# Patient Record
Sex: Female | Born: 1955 | Race: White | Hispanic: No | Marital: Single | Smoking: Former smoker
Health system: Southern US, Community
[De-identification: ages and names within clinical notes are randomized; demographics above are authoritative.]

## PROBLEM LIST (undated history)

## (undated) DIAGNOSIS — G4730 Sleep apnea, unspecified: Secondary | ICD-10-CM

## (undated) DIAGNOSIS — J449 Chronic obstructive pulmonary disease, unspecified: Secondary | ICD-10-CM

## (undated) DIAGNOSIS — J45909 Unspecified asthma, uncomplicated: Secondary | ICD-10-CM

## (undated) DIAGNOSIS — K863 Pseudocyst of pancreas: Secondary | ICD-10-CM

## (undated) DIAGNOSIS — F319 Bipolar disorder, unspecified: Secondary | ICD-10-CM

## (undated) DIAGNOSIS — K219 Gastro-esophageal reflux disease without esophagitis: Secondary | ICD-10-CM

## (undated) DIAGNOSIS — J869 Pyothorax without fistula: Secondary | ICD-10-CM

## (undated) DIAGNOSIS — F329 Major depressive disorder, single episode, unspecified: Secondary | ICD-10-CM

## (undated) DIAGNOSIS — T7840XA Allergy, unspecified, initial encounter: Secondary | ICD-10-CM

## (undated) DIAGNOSIS — N3010 Interstitial cystitis (chronic) without hematuria: Secondary | ICD-10-CM

## (undated) DIAGNOSIS — F32A Depression, unspecified: Secondary | ICD-10-CM

## (undated) DIAGNOSIS — I10 Essential (primary) hypertension: Secondary | ICD-10-CM

## (undated) DIAGNOSIS — J189 Pneumonia, unspecified organism: Secondary | ICD-10-CM

## (undated) DIAGNOSIS — F4310 Post-traumatic stress disorder, unspecified: Secondary | ICD-10-CM

## (undated) DIAGNOSIS — E119 Type 2 diabetes mellitus without complications: Secondary | ICD-10-CM

## (undated) HISTORY — DX: Pseudocyst of pancreas: K86.3

## (undated) HISTORY — DX: Interstitial cystitis (chronic) without hematuria: N30.10

## (undated) HISTORY — DX: Sleep apnea, unspecified: G47.30

## (undated) HISTORY — DX: Pneumonia, unspecified organism: J18.9

## (undated) HISTORY — PX: LEG SURGERY: SHX1003

## (undated) HISTORY — PX: CHEST SURGERY: SHX595

## (undated) HISTORY — DX: Depression, unspecified: F32.A

## (undated) HISTORY — DX: Type 2 diabetes mellitus without complications: E11.9

## (undated) HISTORY — DX: Gastro-esophageal reflux disease without esophagitis: K21.9

## (undated) HISTORY — PX: CHOLECYSTECTOMY: SHX55

## (undated) HISTORY — DX: Essential (primary) hypertension: I10

## (undated) HISTORY — DX: Allergy, unspecified, initial encounter: T78.40XA

## (undated) HISTORY — DX: Major depressive disorder, single episode, unspecified: F32.9

## (undated) HISTORY — DX: Unspecified asthma, uncomplicated: J45.909

---

## 1998-08-09 ENCOUNTER — Emergency Department (HOSPITAL_COMMUNITY): Admission: EM | Admit: 1998-08-09 | Discharge: 1998-08-09 | Payer: Self-pay

## 1998-09-29 ENCOUNTER — Emergency Department (HOSPITAL_COMMUNITY): Admission: EM | Admit: 1998-09-29 | Discharge: 1998-09-29 | Payer: Self-pay

## 1999-06-10 ENCOUNTER — Other Ambulatory Visit: Admission: RE | Admit: 1999-06-10 | Discharge: 1999-06-10 | Payer: Self-pay | Admitting: Family Medicine

## 2000-06-29 ENCOUNTER — Encounter: Payer: Self-pay | Admitting: Urology

## 2000-07-06 ENCOUNTER — Ambulatory Visit (HOSPITAL_COMMUNITY): Admission: RE | Admit: 2000-07-06 | Discharge: 2000-07-06 | Payer: Self-pay | Admitting: Urology

## 2000-09-12 ENCOUNTER — Other Ambulatory Visit: Admission: RE | Admit: 2000-09-12 | Discharge: 2000-09-12 | Payer: Self-pay | Admitting: Obstetrics and Gynecology

## 2000-10-22 ENCOUNTER — Emergency Department (HOSPITAL_COMMUNITY): Admission: EM | Admit: 2000-10-22 | Discharge: 2000-10-22 | Payer: Self-pay | Admitting: Emergency Medicine

## 2004-02-19 ENCOUNTER — Ambulatory Visit (HOSPITAL_BASED_OUTPATIENT_CLINIC_OR_DEPARTMENT_OTHER): Admission: RE | Admit: 2004-02-19 | Discharge: 2004-02-19 | Payer: Self-pay | Admitting: Urology

## 2004-02-19 ENCOUNTER — Ambulatory Visit (HOSPITAL_COMMUNITY): Admission: RE | Admit: 2004-02-19 | Discharge: 2004-02-19 | Payer: Self-pay | Admitting: Urology

## 2004-12-29 ENCOUNTER — Ambulatory Visit: Payer: Self-pay | Admitting: Family Medicine

## 2005-09-09 ENCOUNTER — Ambulatory Visit: Payer: Self-pay | Admitting: Family Medicine

## 2005-10-27 ENCOUNTER — Ambulatory Visit: Payer: Self-pay | Admitting: Family Medicine

## 2005-10-29 ENCOUNTER — Ambulatory Visit: Payer: Self-pay | Admitting: Family Medicine

## 2005-11-10 ENCOUNTER — Ambulatory Visit: Payer: Self-pay | Admitting: Family Medicine

## 2005-11-17 ENCOUNTER — Ambulatory Visit: Payer: Self-pay | Admitting: Family Medicine

## 2005-12-30 ENCOUNTER — Ambulatory Visit: Payer: Self-pay | Admitting: Family Medicine

## 2006-08-11 ENCOUNTER — Ambulatory Visit: Payer: Self-pay | Admitting: Family Medicine

## 2007-03-07 ENCOUNTER — Ambulatory Visit: Payer: Self-pay | Admitting: Family Medicine

## 2007-05-04 ENCOUNTER — Ambulatory Visit: Payer: Self-pay | Admitting: Family Medicine

## 2007-06-27 ENCOUNTER — Telehealth: Payer: Self-pay | Admitting: *Deleted

## 2007-07-20 ENCOUNTER — Telehealth: Payer: Self-pay | Admitting: Family Medicine

## 2007-09-02 DIAGNOSIS — J309 Allergic rhinitis, unspecified: Secondary | ICD-10-CM | POA: Insufficient documentation

## 2007-09-02 DIAGNOSIS — F329 Major depressive disorder, single episode, unspecified: Secondary | ICD-10-CM

## 2007-09-02 DIAGNOSIS — N3010 Interstitial cystitis (chronic) without hematuria: Secondary | ICD-10-CM | POA: Insufficient documentation

## 2007-09-02 DIAGNOSIS — K219 Gastro-esophageal reflux disease without esophagitis: Secondary | ICD-10-CM

## 2007-09-02 DIAGNOSIS — J45909 Unspecified asthma, uncomplicated: Secondary | ICD-10-CM

## 2007-09-02 DIAGNOSIS — I10 Essential (primary) hypertension: Secondary | ICD-10-CM

## 2007-12-13 ENCOUNTER — Telehealth: Payer: Self-pay | Admitting: Family Medicine

## 2008-02-13 ENCOUNTER — Telehealth: Payer: Self-pay | Admitting: Family Medicine

## 2008-03-21 ENCOUNTER — Ambulatory Visit: Payer: Self-pay | Admitting: Family Medicine

## 2008-03-21 DIAGNOSIS — G4700 Insomnia, unspecified: Secondary | ICD-10-CM

## 2008-03-25 LAB — CONVERTED CEMR LAB
ALT: 16 units/L (ref 0–35)
AST: 24 units/L (ref 0–37)
Albumin: 3.4 g/dL — ABNORMAL LOW (ref 3.5–5.2)
Alkaline Phosphatase: 78 units/L (ref 39–117)
BUN: 11 mg/dL (ref 6–23)
Basophils Absolute: 0 10*3/uL (ref 0.0–0.1)
Basophils Relative: 0.8 % (ref 0.0–1.0)
Bilirubin, Direct: 0.1 mg/dL (ref 0.0–0.3)
CO2: 31 meq/L (ref 19–32)
Calcium: 10.1 mg/dL (ref 8.4–10.5)
Chloride: 107 meq/L (ref 96–112)
Cholesterol: 201 mg/dL (ref 0–200)
Creatinine, Ser: 0.9 mg/dL (ref 0.4–1.2)
Direct LDL: 151.4 mg/dL
Eosinophils Absolute: 0.1 10*3/uL (ref 0.0–0.7)
Eosinophils Relative: 2.6 % (ref 0.0–5.0)
GFR calc Af Amer: 86 mL/min
GFR calc non Af Amer: 71 mL/min
Glucose, Bld: 87 mg/dL (ref 70–99)
HCT: 39.3 % (ref 36.0–46.0)
HDL: 46.4 mg/dL (ref >39.0)
Hemoglobin: 13.2 g/dL (ref 12.0–15.0)
Lymphocytes Relative: 33.5 % (ref 12.0–46.0)
MCHC: 33.7 g/dL (ref 30.0–36.0)
MCV: 82.2 fL (ref 78.0–100.0)
Monocytes Absolute: 0.5 10*3/uL (ref 0.1–1.0)
Monocytes Relative: 8.8 % (ref 3.0–12.0)
Neutro Abs: 3.2 10*3/uL (ref 1.4–7.7)
Neutrophils Relative %: 54.3 % (ref 43.0–77.0)
Platelets: 349 10*3/uL (ref 150–400)
Potassium: 4.8 meq/L (ref 3.5–5.1)
RBC: 4.78 M/uL (ref 3.87–5.11)
RDW: 13.7 % (ref 11.5–14.6)
Sodium: 146 meq/L — ABNORMAL HIGH (ref 135–145)
TSH: 2.56 microintl units/mL (ref 0.35–5.50)
Total Bilirubin: 0.5 mg/dL (ref 0.3–1.2)
Total CHOL/HDL Ratio: 4.3
Total Protein: 7.8 g/dL (ref 6.0–8.3)
Triglycerides: 65 mg/dL (ref 0–149)
VLDL: 13 mg/dL (ref 0–40)
WBC: 5.7 10*3/uL (ref 4.5–10.5)

## 2008-08-14 ENCOUNTER — Encounter: Payer: Self-pay | Admitting: Family Medicine

## 2008-09-05 ENCOUNTER — Ambulatory Visit: Payer: Self-pay | Admitting: Family Medicine

## 2008-09-05 DIAGNOSIS — R609 Edema, unspecified: Secondary | ICD-10-CM | POA: Insufficient documentation

## 2008-09-06 LAB — CONVERTED CEMR LAB
ALT: 18 units/L (ref 0–35)
AST: 31 units/L (ref 0–37)
Albumin: 3.8 g/dL (ref 3.5–5.2)
Alkaline Phosphatase: 76 units/L (ref 39–117)
BUN: 17 mg/dL (ref 6–23)
Basophils Absolute: 0 10*3/uL (ref 0.0–0.1)
Basophils Relative: 0.7 % (ref 0.0–3.0)
Bilirubin, Direct: 0.1 mg/dL (ref 0.0–0.3)
CO2: 32 meq/L (ref 19–32)
Calcium: 9.9 mg/dL (ref 8.4–10.5)
Chloride: 100 meq/L (ref 96–112)
Creatinine, Ser: 1.1 mg/dL (ref 0.4–1.2)
Eosinophils Absolute: 0.1 10*3/uL (ref 0.0–0.7)
Eosinophils Relative: 1.9 % (ref 0.0–5.0)
GFR calc Af Amer: 68 mL/min
GFR calc non Af Amer: 56 mL/min
Glucose, Bld: 88 mg/dL (ref 70–99)
HCT: 41.1 % (ref 36.0–46.0)
Hemoglobin: 14 g/dL (ref 12.0–15.0)
Lymphocytes Relative: 30.4 % (ref 12.0–46.0)
MCHC: 34.1 g/dL (ref 30.0–36.0)
MCV: 83.6 fL (ref 78.0–100.0)
Monocytes Absolute: 0.7 10*3/uL (ref 0.1–1.0)
Monocytes Relative: 12.4 % — ABNORMAL HIGH (ref 3.0–12.0)
Neutro Abs: 3 10*3/uL (ref 1.4–7.7)
Neutrophils Relative %: 54.6 % (ref 43.0–77.0)
Platelets: 328 10*3/uL (ref 150–400)
Potassium: 3.9 meq/L (ref 3.5–5.1)
RBC: 4.91 M/uL (ref 3.87–5.11)
RDW: 13.2 % (ref 11.5–14.6)
Sodium: 145 meq/L (ref 135–145)
TSH: 2.28 microintl units/mL (ref 0.35–5.50)
Total Bilirubin: 0.5 mg/dL (ref 0.3–1.2)
Total Protein: 8.9 g/dL — ABNORMAL HIGH (ref 6.0–8.3)
WBC: 5.5 10*3/uL (ref 4.5–10.5)

## 2008-11-27 ENCOUNTER — Encounter: Payer: Self-pay | Admitting: Family Medicine

## 2009-01-21 ENCOUNTER — Telehealth: Payer: Self-pay | Admitting: Family Medicine

## 2009-04-14 ENCOUNTER — Telehealth: Payer: Self-pay | Admitting: Family Medicine

## 2009-04-22 ENCOUNTER — Telehealth: Payer: Self-pay | Admitting: Family Medicine

## 2009-04-25 ENCOUNTER — Telehealth: Payer: Self-pay | Admitting: Family Medicine

## 2009-06-02 ENCOUNTER — Ambulatory Visit: Payer: Self-pay | Admitting: Family Medicine

## 2009-06-02 DIAGNOSIS — M722 Plantar fascial fibromatosis: Secondary | ICD-10-CM

## 2010-06-23 ENCOUNTER — Ambulatory Visit: Payer: Self-pay | Admitting: Family Medicine

## 2010-06-24 LAB — CONVERTED CEMR LAB
ALT: 13 units/L (ref 0–35)
AST: 18 units/L (ref 0–37)
Albumin: 3.6 g/dL (ref 3.5–5.2)
Alkaline Phosphatase: 72 units/L (ref 39–117)
BUN: 16 mg/dL (ref 6–23)
Basophils Absolute: 0 10*3/uL (ref 0.0–0.1)
Basophils Relative: 0.5 % (ref 0.0–3.0)
Bilirubin, Direct: 0.1 mg/dL (ref 0.0–0.3)
CO2: 31 meq/L (ref 19–32)
Calcium: 9.7 mg/dL (ref 8.4–10.5)
Chloride: 109 meq/L (ref 96–112)
Cholesterol: 190 mg/dL (ref 0–200)
Creatinine, Ser: 0.7 mg/dL (ref 0.4–1.2)
Eosinophils Absolute: 0.1 10*3/uL (ref 0.0–0.7)
Eosinophils Relative: 1.8 % (ref 0.0–5.0)
GFR calc non Af Amer: 91.82 mL/min (ref >60)
Glucose, Bld: 89 mg/dL (ref 70–99)
HCT: 39 % (ref 36.0–46.0)
HDL: 49.7 mg/dL (ref >39.00)
Hemoglobin: 13 g/dL (ref 12.0–15.0)
LDL Cholesterol: 129 mg/dL — ABNORMAL HIGH (ref 0–99)
Lymphocytes Relative: 33 % (ref 12.0–46.0)
Lymphs Abs: 2.4 10*3/uL (ref 0.7–4.0)
MCHC: 33.4 g/dL (ref 30.0–36.0)
MCV: 83.4 fL (ref 78.0–100.0)
Monocytes Absolute: 0.6 10*3/uL (ref 0.1–1.0)
Monocytes Relative: 8.1 % (ref 3.0–12.0)
Neutro Abs: 4.1 10*3/uL (ref 1.4–7.7)
Neutrophils Relative %: 56.6 % (ref 43.0–77.0)
Platelets: 297 10*3/uL (ref 150.0–400.0)
Potassium: 4.6 meq/L (ref 3.5–5.1)
RBC: 4.67 M/uL (ref 3.87–5.11)
RDW: 14.9 % — ABNORMAL HIGH (ref 11.5–14.6)
Sodium: 143 meq/L (ref 135–145)
TSH: 3.41 microintl units/mL (ref 0.35–5.50)
Total Bilirubin: 0.3 mg/dL (ref 0.3–1.2)
Total CHOL/HDL Ratio: 4
Total Protein: 7.9 g/dL (ref 6.0–8.3)
Triglycerides: 59 mg/dL (ref 0.0–149.0)
VLDL: 11.8 mg/dL (ref 0.0–40.0)
Vitamin B-12: 339 pg/mL (ref 211–911)
WBC: 7.2 10*3/uL (ref 4.5–10.5)

## 2011-01-07 NOTE — Assessment & Plan Note (Signed)
Summary: med check/refill/cjr pt rsc due to transportation/njr   Vital Signs:  Patient profile:   55 year old female Weight:      202 pounds Pulse rate:   56 / minute Pulse rhythm:   regular BP sitting:   130 / 90  (left arm) Cuff size:   regular  Vitals Entered By: Raechel Ache, RN (June 23, 2010 9:48 AM) CC: Out of meds x 2 months. C/o pain in back, legs, feet and can't feel toes.   History of Present Illness: Here to follow up after a little over a year. She is doing well, her asthma is well controlled. She still has some chronic low back pain. She also mentions some numbness in the feet for several months. She is fasting today.   Allergies: 1)  ! Penicillin V Potassium (Penicillin V Potassium) 2)  ! Aspirin 3)  ! Sulfamethoxazole (Sulfamethoxazole) 4)  ! Betadine (Povidone-Iodine) 5)  ! Adhesive Bandages (Adhesive Bandages)  Past History:  Past Medical History: Allergic rhinitis Asthma Depression, sees Dr. Lum Babe (psychiatry) in [facility] GERD Hypertension interstitial cystitis pneumonia  Past Surgical History: Reviewed history from 03/21/2008 and no changes required. Denies surgical history  Review of Systems  The patient denies anorexia, fever, weight loss, weight gain, vision loss, decreased hearing, hoarseness, chest pain, syncope, dyspnea on exertion, peripheral edema, prolonged cough, headaches, hemoptysis, abdominal pain, melena, hematochezia, severe indigestion/heartburn, hematuria, incontinence, genital sores, muscle weakness, suspicious skin lesions, transient blindness, difficulty walking, depression, unusual weight change, abnormal bleeding, enlarged lymph nodes, angioedema, breast masses, and testicular masses.    Physical Exam  General:  overweight-appearing.   Neck:  No deformities, masses, or tenderness noted. Lungs:  Normal respiratory effort, chest expands symmetrically. Lungs are clear to auscultation, no crackles or  wheezes. Heart:  Normal rate and regular rhythm. S1 and S2 normal without gallop, murmur, click, rub or other extra sounds. Extremities:  no edema Neurologic:  alert & oriented X3 and gait normal.     Impression & Recommendations:  Problem # 1:  LEG EDEMA (ICD-782.3)  Her updated medication list for this problem includes:    Diovan Hct 160-25 Mg Tabs (Valsartan-hydrochlorothiazide) .Marland Kitchen... 1 by mouth once daily    Lasix 20 Mg Tabs (Furosemide) ..... Once daily  Problem # 2:  HYPERTENSION (ICD-401.9)  Her updated medication list for this problem includes:    Diovan Hct 160-25 Mg Tabs (Valsartan-hydrochlorothiazide) .Marland Kitchen... 1 by mouth once daily    Amlodipine Besylate 5 Mg Tabs (Amlodipine besylate) .Marland Kitchen... 1 by mouth every day    Lasix 20 Mg Tabs (Furosemide) ..... Once daily  Orders: Prescription Created Electronically [phone]) Venipuncture [id number]) TLB-Lipid Panel (80061-LIPID) TLB-BMP (Basic Metabolic Panel-BMET) (80048-METABOL) TLB-CBC Platelet - w/Differential (85025-CBCD) TLB-Hepatic/Liver Function Pnl (80076-HEPATIC) TLB-TSH (Thyroid Stimulating Hormone) (84443-TSH) TLB-B12, Serum-Total ONLY (09811-B14)  Problem # 3:  GERD (ICD-530.81)  Her updated medication list for this problem includes:    Nexium 40 Mg Cpdr (Esomeprazole magnesium) .Marland Kitchen... 1 by mouth once daily  Problem # 4:  ASTHMA (ICD-493.90)  Her updated medication list for this problem includes:    Singulair 10 Mg Tabs (Montelukast sodium) .Marland Kitchen... Take 1 tablet by mouth once a day    Advair Hfa 115-21 Mcg/act Aero (Fluticasone-salmeterol) .Marland Kitchen..Marland Kitchen Two times a day    Albuterol-ipratropium 2.5-0.5 Mg/76ml Soln (Albuterol-ipratropium) .Marland Kitchen... As needed  nebulizer    Terbutaline Sulfate 1 Mg/ml Soln (Terbutaline sulfate) .Marland Kitchen... As needed for nebulizer  Problem # 5:  ALLERGIC RHINITIS (ICD-477.9)  Her  updated medication list for this problem includes:    Nasonex 50 Mcg/act Susp (Mometasone furoate) .Marland Kitchen... 2 sprays each nostril  once daily  Complete Medication List: 1)  Diovan Hct 160-25 Mg Tabs (Valsartan-hydrochlorothiazide) .Marland Kitchen.. 1 by mouth once daily 2)  Halobetasol Propionate 0.05 % Crea (Halobetasol propionate) .... As needed 3)  Vicodin 5-500 Mg Tabs (Hydrocodone-acetaminophen) .Marland Kitchen.. 1 every 6 hours as needed pain 4)  Zazole 0.4 % Crea (Terconazole) .... As needed 5)  Singulair 10 Mg Tabs (Montelukast sodium) .... Take 1 tablet by mouth once a day 6)  Nasonex 50 Mcg/act Susp (Mometasone furoate) .... 2 sprays each nostril once daily 7)  Advair Hfa 115-21 Mcg/act Aero (Fluticasone-salmeterol) .... Two times a day 8)  Nexium 40 Mg Cpdr (Esomeprazole magnesium) .Marland Kitchen.. 1 by mouth once daily 9)  Amlodipine Besylate 5 Mg Tabs (Amlodipine besylate) .Marland Kitchen.. 1 by mouth every day 10)  Albuterol-ipratropium 2.5-0.5 Mg/46ml Soln (Albuterol-ipratropium) .... As needed  nebulizer 11)  Ambien 10 Mg Tabs (Zolpidem tartrate) .Marland Kitchen.. 1 by mouth at bedtime 12)  Diclofenac Sodium 75 Mg Tbec (Diclofenac sodium) .... Two times a day 13)  Pamelor 50 Mg Caps (Nortriptyline hcl) .... At bedtime 14)  Vesicare 5 Mg Tabs (Solifenacin succinate) .Marland Kitchen.. 1 by mouth daily 15)  Lasix 20 Mg Tabs (Furosemide) .... Once daily 16)  Terbutaline Sulfate 1 Mg/ml Soln (Terbutaline sulfate) .... As needed for nebulizer 17)  Zoloft 100 Mg Tabs (Sertraline hcl) .Marland Kitchen.. 1 once daily  Patient Instructions: 1)  get labs today Prescriptions: TERBUTALINE SULFATE 1 MG/ML SOLN (TERBUTALINE SULFATE) as needed for nebulizer  #30 x 11   Entered and Authorized by:   Nelwyn Salisbury MD   Signed by:   Nelwyn Salisbury MD on 06/23/2010   Method used:   Electronically to        [facility]. [phone]* (retail)       [address]       [place], [place]  [address]       Ph: [phone] or [phone]       Fax: [phone]   RxID:   [id number] LASIX 20 MG TABS (FUROSEMIDE) once daily  #30 x 11   Entered and Authorized by:   Nelwyn Salisbury  MD   Signed by:   Nelwyn Salisbury MD on 06/23/2010   Method used:   Electronically to        [facility]. [phone]* (retail)       [address]       [place], [place]  [address]       Ph: [phone] or [phone]       Fax: [phone]   RxID:   [id number] VESICARE 5 MG TABS (SOLIFENACIN SUCCINATE) 1 by mouth daily  #30 x 11   Entered and Authorized by:   Nelwyn Salisbury MD   Signed by:   Nelwyn Salisbury MD on 06/23/2010   Method used:   Electronically to        [facility]. [phone]* (retail)       [address]       [place], [place]  [address]       Ph: [phone] or [phone]       Fax: [phone]   RxID:   [id number] DICLOFENAC SODIUM 75 MG  TBEC (DICLOFENAC SODIUM) two  times a day  #60 x 11   Entered and Authorized by:   Nelwyn Salisbury MD   Signed by:   Nelwyn Salisbury MD on 06/23/2010   Method used:   Electronically to        [facility]. [phone]* (retail)       [address]       [place], [place]  [address]       Ph: [phone] or [phone]       Fax: [phone]   RxID:   [id number] AMLODIPINE BESYLATE 5 MG  TABS (AMLODIPINE BESYLATE) 1 by mouth every day  #30 x 11   Entered and Authorized by:   Nelwyn Salisbury MD   Signed by:   Nelwyn Salisbury MD on 06/23/2010   Method used:   Electronically to        [facility] [phone]* (retail)       [address]       [place], [place]  [address]       Ph: [phone] or [phone]       Fax: [phone]   RxID:   [id number] NEXIUM 40 MG  CPDR (ESOMEPRAZOLE MAGNESIUM) 1 by mouth once daily  #30 x 11   Entered and Authorized by:   Nelwyn Salisbury MD   Signed by:   Nelwyn Salisbury MD on 06/23/2010   Method used:   Electronically to        [facility]. [phone]* (retail)       [address]       [place], [place]   [address]       Ph: [phone] or [phone]       Fax: [phone]   RxID:   [id number] ADVAIR HFA 115-21 MCG/ACT  AERO (FLUTICASONE-SALMETEROL) two times a day  #30 x 11   Entered and Authorized by:   Nelwyn Salisbury MD   Signed by:   Nelwyn Salisbury MD on 06/23/2010   Method used:   Electronically to        [facility]. [phone]* (retail)       [address]       [place], [place]  [address]       Ph: [phone] or [phone]       Fax: [phone]   RxID:   [id number] NASONEX 50 MCG/ACT  SUSP (MOMETASONE FUROATE) 2 sprays each nostril once daily  #30 x 11   Entered and Authorized by:   Nelwyn Salisbury MD   Signed by:   Nelwyn Salisbury MD on 06/23/2010   Method used:   Electronically to        [facility]. [phone]* (retail)       [address]       [place], [place]  [address]       Ph: [phone] or [phone]       Fax: [phone]   RxID:   [id number] SINGULAIR 10 MG  TABS (MONTELUKAST SODIUM) Take 1 tablet by mouth once a day  #30 x 11   Entered and Authorized by:   Nelwyn Salisbury MD   Signed by:   Nelwyn Salisbury MD on 06/23/2010   Method used:  Electronically to        [facility]. [phone]* (retail)       [address]       [place], [place]  [address]       Ph: [phone] or [phone]       Fax: [phone]   RxID:   [id number] DIOVAN HCT 160-25 MG TABS (VALSARTAN-HYDROCHLOROTHIAZIDE) 1 by mouth once daily  #30 x 11   Entered and Authorized by:   Nelwyn Salisbury MD   Signed by:   Nelwyn Salisbury MD on 06/23/2010   Method used:   Electronically to        [facility] [phone]* (retail)       [address]       [place], [place]  [address]       Ph: [phone] or [phone]       Fax: [phone]   RxID:   [id number]

## 2011-04-23 NOTE — Op Note (Signed)
[name], Isabel [name]                       ACCOUNT NO.:  [id number]   MEDICAL RECORD NO.:  [id number]                   PATIENT TYPE:  AMB   LOCATION:  [facility]                                 FACILITY:  [facility]   PHYSICIAN:  Valetta Fuller, M.D.               DATE OF BIRTH:  02-13-1956   DATE OF PROCEDURE:  02/19/2004  DATE OF DISCHARGE:                                 OPERATIVE REPORT   PREOPERATIVE DIAGNOSIS:  Interstitial cystitis.   POSTOPERATIVE DIAGNOSIS:  Interstitial cystitis.   PROCEDURE PERFORMED:  1. Cystoscopy.  2. Urethral dilation.  3. Hydraulic overdistention of the bladder.  4. Instillation of Clorpactin.   SURGEON:  Valetta Fuller, M.D.   ANESTHESIA:  General.   INDICATIONS:  [name] is a 55 year old female with a longstanding  history of interstitial cystitis.  She has had several hydraulic  overdistentions of the bladder in the past.  She has been noted to have  significant glomerular hemorrhaging on several occasions.  She has responded  well to previous hydraulic overdistentions of her bladder.  She recently  came in with an increased flare-up of her interstitial cystitis and  therefore, requested a repeat hydraulic overdistention.  She is here for  that now.   TECHNIQUE AND FINDINGS:  The patient was brought to the operating room where  she had successful induction of general anesthesia.  She was prepped and  draped in the usual manner.  Urethral dilation was performed to 34 [place].  She then underwent cystoscopy.  This failed to reveal anything significant.  She then underwent hydraulic overdistention of her bladder to approximately  80-100 cm of water pressure.  This was held for 5 minutes.  At the  completion, her capacity was measured at approximately 800 mL.  She did have  diffuse 3+ glomerular hemorrhaging.  At the completion of the hydraulic  overdistention, we instilled in her bladder under a standard concentration  under gravity drainage.   She appeared to tolerate the procedure well.  There  were no obvious complications.                                               Valetta Fuller, M.D.    DSG/MEDQ  D:  02/19/2004  T:  02/20/2004  Job:  [id number]

## 2011-04-23 NOTE — Op Note (Signed)
[facility]  Patient:    Isabel [name] [facility]                     MRN: [id number] Proc. Date: 07/06/00 Adm. Date:  [id number] Attending:  Thermon Leyland                           Operative Report  PREOPERATIVE DIAGNOSIS:  Interstitial cystitis.  POSTOPERATIVE DIAGNOSIS:  Interstitial cystitis.  PROCEDURE PERFORMED:  Urethral dilation, cystoscopy, hydraulic over distention of the bladder, instillation of Clorpactin, Pyridium, and Marcaine.  SURGEON:  Barron Alvine, M.D.  ANESTHESIA:  General.  INDICATIONS:  The patient is a 55 year old female with history of interstitial cystitis.  She has had two cystoscopies with hydrodistentions in the past which have worked very well for her symptoms.  Her last procedure was two years ago.  She has had recurrent symptoms without evidence of infection, and presents now for a repeat procedure.  DESCRIPTION OF PROCEDURE AND FINDINGS:  The patient was brought to the operating room where she had the successful induction of general anesthesia. She was placed in the lithotomy position, prepped and draped in the usual manner.  Urethral dilation was performed ________.  Cystoscopy revealed unremarkable bladder mucosa.  She underwent hydrodistention to 30 cmH2O for five minutes.  Capacity was noted at 700 cc.  There were some glomerulations as there did appear to be a marked ______ in overall bladder appearance compared to previous hydraulic over distentions.  Her bladder was subjected to repeat hydraulic over distention for two minutes.  We then instilled Clorpactin 100 cc at a time via gravity drainage to wash her bladder with. Approximately 500 cc of Clorpactin was utilized in this manner.  We then rinsed her bladder with normal saline.  Pyridium and Marcaine was placed in the bladder to help with postoperative recovery. DD:  07/06/00 TD:  07/07/00 Job: [id number]

## 2011-12-13 ENCOUNTER — Telehealth: Payer: Self-pay | Admitting: *Deleted

## 2011-12-13 NOTE — Telephone Encounter (Signed)
Wrong chart entered

## 2012-01-06 DIAGNOSIS — E119 Type 2 diabetes mellitus without complications: Secondary | ICD-10-CM | POA: Diagnosis not present

## 2012-01-06 DIAGNOSIS — N3946 Mixed incontinence: Secondary | ICD-10-CM | POA: Diagnosis not present

## 2012-01-06 DIAGNOSIS — M546 Pain in thoracic spine: Secondary | ICD-10-CM | POA: Diagnosis not present

## 2012-01-06 DIAGNOSIS — J449 Chronic obstructive pulmonary disease, unspecified: Secondary | ICD-10-CM | POA: Diagnosis not present

## 2012-02-03 DIAGNOSIS — I10 Essential (primary) hypertension: Secondary | ICD-10-CM | POA: Diagnosis not present

## 2012-02-03 DIAGNOSIS — E782 Mixed hyperlipidemia: Secondary | ICD-10-CM | POA: Diagnosis not present

## 2012-02-03 DIAGNOSIS — E559 Vitamin D deficiency, unspecified: Secondary | ICD-10-CM | POA: Diagnosis not present

## 2012-02-03 DIAGNOSIS — R609 Edema, unspecified: Secondary | ICD-10-CM | POA: Diagnosis not present

## 2012-02-03 DIAGNOSIS — M546 Pain in thoracic spine: Secondary | ICD-10-CM | POA: Diagnosis not present

## 2012-02-03 DIAGNOSIS — E119 Type 2 diabetes mellitus without complications: Secondary | ICD-10-CM | POA: Diagnosis not present

## 2012-02-29 DIAGNOSIS — J309 Allergic rhinitis, unspecified: Secondary | ICD-10-CM | POA: Diagnosis not present

## 2012-02-29 DIAGNOSIS — E559 Vitamin D deficiency, unspecified: Secondary | ICD-10-CM | POA: Diagnosis not present

## 2012-02-29 DIAGNOSIS — G4730 Sleep apnea, unspecified: Secondary | ICD-10-CM | POA: Diagnosis not present

## 2012-02-29 DIAGNOSIS — R0989 Other specified symptoms and signs involving the circulatory and respiratory systems: Secondary | ICD-10-CM | POA: Diagnosis not present

## 2012-03-28 DIAGNOSIS — G4733 Obstructive sleep apnea (adult) (pediatric): Secondary | ICD-10-CM | POA: Diagnosis not present

## 2012-05-17 DIAGNOSIS — F332 Major depressive disorder, recurrent severe without psychotic features: Secondary | ICD-10-CM | POA: Diagnosis not present

## 2012-06-13 DIAGNOSIS — E559 Vitamin D deficiency, unspecified: Secondary | ICD-10-CM | POA: Diagnosis not present

## 2012-06-13 DIAGNOSIS — G4710 Hypersomnia, unspecified: Secondary | ICD-10-CM | POA: Diagnosis not present

## 2012-06-13 DIAGNOSIS — R0609 Other forms of dyspnea: Secondary | ICD-10-CM | POA: Diagnosis not present

## 2012-06-13 DIAGNOSIS — G4730 Sleep apnea, unspecified: Secondary | ICD-10-CM | POA: Diagnosis not present

## 2012-06-13 DIAGNOSIS — R0989 Other specified symptoms and signs involving the circulatory and respiratory systems: Secondary | ICD-10-CM | POA: Diagnosis not present

## 2012-06-13 DIAGNOSIS — K219 Gastro-esophageal reflux disease without esophagitis: Secondary | ICD-10-CM | POA: Diagnosis not present

## 2012-06-14 DIAGNOSIS — G4710 Hypersomnia, unspecified: Secondary | ICD-10-CM | POA: Diagnosis not present

## 2012-06-14 DIAGNOSIS — E559 Vitamin D deficiency, unspecified: Secondary | ICD-10-CM | POA: Diagnosis not present

## 2012-06-14 DIAGNOSIS — R0609 Other forms of dyspnea: Secondary | ICD-10-CM | POA: Diagnosis not present

## 2012-06-14 DIAGNOSIS — K219 Gastro-esophageal reflux disease without esophagitis: Secondary | ICD-10-CM | POA: Diagnosis not present

## 2012-06-26 DIAGNOSIS — R209 Unspecified disturbances of skin sensation: Secondary | ICD-10-CM | POA: Diagnosis not present

## 2012-06-26 DIAGNOSIS — E119 Type 2 diabetes mellitus without complications: Secondary | ICD-10-CM | POA: Diagnosis not present

## 2012-06-26 DIAGNOSIS — G609 Hereditary and idiopathic neuropathy, unspecified: Secondary | ICD-10-CM | POA: Diagnosis not present

## 2012-06-26 DIAGNOSIS — M5137 Other intervertebral disc degeneration, lumbosacral region: Secondary | ICD-10-CM | POA: Diagnosis not present

## 2012-06-26 DIAGNOSIS — E782 Mixed hyperlipidemia: Secondary | ICD-10-CM | POA: Diagnosis not present

## 2012-06-26 DIAGNOSIS — I10 Essential (primary) hypertension: Secondary | ICD-10-CM | POA: Diagnosis not present

## 2012-06-26 DIAGNOSIS — E559 Vitamin D deficiency, unspecified: Secondary | ICD-10-CM | POA: Diagnosis not present

## 2012-06-26 DIAGNOSIS — J449 Chronic obstructive pulmonary disease, unspecified: Secondary | ICD-10-CM | POA: Diagnosis not present

## 2012-07-21 DIAGNOSIS — G4710 Hypersomnia, unspecified: Secondary | ICD-10-CM | POA: Diagnosis not present

## 2012-07-21 DIAGNOSIS — G4730 Sleep apnea, unspecified: Secondary | ICD-10-CM | POA: Diagnosis not present

## 2012-07-21 DIAGNOSIS — E559 Vitamin D deficiency, unspecified: Secondary | ICD-10-CM | POA: Diagnosis not present

## 2012-07-21 DIAGNOSIS — R0609 Other forms of dyspnea: Secondary | ICD-10-CM | POA: Diagnosis not present

## 2012-07-21 DIAGNOSIS — R0989 Other specified symptoms and signs involving the circulatory and respiratory systems: Secondary | ICD-10-CM | POA: Diagnosis not present

## 2012-07-21 DIAGNOSIS — K219 Gastro-esophageal reflux disease without esophagitis: Secondary | ICD-10-CM | POA: Diagnosis not present

## 2012-08-24 DIAGNOSIS — R112 Nausea with vomiting, unspecified: Secondary | ICD-10-CM | POA: Diagnosis not present

## 2012-08-24 DIAGNOSIS — Z6829 Body mass index (BMI) 29.0-29.9, adult: Secondary | ICD-10-CM | POA: Diagnosis not present

## 2012-08-24 DIAGNOSIS — E119 Type 2 diabetes mellitus without complications: Secondary | ICD-10-CM | POA: Diagnosis not present

## 2012-08-24 DIAGNOSIS — J45909 Unspecified asthma, uncomplicated: Secondary | ICD-10-CM | POA: Diagnosis present

## 2012-08-24 DIAGNOSIS — F329 Major depressive disorder, single episode, unspecified: Secondary | ICD-10-CM | POA: Diagnosis present

## 2012-08-24 DIAGNOSIS — R9389 Abnormal findings on diagnostic imaging of other specified body structures: Secondary | ICD-10-CM | POA: Diagnosis not present

## 2012-08-24 DIAGNOSIS — R1011 Right upper quadrant pain: Secondary | ICD-10-CM | POA: Diagnosis not present

## 2012-08-24 DIAGNOSIS — Z87891 Personal history of nicotine dependence: Secondary | ICD-10-CM | POA: Diagnosis not present

## 2012-08-24 DIAGNOSIS — R1110 Vomiting, unspecified: Secondary | ICD-10-CM | POA: Diagnosis not present

## 2012-08-24 DIAGNOSIS — F411 Generalized anxiety disorder: Secondary | ICD-10-CM | POA: Diagnosis present

## 2012-08-24 DIAGNOSIS — F0390 Unspecified dementia without behavioral disturbance: Secondary | ICD-10-CM | POA: Diagnosis not present

## 2012-08-24 DIAGNOSIS — K862 Cyst of pancreas: Secondary | ICD-10-CM | POA: Diagnosis not present

## 2012-08-24 DIAGNOSIS — R109 Unspecified abdominal pain: Secondary | ICD-10-CM | POA: Diagnosis not present

## 2012-08-24 DIAGNOSIS — G4730 Sleep apnea, unspecified: Secondary | ICD-10-CM | POA: Diagnosis present

## 2012-08-24 DIAGNOSIS — R634 Abnormal weight loss: Secondary | ICD-10-CM | POA: Diagnosis present

## 2012-08-24 DIAGNOSIS — I10 Essential (primary) hypertension: Secondary | ICD-10-CM | POA: Diagnosis not present

## 2012-08-24 DIAGNOSIS — K859 Acute pancreatitis without necrosis or infection, unspecified: Secondary | ICD-10-CM | POA: Diagnosis not present

## 2012-08-24 DIAGNOSIS — N318 Other neuromuscular dysfunction of bladder: Secondary | ICD-10-CM | POA: Diagnosis not present

## 2012-08-24 DIAGNOSIS — R110 Nausea: Secondary | ICD-10-CM | POA: Diagnosis not present

## 2012-08-24 DIAGNOSIS — K863 Pseudocyst of pancreas: Secondary | ICD-10-CM | POA: Diagnosis present

## 2012-08-28 DIAGNOSIS — K862 Cyst of pancreas: Secondary | ICD-10-CM | POA: Diagnosis not present

## 2012-08-28 DIAGNOSIS — E119 Type 2 diabetes mellitus without complications: Secondary | ICD-10-CM | POA: Diagnosis not present

## 2012-08-28 DIAGNOSIS — K863 Pseudocyst of pancreas: Secondary | ICD-10-CM | POA: Diagnosis not present

## 2012-09-02 HISTORY — PX: OTHER SURGICAL HISTORY: SHX169

## 2012-09-11 DIAGNOSIS — J45909 Unspecified asthma, uncomplicated: Secondary | ICD-10-CM | POA: Diagnosis not present

## 2012-09-11 DIAGNOSIS — I10 Essential (primary) hypertension: Secondary | ICD-10-CM | POA: Diagnosis not present

## 2012-09-11 DIAGNOSIS — R32 Unspecified urinary incontinence: Secondary | ICD-10-CM | POA: Diagnosis not present

## 2012-09-11 DIAGNOSIS — E168 Other specified disorders of pancreatic internal secretion: Secondary | ICD-10-CM | POA: Diagnosis not present

## 2012-09-11 DIAGNOSIS — E119 Type 2 diabetes mellitus without complications: Secondary | ICD-10-CM | POA: Diagnosis not present

## 2012-09-15 DIAGNOSIS — I10 Essential (primary) hypertension: Secondary | ICD-10-CM | POA: Diagnosis not present

## 2012-09-15 DIAGNOSIS — E168 Other specified disorders of pancreatic internal secretion: Secondary | ICD-10-CM | POA: Diagnosis not present

## 2012-09-15 DIAGNOSIS — J45909 Unspecified asthma, uncomplicated: Secondary | ICD-10-CM | POA: Diagnosis not present

## 2012-09-15 DIAGNOSIS — E119 Type 2 diabetes mellitus without complications: Secondary | ICD-10-CM | POA: Diagnosis not present

## 2012-09-15 DIAGNOSIS — R32 Unspecified urinary incontinence: Secondary | ICD-10-CM | POA: Diagnosis not present

## 2012-09-19 DIAGNOSIS — E119 Type 2 diabetes mellitus without complications: Secondary | ICD-10-CM | POA: Diagnosis not present

## 2012-09-22 DIAGNOSIS — J45909 Unspecified asthma, uncomplicated: Secondary | ICD-10-CM | POA: Diagnosis not present

## 2012-09-22 DIAGNOSIS — R32 Unspecified urinary incontinence: Secondary | ICD-10-CM | POA: Diagnosis not present

## 2012-09-22 DIAGNOSIS — I10 Essential (primary) hypertension: Secondary | ICD-10-CM | POA: Diagnosis not present

## 2012-09-22 DIAGNOSIS — E119 Type 2 diabetes mellitus without complications: Secondary | ICD-10-CM | POA: Diagnosis not present

## 2012-09-22 DIAGNOSIS — E168 Other specified disorders of pancreatic internal secretion: Secondary | ICD-10-CM | POA: Diagnosis not present

## 2012-09-25 ENCOUNTER — Telehealth: Payer: Self-pay | Admitting: Family Medicine

## 2012-09-25 NOTE — Telephone Encounter (Signed)
Pt coming in for ov on 10/09/12. Pt has been in [facility]. If Dr [name] needs the records from pts hospital visit, pls call [phone] and ask for [facility].

## 2012-09-26 ENCOUNTER — Ambulatory Visit: Payer: Self-pay | Admitting: Family Medicine

## 2012-09-26 DIAGNOSIS — E168 Other specified disorders of pancreatic internal secretion: Secondary | ICD-10-CM | POA: Diagnosis not present

## 2012-09-26 DIAGNOSIS — I10 Essential (primary) hypertension: Secondary | ICD-10-CM | POA: Diagnosis not present

## 2012-09-26 DIAGNOSIS — R32 Unspecified urinary incontinence: Secondary | ICD-10-CM | POA: Diagnosis not present

## 2012-09-26 DIAGNOSIS — J45909 Unspecified asthma, uncomplicated: Secondary | ICD-10-CM | POA: Diagnosis not present

## 2012-09-26 DIAGNOSIS — E119 Type 2 diabetes mellitus without complications: Secondary | ICD-10-CM | POA: Diagnosis not present

## 2012-09-26 NOTE — Telephone Encounter (Signed)
I called and left voice message, yes we need copy of hospital visit and fax to [place].

## 2012-09-28 DIAGNOSIS — R32 Unspecified urinary incontinence: Secondary | ICD-10-CM | POA: Diagnosis not present

## 2012-09-28 DIAGNOSIS — E119 Type 2 diabetes mellitus without complications: Secondary | ICD-10-CM | POA: Diagnosis not present

## 2012-09-28 DIAGNOSIS — E168 Other specified disorders of pancreatic internal secretion: Secondary | ICD-10-CM | POA: Diagnosis not present

## 2012-09-28 DIAGNOSIS — J45909 Unspecified asthma, uncomplicated: Secondary | ICD-10-CM | POA: Diagnosis not present

## 2012-09-28 DIAGNOSIS — I10 Essential (primary) hypertension: Secondary | ICD-10-CM | POA: Diagnosis not present

## 2012-10-03 DIAGNOSIS — E119 Type 2 diabetes mellitus without complications: Secondary | ICD-10-CM | POA: Diagnosis not present

## 2012-10-03 DIAGNOSIS — I10 Essential (primary) hypertension: Secondary | ICD-10-CM | POA: Diagnosis not present

## 2012-10-03 DIAGNOSIS — R32 Unspecified urinary incontinence: Secondary | ICD-10-CM | POA: Diagnosis not present

## 2012-10-03 DIAGNOSIS — E168 Other specified disorders of pancreatic internal secretion: Secondary | ICD-10-CM | POA: Diagnosis not present

## 2012-10-03 DIAGNOSIS — J45909 Unspecified asthma, uncomplicated: Secondary | ICD-10-CM | POA: Diagnosis not present

## 2012-10-09 ENCOUNTER — Encounter: Payer: Self-pay | Admitting: Family Medicine

## 2012-10-09 ENCOUNTER — Ambulatory Visit (INDEPENDENT_AMBULATORY_CARE_PROVIDER_SITE_OTHER): Payer: Medicare Other | Admitting: Family Medicine

## 2012-10-09 VITALS — BP 112/70 | HR 65 | Temp 98.1°F | Wt 166.0 lb

## 2012-10-09 DIAGNOSIS — E1169 Type 2 diabetes mellitus with other specified complication: Secondary | ICD-10-CM

## 2012-10-09 DIAGNOSIS — E669 Obesity, unspecified: Secondary | ICD-10-CM

## 2012-10-09 DIAGNOSIS — G4730 Sleep apnea, unspecified: Secondary | ICD-10-CM | POA: Diagnosis not present

## 2012-10-09 DIAGNOSIS — I10 Essential (primary) hypertension: Secondary | ICD-10-CM

## 2012-10-09 DIAGNOSIS — E119 Type 2 diabetes mellitus without complications: Secondary | ICD-10-CM

## 2012-10-09 DIAGNOSIS — N318 Other neuromuscular dysfunction of bladder: Secondary | ICD-10-CM

## 2012-10-09 DIAGNOSIS — J45909 Unspecified asthma, uncomplicated: Secondary | ICD-10-CM | POA: Diagnosis not present

## 2012-10-09 DIAGNOSIS — N3281 Overactive bladder: Secondary | ICD-10-CM

## 2012-10-09 LAB — LIPID PANEL
Cholesterol: 110 mg/dL (ref 0–200)
HDL: 42.7 mg/dL (ref >39.00)
LDL Cholesterol: 59 mg/dL (ref 0–99)
Total CHOL/HDL Ratio: 3
Triglycerides: 44 mg/dL (ref 0.0–149.0)
VLDL: 8.8 mg/dL (ref 0.0–40.0)

## 2012-10-09 LAB — CBC WITH DIFFERENTIAL/PLATELET
Basophils Relative: 0.4 % (ref 0.0–3.0)
Eosinophils Relative: 2.4 % (ref 0.0–5.0)
HCT: 34.7 % — ABNORMAL LOW (ref 36.0–46.0)
Lymphs Abs: 1.7 10*3/uL (ref 0.7–4.0)
MCV: 79.2 fl (ref 78.0–100.0)
Monocytes Absolute: 0.5 10*3/uL (ref 0.1–1.0)
Monocytes Relative: 8 % (ref 3.0–12.0)
Neutrophils Relative %: 60.3 % (ref 43.0–77.0)
Platelets: 187 10*3/uL (ref 150.0–400.0)
RBC: 4.39 Mil/uL (ref 3.87–5.11)
WBC: 5.7 10*3/uL (ref 4.5–10.5)

## 2012-10-09 LAB — POCT URINALYSIS DIPSTICK
Bilirubin, UA: NEGATIVE
Blood, UA: NEGATIVE
Glucose, UA: NEGATIVE
Nitrite, UA: NEGATIVE
Protein, UA: NEGATIVE
Spec Grav, UA: >=1.03
Urobilinogen, UA: 0.2
pH, UA: 5

## 2012-10-09 LAB — HEPATIC FUNCTION PANEL
ALT: 10 U/L (ref 0–35)
AST: 16 U/L (ref 0–37)
Albumin: 3.2 g/dL — ABNORMAL LOW (ref 3.5–5.2)
Alkaline Phosphatase: 74 U/L (ref 39–117)
Bilirubin, Direct: 0.1 mg/dL (ref 0.0–0.3)
Total Bilirubin: 0.3 mg/dL (ref 0.3–1.2)
Total Protein: 7.5 g/dL (ref 6.0–8.3)

## 2012-10-09 LAB — BASIC METABOLIC PANEL
BUN: 12 mg/dL (ref 6–23)
CO2: 27 mEq/L (ref 19–32)
Calcium: 9.4 mg/dL (ref 8.4–10.5)
Chloride: 104 mEq/L (ref 96–112)
Creatinine, Ser: 0.6 mg/dL (ref 0.4–1.2)
GFR: 117.27 mL/min (ref >60.00)
Glucose, Bld: 141 mg/dL — ABNORMAL HIGH (ref 70–99)
Potassium: 4.9 mEq/L (ref 3.5–5.1)
Sodium: 139 mEq/L (ref 135–145)

## 2012-10-09 LAB — HEMOGLOBIN A1C: Hgb A1c MFr Bld: 9.2 % — ABNORMAL HIGH (ref 4.6–6.5)

## 2012-10-09 LAB — TSH: TSH: 1.23 u[IU]/mL (ref 0.35–5.50)

## 2012-10-09 LAB — MICROALBUMIN / CREATININE URINE RATIO
Microalb Creat Ratio: 0.9 mg/g (ref 0.0–30.0)
Microalb, Ur: 1.2 mg/dL (ref 0.0–1.9)

## 2012-10-09 MED ORDER — ZOLPIDEM TARTRATE 10 MG PO TABS: 10.0000 mg | ORAL_TABLET | Freq: Every evening | ORAL | Status: DC | PRN

## 2012-10-09 MED ORDER — MOMETASONE FUROATE 50 MCG/ACT NA SUSP: 2.0000 | Freq: Every day | NASAL | Status: DC

## 2012-10-09 MED ORDER — OXYBUTYNIN CHLORIDE ER 10 MG PO TB24: 10.0000 mg | ORAL_TABLET | Freq: Every day | ORAL | Status: DC

## 2012-10-09 MED ORDER — ESOMEPRAZOLE MAGNESIUM 40 MG PO CPDR: 40.0000 mg | DELAYED_RELEASE_CAPSULE | Freq: Every day | ORAL | Status: DC

## 2012-10-09 MED ORDER — OXYCODONE HCL 5 MG PO TABS: 5.0000 mg | ORAL_TABLET | ORAL | Status: DC | PRN

## 2012-10-09 MED ORDER — ROPINIROLE HCL 1 MG PO TABS: 1.0000 mg | ORAL_TABLET | Freq: Every day | ORAL | Status: DC

## 2012-10-09 MED ORDER — CETIRIZINE HCL 10 MG PO TABS: 10.0000 mg | ORAL_TABLET | Freq: Every day | ORAL | Status: DC

## 2012-10-09 MED ORDER — MONTELUKAST SODIUM 10 MG PO TABS: 10.0000 mg | ORAL_TABLET | Freq: Every day | ORAL | Status: DC

## 2012-10-09 MED ORDER — INSULIN ASPART PROT & ASPART (70-30 MIX) 100 UNIT/ML ~~LOC~~ SUSP: SUBCUTANEOUS | Status: DC

## 2012-10-09 MED ORDER — IMIPRAMINE HCL 50 MG PO TABS: 50.0000 mg | ORAL_TABLET | Freq: Every day | ORAL | Status: DC

## 2012-10-09 MED ORDER — DESVENLAFAXINE SUCCINATE ER 50 MG PO TB24: 50.0000 mg | ORAL_TABLET | Freq: Every day | ORAL | Status: DC

## 2012-10-09 MED ORDER — BUDESONIDE-FORMOTEROL FUMARATE 160-4.5 MCG/ACT IN AERO: 2.0000 | INHALATION_SPRAY | Freq: Two times a day (BID) | RESPIRATORY_TRACT | Status: DC

## 2012-10-09 MED ORDER — ALBUTEROL SULFATE HFA 108 (90 BASE) MCG/ACT IN AERS: 2.0000 | INHALATION_SPRAY | RESPIRATORY_TRACT | Status: DC | PRN

## 2012-10-09 NOTE — Progress Notes (Signed)
  Subjective:    Patient ID: Isabel [name], female    DOB: 10/25/1956, 56 y.o.   MRN: [id number]  HPI Here to re-establish after a 2 year absence. She had been seeing a PA by the name of Elizebeth Koller in [place] [phone]) for primary care. 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 She has lost a lot of weight. She is off all BP meds now, and her BP has been well controlled. She has developed diabetes and she is now using insulin. Her am fasting glucoses have been in the range of 90-120. I do not have any recent A1c values. She feels well except for decreased appetite, and she still has some abdominal pains for her surgery. It seems the surgery went well, and her surgeon Dr. Cherly Hensen has released her from his care.    Review of Systems  Constitutional: Positive for fatigue.  Respiratory: Negative.   Cardiovascular: Negative.   Gastrointestinal: Positive for nausea and abdominal pain. Negative for vomiting, diarrhea, constipation, blood in stool, abdominal distention and rectal pain.  Genitourinary: Negative.   Neurological: Negative.        Objective:   Physical Exam  Constitutional: She is oriented to person, place, and time. She appears well-developed and well-nourished. No distress.  Neck: No thyromegaly present.  Cardiovascular: Normal rate, regular rhythm, normal heart sounds and intact distal pulses.   Pulmonary/Chest: Effort normal and breath sounds normal.  Abdominal: Soft. Bowel sounds are normal. She exhibits no distension and no mass. There is no rebound and no guarding.       Mild diffuse tenderness. Her midline surgical site is healing well with only one small site which drains serous fluid off and on   Lymphadenopathy:    She has no cervical adenopathy.  Neurological: She is alert and oriented to person, place, and time.          Assessment & Plan:  She is doing well recovering  from her surgery. We will labs today including an A1c to monitor her diabetes.

## 2012-10-10 ENCOUNTER — Telehealth: Payer: Self-pay | Admitting: Family Medicine

## 2012-10-10 ENCOUNTER — Encounter: Payer: Self-pay | Admitting: Family Medicine

## 2012-10-10 MED ORDER — INSULIN ASPART PROT & ASPART (70-30 MIX) 100 UNIT/ML ~~LOC~~ SUSP: SUBCUTANEOUS | Status: DC

## 2012-10-10 NOTE — Telephone Encounter (Signed)
Pt was seen yesterday had blood work. Pt is out of insulin for tonight. Pt cousin said MD will decide on how much insulin base on results. [facility] Rosalita Levan

## 2012-10-10 NOTE — Progress Notes (Signed)
Quick Note:  I spoke with pt and she cannot take the Metformin. Per Dr. [name], pt can take 40 units of insulin in the morning and 30 units at night. Pt is okay with this. I put a copy of results in mail. ______

## 2012-10-10 NOTE — Telephone Encounter (Signed)
I spoke with pt and gave results. Dr. [name] did increase insulin to 40 units in the AM and 30 units in the PM. I sent new script e-scribe for insulin.

## 2012-10-13 DIAGNOSIS — R32 Unspecified urinary incontinence: Secondary | ICD-10-CM | POA: Diagnosis not present

## 2012-10-13 DIAGNOSIS — E168 Other specified disorders of pancreatic internal secretion: Secondary | ICD-10-CM | POA: Diagnosis not present

## 2012-10-13 DIAGNOSIS — J45909 Unspecified asthma, uncomplicated: Secondary | ICD-10-CM | POA: Diagnosis not present

## 2012-10-13 DIAGNOSIS — I10 Essential (primary) hypertension: Secondary | ICD-10-CM | POA: Diagnosis not present

## 2012-10-13 DIAGNOSIS — E119 Type 2 diabetes mellitus without complications: Secondary | ICD-10-CM | POA: Diagnosis not present

## 2012-10-23 ENCOUNTER — Telehealth: Payer: Self-pay | Admitting: Family Medicine

## 2012-10-23 NOTE — Telephone Encounter (Signed)
Caller: Michelle/; Phone: [phone]; Reason for Call: Imipram hcl 50mg  making pt disoriented and blood sugar dropping.  Please call Home Health nurse as soon as possible to discuss.

## 2012-10-23 NOTE — Telephone Encounter (Signed)
I spoke with pt and she wants to know what to take in place of the Imipramine?

## 2012-10-23 NOTE — Telephone Encounter (Signed)
Stop the Imipramine. Her insulin doses are too high, so decrease it back to what it was when she saw me (30 units in am and 20 units in pm)

## 2012-10-23 NOTE — Telephone Encounter (Signed)
She is very complicated and I think her bladder issues should be addressed by a Urologist. I will refer her to Urology ASAP

## 2012-10-23 NOTE — Addendum Note (Signed)
Addended by: Gershon Crane A on: 10/23/2012 05:18 PM   Modules accepted: Orders

## 2012-10-24 NOTE — Telephone Encounter (Signed)
I spoke with pt  

## 2012-11-10 ENCOUNTER — Telehealth: Payer: Self-pay | Admitting: Family Medicine

## 2012-11-10 NOTE — Telephone Encounter (Signed)
Refill request for Oxybutynin, Ropinirole & Pristiq. ( send to [facility] )

## 2012-11-14 MED ORDER — DESVENLAFAXINE SUCCINATE ER 50 MG PO TB24: 50.0000 mg | ORAL_TABLET | Freq: Every day | ORAL | Status: DC

## 2012-11-14 MED ORDER — ROPINIROLE HCL 1 MG PO TABS: 1.0000 mg | ORAL_TABLET | Freq: Every day | ORAL | Status: DC

## 2012-11-14 MED ORDER — OXYBUTYNIN CHLORIDE ER 10 MG PO TB24: 10.0000 mg | ORAL_TABLET | Freq: Every day | ORAL | Status: DC

## 2012-11-14 NOTE — Telephone Encounter (Signed)
I left a voice message for pt to return my call, to let me know if she wants a 90 day supply?

## 2012-11-14 NOTE — Telephone Encounter (Signed)
Okay to fill, per Dr. [name]. I did send scripts e-scribe.

## 2012-12-20 ENCOUNTER — Telehealth: Payer: Self-pay | Admitting: Family Medicine

## 2012-12-20 NOTE — Telephone Encounter (Signed)
Can we refill Zolpidem tartrate 10 mg and Furosemide 40? ( 90 day supply )

## 2012-12-21 MED ORDER — CETIRIZINE HCL 10 MG PO TABS: 10.0000 mg | ORAL_TABLET | Freq: Every day | ORAL | Status: DC

## 2012-12-21 MED ORDER — FUROSEMIDE 40 MG PO TABS: 40.0000 mg | ORAL_TABLET | Freq: Every day | ORAL | Status: DC

## 2012-12-21 MED ORDER — MONTELUKAST SODIUM 10 MG PO TABS: 10.0000 mg | ORAL_TABLET | Freq: Every day | ORAL | Status: DC

## 2012-12-21 MED ORDER — MOMETASONE FUROATE 50 MCG/ACT NA SUSP: 2.0000 | Freq: Every day | NASAL | Status: DC

## 2012-12-21 MED ORDER — ZOLPIDEM TARTRATE 10 MG PO TABS: 10.0000 mg | ORAL_TABLET | Freq: Every evening | ORAL | Status: DC | PRN

## 2012-12-21 NOTE — Telephone Encounter (Signed)
Ask her if she takes Lasix once or twice a day

## 2012-12-21 NOTE — Telephone Encounter (Signed)
Refill request for Cetrizine, Lasix, Montelukast, Nasonex, and Zolpidem. I did send in all scripts to [facility].

## 2012-12-21 NOTE — Telephone Encounter (Signed)
Pt takes Lasix once per day

## 2012-12-21 NOTE — Telephone Encounter (Signed)
Call in Lasix #30 with 11 rf, and Zolpidem #30 with 5 rf

## 2013-01-15 ENCOUNTER — Other Ambulatory Visit: Payer: Self-pay | Admitting: Family Medicine

## 2013-02-27 ENCOUNTER — Telehealth: Payer: Self-pay | Admitting: Family Medicine

## 2013-02-27 ENCOUNTER — Ambulatory Visit (INDEPENDENT_AMBULATORY_CARE_PROVIDER_SITE_OTHER): Payer: Medicare Other | Admitting: Family Medicine

## 2013-02-27 ENCOUNTER — Encounter (HOSPITAL_COMMUNITY): Payer: Self-pay | Admitting: *Deleted

## 2013-02-27 ENCOUNTER — Emergency Department (HOSPITAL_COMMUNITY)
Admission: EM | Admit: 2013-02-27 | Discharge: 2013-02-27 | Disposition: A | Discharge status: Home / Self Care | Payer: Medicare Other | Attending: Emergency Medicine | Admitting: Emergency Medicine

## 2013-02-27 ENCOUNTER — Encounter: Payer: Self-pay | Admitting: Family Medicine

## 2013-02-27 ENCOUNTER — Emergency Department (HOSPITAL_COMMUNITY): Payer: Medicare Other

## 2013-02-27 VITALS — BP 114/74 | HR 102 | Temp 99.0°F | Wt 148.0 lb

## 2013-02-27 DIAGNOSIS — Z9989 Dependence on other enabling machines and devices: Secondary | ICD-10-CM | POA: Diagnosis not present

## 2013-02-27 DIAGNOSIS — Z87448 Personal history of other diseases of urinary system: Secondary | ICD-10-CM | POA: Diagnosis not present

## 2013-02-27 DIAGNOSIS — G4730 Sleep apnea, unspecified: Secondary | ICD-10-CM | POA: Diagnosis not present

## 2013-02-27 DIAGNOSIS — L02219 Cutaneous abscess of trunk, unspecified: Secondary | ICD-10-CM | POA: Insufficient documentation

## 2013-02-27 DIAGNOSIS — Z9889 Other specified postprocedural states: Secondary | ICD-10-CM | POA: Insufficient documentation

## 2013-02-27 DIAGNOSIS — I10 Essential (primary) hypertension: Secondary | ICD-10-CM | POA: Diagnosis not present

## 2013-02-27 DIAGNOSIS — Y833 Surgical operation with formation of external stoma as the cause of abnormal reaction of the patient, or of later complication, without mention of misadventure at the time of the procedure: Secondary | ICD-10-CM | POA: Insufficient documentation

## 2013-02-27 DIAGNOSIS — L0390 Cellulitis, unspecified: Secondary | ICD-10-CM | POA: Diagnosis not present

## 2013-02-27 DIAGNOSIS — Z8719 Personal history of other diseases of the digestive system: Secondary | ICD-10-CM | POA: Diagnosis not present

## 2013-02-27 DIAGNOSIS — J45909 Unspecified asthma, uncomplicated: Secondary | ICD-10-CM | POA: Diagnosis not present

## 2013-02-27 DIAGNOSIS — F3289 Other specified depressive episodes: Secondary | ICD-10-CM | POA: Insufficient documentation

## 2013-02-27 DIAGNOSIS — Z79899 Other long term (current) drug therapy: Secondary | ICD-10-CM | POA: Insufficient documentation

## 2013-02-27 DIAGNOSIS — T8131XA Disruption of external operation (surgical) wound, not elsewhere classified, initial encounter: Secondary | ICD-10-CM | POA: Insufficient documentation

## 2013-02-27 DIAGNOSIS — Z8701 Personal history of pneumonia (recurrent): Secondary | ICD-10-CM | POA: Insufficient documentation

## 2013-02-27 DIAGNOSIS — L0291 Cutaneous abscess, unspecified: Secondary | ICD-10-CM

## 2013-02-27 DIAGNOSIS — E119 Type 2 diabetes mellitus without complications: Secondary | ICD-10-CM | POA: Insufficient documentation

## 2013-02-27 DIAGNOSIS — F329 Major depressive disorder, single episode, unspecified: Secondary | ICD-10-CM | POA: Insufficient documentation

## 2013-02-27 DIAGNOSIS — S31109A Unspecified open wound of abdominal wall, unspecified quadrant without penetration into peritoneal cavity, initial encounter: Secondary | ICD-10-CM | POA: Diagnosis not present

## 2013-02-27 DIAGNOSIS — Z794 Long term (current) use of insulin: Secondary | ICD-10-CM | POA: Diagnosis not present

## 2013-02-27 DIAGNOSIS — R109 Unspecified abdominal pain: Secondary | ICD-10-CM | POA: Insufficient documentation

## 2013-02-27 DIAGNOSIS — R1900 Intra-abdominal and pelvic swelling, mass and lump, unspecified site: Secondary | ICD-10-CM | POA: Diagnosis not present

## 2013-02-27 LAB — CBC WITH DIFFERENTIAL/PLATELET
Basophils Absolute: 0 10*3/uL (ref 0.0–0.1)
Eosinophils Absolute: 0 10*3/uL (ref 0.0–0.7)
Eosinophils Relative: 0 % (ref 0–5)
MCH: 26.1 pg (ref 26.0–34.0)
MCV: 77.8 fL — ABNORMAL LOW (ref 78.0–100.0)
Neutrophils Relative %: 67 % (ref 43–77)
Platelets: 227 10*3/uL (ref 150–400)
RBC: 4.68 MIL/uL (ref 3.87–5.11)
RDW: 14.8 % (ref 11.5–15.5)
WBC: 7.6 10*3/uL (ref 4.0–10.5)

## 2013-02-27 LAB — POCT I-STAT, CHEM 8
BUN: 8 mg/dL (ref 6–23)
Calcium, Ion: 1.21 mmol/L (ref 1.12–1.23)
Creatinine, Ser: 0.3 mg/dL — ABNORMAL LOW (ref 0.50–1.10)
Glucose, Bld: 340 mg/dL — ABNORMAL HIGH (ref 70–99)
TCO2: 24 mmol/L (ref 0–100)

## 2013-02-27 MED ORDER — CLINDAMYCIN HCL 150 MG PO CAPS: 300.0000 mg | ORAL_CAPSULE | Freq: Three times a day (TID) | ORAL | Status: DC

## 2013-02-27 MED ORDER — IOHEXOL 300 MG/ML  SOLN
100.0000 mL | Freq: Once | INTRAMUSCULAR | Status: AC | PRN
  Administered 2013-02-27: 100 mL via INTRAVENOUS

## 2013-02-27 MED ORDER — ONDANSETRON HCL 4 MG/2ML IJ SOLN
4.0000 mg | Freq: Once | INTRAMUSCULAR | Status: AC
  Administered 2013-02-27: 4 mg via INTRAVENOUS
  Filled 2013-02-27: qty 2

## 2013-02-27 MED ORDER — IOHEXOL 300 MG/ML  SOLN
50.0000 mL | Freq: Once | INTRAMUSCULAR | Status: AC | PRN
  Administered 2013-02-27: 50 mL via ORAL

## 2013-02-27 MED ORDER — CLINDAMYCIN PHOSPHATE 600 MG/50ML IV SOLN
600.0000 mg | Freq: Once | INTRAVENOUS | Status: AC
  Administered 2013-02-27: 600 mg via INTRAVENOUS
  Filled 2013-02-27: qty 50

## 2013-02-27 MED ORDER — SODIUM CHLORIDE 0.9 % IV BOLUS (SEPSIS)
500.0000 mL | Freq: Once | INTRAVENOUS | Status: AC
  Administered 2013-02-27: 500 mL via INTRAVENOUS

## 2013-02-27 MED ORDER — OXYCODONE-ACETAMINOPHEN 5-325 MG PO TABS: 1.0000 | ORAL_TABLET | Freq: Four times a day (QID) | ORAL | Status: DC | PRN

## 2013-02-27 NOTE — ED Provider Notes (Signed)
History     CSN: [id number]  Arrival date & time 02/27/13  1711   First MD Initiated Contact with Patient 02/27/13 1738      Chief Complaint  Patient presents with  . Abdominal Pain    (Consider location/radiation/quality/duration/timing/severity/associated sxs/prior treatment) Patient is a 57 y.o. female presenting with abdominal pain. The history is provided by the patient.  Abdominal Pain Associated symptoms: no chest pain, no diarrhea, no nausea, no shortness of breath and no vomiting    patient had surgery at [facility] a month or 2 ago for pancreatic pseudocyst. Today she was bending over and had a separation of the wound on her abdomen. She has had some increased pain. There's been some discharge. There is redness that is new. No nausea or vomiting. No fevers. She was sent in by her primary care Dr. for further evaluation. Surgeries done at [facility]  Past Medical History  Diagnosis Date  . Allergy   . Asthma   . Depression   . GERD (gastroesophageal reflux disease)   . Hypertension   . Cystitis, interstitial   . Pneumonia   . Diabetes mellitus without complication   . Pancreatic pseudocyst   . Sleep apnea     wears a CPAP at night     Past Surgical History  Procedure Laterality Date  . Cystgastrostomy  09-02-12    per Dr. Cherly Hensen at [facility]   . Cholecystectomy      Family History  Problem Relation Age of Onset  . Cancer      other    History  Substance Use Topics  . Smoking status: Never Smoker   . Smokeless tobacco: Never Used  . Alcohol Use: No    OB History   Grav Para Term Preterm Abortions TAB SAB Ect Mult Living                  Review of Systems  Constitutional: Negative for activity change and appetite change.  HENT: Negative for neck stiffness.   Eyes: Negative for pain.  Respiratory: Negative for chest tightness and shortness of breath.   Cardiovascular: Negative for chest pain and leg swelling.  Gastrointestinal: Positive  for abdominal pain. Negative for nausea, vomiting and diarrhea.  Genitourinary: Negative for flank pain.  Musculoskeletal: Negative for back pain.  Skin: Positive for wound. Negative for rash.  Neurological: Negative for weakness, numbness and headaches.  Psychiatric/Behavioral: Negative for behavioral problems.    Allergies  Aspirin; Latex; Penicillins; Povidone; and Sulfamethoxazole  Home Medications   Current Outpatient Rx  Name  Route  Sig  Dispense  Refill  . albuterol (PROVENTIL HFA;VENTOLIN HFA) 108 (90 BASE) MCG/ACT inhaler   Inhalation   Inhale 2 puffs into the lungs every 4 (four) hours as needed for wheezing or shortness of breath.   1 Inhaler   0   . budesonide-formoterol (SYMBICORT) 160-4.5 MCG/ACT inhaler   Inhalation   Inhale 2 puffs into the lungs 2 (two) times daily.         . cetirizine (ZYRTEC) 10 MG tablet   Oral   Take 1 tablet (10 mg total) by mouth daily.   90 tablet   3   . desvenlafaxine (PRISTIQ) 50 MG 24 hr tablet   Oral   Take 1 tablet (50 mg total) by mouth daily.   90 tablet   1   . esomeprazole (NEXIUM) 40 MG capsule   Oral   Take 1 capsule (40 mg total) by mouth daily.  30 capsule   3   . imipramine (TOFRANIL) 50 MG tablet   Oral   Take 1 tablet (50 mg total) by mouth at bedtime.   30 tablet   11   . insulin aspart protamine-insulin aspart (NOVOLOG 70/30) (70-30) 100 UNIT/ML injection      20 Units daily.          . mometasone (NASONEX) 50 MCG/ACT nasal spray   Nasal   Place 2 sprays into the nose daily.   51 g   3     Each nostril   . montelukast (SINGULAIR) 10 MG tablet   Oral   Take 1 tablet (10 mg total) by mouth at bedtime.   90 tablet   3   . oxybutynin (DITROPAN-XL) 10 MG 24 hr tablet   Oral   Take 1 tablet (10 mg total) by mouth daily.   90 tablet   1   . rOPINIRole (REQUIP) 1 MG tablet   Oral   Take 1 mg by mouth at bedtime as needed (restless leg syndrome.).         Marland Kitchen zolpidem (AMBIEN) 10 MG  tablet   Oral   Take 10 mg by mouth at bedtime as needed for sleep.         . clindamycin (CLEOCIN) 150 MG capsule   Oral   Take 2 capsules (300 mg total) by mouth 3 (three) times daily.   30 capsule   0   . oxyCODONE-acetaminophen (PERCOCET/ROXICET) 5-325 MG per tablet   Oral   Take 1-2 tablets by mouth every 6 (six) hours as needed for pain.   10 tablet   0     BP 114/67  Pulse 71  Temp(Src) 99.2 F (37.3 C) (Oral)  Resp 16  SpO2 97%  Physical Exam  Nursing note and vitals reviewed. Constitutional: She is oriented to person, place, and time. She appears well-developed and well-nourished.  HENT:  Head: Normocephalic and atraumatic.  Eyes: EOM are normal. Pupils are equal, round, and reactive to light.  Neck: Normal range of motion. Neck supple.  Cardiovascular: Normal rate, regular rhythm and normal heart sounds.   No murmur heard. Pulmonary/Chest: Effort normal and breath sounds normal. No respiratory distress. She has no wheezes. She has no rales.  Abdominal: Soft. Bowel sounds are normal. She exhibits no distension. There is tenderness. There is no rebound and no guarding.  Mild diffuse abdominal tenderness. Midline surgical scar with approximately 5 mm dehiscence in the middle of it. Mild clear drainage. The surrounding erythema approximately 2 cm on each side of the wound. There is a firmness below it. There is increased tenderness in this area.  Musculoskeletal: Normal range of motion.  Neurological: She is alert and oriented to person, place, and time. No cranial nerve deficit.  Skin: Skin is warm and dry.  Psychiatric: She has a normal mood and affect. Her speech is normal.    ED Course  Procedures (including critical care time)  Labs Reviewed  CBC WITH DIFFERENTIAL - Abnormal; Notable for the following:    MCV 77.8 (*)    All other components within normal limits  POCT I-STAT, CHEM 8 - Abnormal; Notable for the following:    Creatinine, Ser 0.30 (*)     Glucose, Bld 340 (*)    All other components within normal limits   Ct Abdomen Pelvis W Contrast  02/27/2013  *RADIOLOGY REPORT*  Clinical Data: Abdominal mass, wound  dehiscence  CT ABDOMEN AND PELVIS  WITH CONTRAST  Technique:  Multidetector CT imaging of the abdomen and pelvis was performed following the standard protocol during bolus administration of intravenous contrast.  Contrast: OMNIPAQUE IOHEXOL 300 MG/ML  SOLN, 50mL OMNIPAQUE IOHEXOL 300 MG/ML  SOLN  Comparison: 08/24/2012  Findings: Lung bases are unremarkable.  Sagittal images of the spine shows mild degenerative changes thoracolumbar spine. Postsurgical changes post cholecystectomy are noted in the right upper abdomen.  There is a midline lower abdominal wall  scar.  Liver is unremarkable.  The pancreas is not identified.  The spleen and adrenal glands are unremarkable.  Kidneys are symmetrical in size and enhancement.  No small bowel obstruction.  No ascites or free air.  No adenopathy.  Delayed renal images shows bilateral renal symmetrical excretion. Bilateral visualized proximal ureter is unremarkable.  No hydronephrosis or hydroureter.  In axial image there is mild thickening of the midline abdominal fascia.  There is a small amount of fluid above the fascia in anterior abdominal wall.  The fluid is extending cranial caudally anterior to the fascia midline about 3 cm.  There is mild stranding of the surrounding fat.  Findings are highly suspicious for postsurgical edema or infection/inflammation.  In axial image 35 there is a fluid collection along the abdominal scar.  This fluid collection demonstrates some peripheral enhancement and small amount of air.  This is best visualized in sagittal image 56 measures 5.6 cm cranial caudally by 2.8 cm AP diameter.  This is highly suspicious for infected fluid/abscess along the abdominal scar.  Clinical correlation is necessary.  Mild adjacent skin thickening.  In axial image 56 there is small  amount of fluid in the umbilical region measures 2 x 1.2 cm.  The uterus and adnexa are unremarkable.  The urinary bladder is unremarkable.  There is no pericecal inflammation.  IMPRESSION:  1.  No acute intra-abdominal process.  No small bowel obstruction. 2.  No hydronephrosis or hydroureter. 3.  In axial image there is mild thickening of the midline abdominal fascia.  There is a small amount of fluid above the fascia in anterior abdominal wall.  The fluid is extending cranial caudally anterior to the fascia midline about 3 cm.  There is mild stranding of the surrounding fat.  Findings are highly suspicious for postsurgical edema or infection/inflammation.  In axial image 35 there is a fluid collection along the abdominal scar.  This fluid collection demonstrates some peripheral enhancement and small amount of air.  This is best visualized in sagittal image 56 measures 5.6 cm cranial caudally by 2.8 cm AP diameter.  This is highly suspicious for infected fluid/abscess along the abdominal scar.  Clinical correlation is necessary.   Original Report Authenticated By: Natasha Mead, M.D.      1. Abscess       MDM  Patient presents with small dehisced his wound and fluid collection with erythema. CT was done and showed likely infected fluid or abscess along scar. It has been draining spontaneously. Her white count is reassuring. She does have a hyper glycemia, however she is on insulin at home and will be able manage this. She is well-appearing. She was started on antibiotics and was given an IV dose here. She'll followup with Dr. [name] either tomorrow or the next day and will going to see her surgeon as soon as possible. For any worsening of the infection she'll return to the ER.        Juliet Rude. Rubin Payor, MD 02/27/13 2153

## 2013-02-27 NOTE — Telephone Encounter (Signed)
We can see her tomorrow

## 2013-02-27 NOTE — Telephone Encounter (Signed)
pls advise

## 2013-02-27 NOTE — ED Notes (Signed)
Pt reports bending over to pick something up off floor this am and opening up her abd surgery site. Surgery 4-5 months ago. Site continuing to bleed slowly since being seen at PCP. Sent here for further eval.

## 2013-02-27 NOTE — Telephone Encounter (Signed)
Patient Information:  Caller Name: Rosann Auerbach  Phone: [phone]  Patient: Isabel [name], Isabel [name]  Gender: Female  DOB: 28-Nov-1956  Age: 57 Years  PCP: Gershon Crane [facility])  Pregnant: No  Office Follow Up:  Does the office need to follow up with this patient?: Yes  Instructions For The Office: needs to be seen today  I saw no appts available  RN Note:  she was bending over and felt a pain in her surgical site from last year and since has been oozing clear blood tinged fluid  site is sore, no fever   Seen by [facility] and told to be seen today  Symptoms  Reason For Call & Symptoms: oozing from surgical site  Reviewed Health History In EMR: Yes  Reviewed Medications In EMR: Yes  Reviewed Allergies In EMR: Yes  Reviewed Surgeries / Procedures: Yes  Date of Onset of Symptoms: 02/27/2013 OB / GYN:  LMP: Unknown  Guideline(s) Used:  No Protocol Available - Sick Adult  Disposition Per Guideline:   See Today in Office  Reason For Disposition Reached:   Patient wants to be seen  Advice Given:  N/A  RN Overrode Recommendation:  Document Patient  have to see about work in no appts available

## 2013-02-27 NOTE — Progress Notes (Signed)
  Subjective:    Patient ID: Isabel [name], female    DOB: March 17, 1956, 57 y.o.   MRN: [id number]  HPI Here to check her surgical wound which started bleeding this morning after she bent over to pick up something at home. She felt a sudden sharp pain, and her would opened up a bit. She has had a moderate amount of bleeding ever since then, and she has some mild generalized abdominal pain. No fever or nausea. On 09-02-12 she had bariatric surgery at [facility].    Review of Systems  Constitutional: Negative.   Gastrointestinal: Positive for abdominal pain. Negative for nausea, vomiting, diarrhea, constipation, blood in stool and abdominal distention.       Objective:   Physical Exam  Constitutional: She appears well-developed and well-nourished. No distress.  Abdominal: Soft. Bowel sounds are normal. She exhibits no distension and no mass. There is no rebound and no guarding.  Her vertical midline scar has opened up at the inferior end with some serosanguinous fluid draining from it. With gentle pressure I express some moderate amounts of bloody fluid from it. She has moderate tenderness throughout the entire abdomen.           Assessment & Plan:  She has dehisced her surgical wound but it is difficult to tell from the exam how deep this actually goes. She will go immediately to [facility] ER for evaluation.

## 2013-02-27 NOTE — Telephone Encounter (Signed)
Can you call pt and schedule for tomorrow?

## 2013-02-27 NOTE — Telephone Encounter (Signed)
appt scheduled

## 2013-02-27 NOTE — ED Notes (Signed)
Pt family member reports that contrast is causing pt to have a rapid heart rate and nausea. Dr Haynes Dage made aware and pt instructed not to drink contrast.

## 2013-02-28 ENCOUNTER — Telehealth: Payer: Self-pay | Admitting: Family Medicine

## 2013-02-28 NOTE — Telephone Encounter (Signed)
I spoke with pt and she is going to schedule a visit for next week, per Dr. [name] this is okay.

## 2013-02-28 NOTE — Telephone Encounter (Signed)
FYI

## 2013-02-28 NOTE — Telephone Encounter (Signed)
Patient Information:  Caller Name: Rosann Auerbach  Phone: [phone]  Patient: Isabel [name], Isabel [name]  Gender: Female  DOB: Nov 19, 1956  Age: 57 Years  PCP: Gershon Crane [facility])  Pregnant: No  Office Follow Up:  Does the office need to follow up with this patient?: No  Instructions For The Office: N/A   Symptoms  Reason For Call & Symptoms: pt was in the office on  02/27/13 and patient was sent to ER. ER MD states pt has an abscess and put pt on antibiotics stating pt needs to be followed up by Dr [name] on 03/01/13.  Caller is wanting to know if patient needs to be followed up then, would the anitbiotics have time to work?  RN advised caller that since ER doctor evaluated patient, she should follow the directions.  Pt needs close follow up to ensure she is improving and surgery is not needed.  Caller states that she will have to call the office back once she finds a ride for the visit tomorrow  Reviewed Health History In EMR: No  Reviewed Medications In EMR: No  Reviewed Allergies In EMR: No  Reviewed Surgeries / Procedures: No  Date of Onset of Symptoms: Unknown OB / GYN:  LMP: Unknown  Guideline(s) Used:  No Protocol Available - Information Only  Disposition Per Guideline:   Home Care  Reason For Disposition Reached:   Information only question and nurse able to answer  Advice Given:  N/A  Patient Will Follow Care Advice:  YES

## 2013-03-08 ENCOUNTER — Ambulatory Visit (INDEPENDENT_AMBULATORY_CARE_PROVIDER_SITE_OTHER): Payer: Medicare Other | Admitting: Family Medicine

## 2013-03-08 ENCOUNTER — Encounter: Payer: Self-pay | Admitting: Family Medicine

## 2013-03-08 VITALS — BP 116/76 | HR 94 | Temp 99.5°F | Wt 144.0 lb

## 2013-03-08 DIAGNOSIS — L02219 Cutaneous abscess of trunk, unspecified: Secondary | ICD-10-CM

## 2013-03-08 DIAGNOSIS — L02211 Cutaneous abscess of abdominal wall: Secondary | ICD-10-CM

## 2013-03-08 DIAGNOSIS — L03319 Cellulitis of trunk, unspecified: Secondary | ICD-10-CM | POA: Diagnosis not present

## 2013-03-08 MED ORDER — CLINDAMYCIN HCL 300 MG PO CAPS: 300.0000 mg | ORAL_CAPSULE | Freq: Three times a day (TID) | ORAL | Status: DC

## 2013-03-08 NOTE — Progress Notes (Signed)
  Subjective:    Patient ID: Isabel [name], female    DOB: 03/28/1956, 57 y.o.   MRN: [id number]  HPI Here to follow up on a dehisced surgical wound on the abdomen. We saw her on 02-27-13 for this and she was sent to the ER. A CT scan that day revealed a superficial abscess under the wound, and she was put on a 10 day course of Clindamycin. The area has improved a lot since then, and it has closed with no more drainage. It is not tender.    Review of Systems  Constitutional: Negative.   Gastrointestinal: Negative.        Objective:   Physical Exam  Constitutional: She appears well-developed and well-nourished.  Abdominal: Soft. Bowel sounds are normal. She exhibits no distension and no mass. There is no tenderness. There is no rebound and no guarding.  The wound has closed with a small eschar present. The area beneath this is still indurated but is much smaller than before. Not tender           Assessment & Plan:  Partially treated abscess. She is given another 10 days of antibiotics and this should take care of it. Recheck prn

## 2013-03-19 ENCOUNTER — Other Ambulatory Visit: Payer: Self-pay | Admitting: Family Medicine

## 2013-04-04 DIAGNOSIS — G4710 Hypersomnia, unspecified: Secondary | ICD-10-CM | POA: Diagnosis not present

## 2013-04-04 DIAGNOSIS — E559 Vitamin D deficiency, unspecified: Secondary | ICD-10-CM | POA: Diagnosis not present

## 2013-04-04 DIAGNOSIS — K219 Gastro-esophageal reflux disease without esophagitis: Secondary | ICD-10-CM | POA: Diagnosis not present

## 2013-04-04 DIAGNOSIS — R0989 Other specified symptoms and signs involving the circulatory and respiratory systems: Secondary | ICD-10-CM | POA: Diagnosis not present

## 2013-04-04 DIAGNOSIS — M79609 Pain in unspecified limb: Secondary | ICD-10-CM | POA: Diagnosis not present

## 2013-04-04 DIAGNOSIS — J3089 Other allergic rhinitis: Secondary | ICD-10-CM | POA: Diagnosis not present

## 2013-04-04 DIAGNOSIS — R0609 Other forms of dyspnea: Secondary | ICD-10-CM | POA: Diagnosis not present

## 2013-04-04 DIAGNOSIS — R5383 Other fatigue: Secondary | ICD-10-CM | POA: Diagnosis not present

## 2013-04-04 DIAGNOSIS — R5381 Other malaise: Secondary | ICD-10-CM | POA: Diagnosis not present

## 2013-04-04 DIAGNOSIS — G4730 Sleep apnea, unspecified: Secondary | ICD-10-CM | POA: Diagnosis not present

## 2013-04-19 ENCOUNTER — Other Ambulatory Visit: Payer: Self-pay | Admitting: Family Medicine

## 2013-04-20 NOTE — Telephone Encounter (Signed)
Okay for one year on all meds

## 2013-05-09 ENCOUNTER — Telehealth: Payer: Self-pay | Admitting: Family Medicine

## 2013-05-09 NOTE — Telephone Encounter (Signed)
Refill request for test strips [employer] and only order a 6 month supply at a time [employer] will not cover any more than that )

## 2013-05-11 MED ORDER — GLUCOSE BLOOD VI STRP: ORAL_STRIP | Status: DC

## 2013-05-11 NOTE — Telephone Encounter (Signed)
I sent script e-scribe. 

## 2013-05-17 ENCOUNTER — Other Ambulatory Visit: Payer: Self-pay | Admitting: Family Medicine

## 2013-05-18 NOTE — Telephone Encounter (Signed)
Okay for 6 months 

## 2013-06-07 ENCOUNTER — Other Ambulatory Visit: Payer: Self-pay | Admitting: Family Medicine

## 2013-06-09 ENCOUNTER — Emergency Department (HOSPITAL_COMMUNITY): Payer: Medicare Other

## 2013-06-09 ENCOUNTER — Encounter (HOSPITAL_COMMUNITY): Payer: Self-pay

## 2013-06-09 ENCOUNTER — Inpatient Hospital Stay (HOSPITAL_COMMUNITY)
Admission: EM | Admit: 2013-06-09 | Discharge: 2013-06-13 | DRG: 638 | Disposition: A | Discharge status: Home / Self Care | Payer: Medicare Other | Attending: Internal Medicine | Admitting: Internal Medicine

## 2013-06-09 DIAGNOSIS — R5381 Other malaise: Secondary | ICD-10-CM | POA: Diagnosis present

## 2013-06-09 DIAGNOSIS — F3289 Other specified depressive episodes: Secondary | ICD-10-CM

## 2013-06-09 DIAGNOSIS — J309 Allergic rhinitis, unspecified: Secondary | ICD-10-CM

## 2013-06-09 DIAGNOSIS — E669 Obesity, unspecified: Secondary | ICD-10-CM

## 2013-06-09 DIAGNOSIS — I10 Essential (primary) hypertension: Secondary | ICD-10-CM

## 2013-06-09 DIAGNOSIS — F411 Generalized anxiety disorder: Secondary | ICD-10-CM | POA: Diagnosis present

## 2013-06-09 DIAGNOSIS — D72829 Elevated white blood cell count, unspecified: Secondary | ICD-10-CM

## 2013-06-09 DIAGNOSIS — D696 Thrombocytopenia, unspecified: Secondary | ICD-10-CM | POA: Diagnosis present

## 2013-06-09 DIAGNOSIS — Z9119 Patient's noncompliance with other medical treatment and regimen: Secondary | ICD-10-CM | POA: Diagnosis not present

## 2013-06-09 DIAGNOSIS — R110 Nausea: Secondary | ICD-10-CM | POA: Diagnosis present

## 2013-06-09 DIAGNOSIS — E1110 Type 2 diabetes mellitus with ketoacidosis without coma: Secondary | ICD-10-CM | POA: Diagnosis not present

## 2013-06-09 DIAGNOSIS — Z794 Long term (current) use of insulin: Secondary | ICD-10-CM

## 2013-06-09 DIAGNOSIS — J45909 Unspecified asthma, uncomplicated: Secondary | ICD-10-CM

## 2013-06-09 DIAGNOSIS — E118 Type 2 diabetes mellitus with unspecified complications: Secondary | ICD-10-CM | POA: Diagnosis not present

## 2013-06-09 DIAGNOSIS — E119 Type 2 diabetes mellitus without complications: Secondary | ICD-10-CM | POA: Diagnosis not present

## 2013-06-09 DIAGNOSIS — E876 Hypokalemia: Secondary | ICD-10-CM

## 2013-06-09 DIAGNOSIS — R404 Transient alteration of awareness: Secondary | ICD-10-CM | POA: Diagnosis not present

## 2013-06-09 DIAGNOSIS — F329 Major depressive disorder, single episode, unspecified: Secondary | ICD-10-CM

## 2013-06-09 DIAGNOSIS — E1310 Other specified diabetes mellitus with ketoacidosis without coma: Secondary | ICD-10-CM | POA: Diagnosis present

## 2013-06-09 DIAGNOSIS — G4730 Sleep apnea, unspecified: Secondary | ICD-10-CM

## 2013-06-09 DIAGNOSIS — R141 Gas pain: Secondary | ICD-10-CM | POA: Diagnosis not present

## 2013-06-09 DIAGNOSIS — Z91199 Patient's noncompliance with other medical treatment and regimen due to unspecified reason: Secondary | ICD-10-CM

## 2013-06-09 DIAGNOSIS — E871 Hypo-osmolality and hyponatremia: Secondary | ICD-10-CM | POA: Diagnosis not present

## 2013-06-09 DIAGNOSIS — K219 Gastro-esophageal reflux disease without esophagitis: Secondary | ICD-10-CM

## 2013-06-09 DIAGNOSIS — R5383 Other fatigue: Secondary | ICD-10-CM | POA: Diagnosis present

## 2013-06-09 DIAGNOSIS — R7309 Other abnormal glucose: Secondary | ICD-10-CM | POA: Diagnosis not present

## 2013-06-09 DIAGNOSIS — R1084 Generalized abdominal pain: Secondary | ICD-10-CM | POA: Diagnosis not present

## 2013-06-09 DIAGNOSIS — R143 Flatulence: Secondary | ICD-10-CM | POA: Diagnosis not present

## 2013-06-09 DIAGNOSIS — F32A Depression, unspecified: Secondary | ICD-10-CM | POA: Diagnosis present

## 2013-06-09 LAB — POCT I-STAT, CHEM 8
Glucose, Bld: 598 mg/dL (ref 70–99)
HCT: 51 % — ABNORMAL HIGH (ref 36.0–46.0)
Hemoglobin: 17.3 g/dL — ABNORMAL HIGH (ref 12.0–15.0)
Potassium: 4.7 mEq/L (ref 3.5–5.1)
Sodium: 129 mEq/L — ABNORMAL LOW (ref 135–145)

## 2013-06-09 LAB — CBC WITH DIFFERENTIAL/PLATELET
Basophils Relative: 0 % (ref 0–1)
Eosinophils Absolute: 0 10*3/uL (ref 0.0–0.7)
Eosinophils Relative: 0 % (ref 0–5)
HCT: 44.6 % (ref 36.0–46.0)
Hemoglobin: 15.4 g/dL — ABNORMAL HIGH (ref 12.0–15.0)
MCH: 27.8 pg (ref 26.0–34.0)
MCHC: 34.5 g/dL (ref 30.0–36.0)
Monocytes Absolute: 1.2 10*3/uL — ABNORMAL HIGH (ref 0.1–1.0)
Neutro Abs: 21.6 10*3/uL — ABNORMAL HIGH (ref 1.7–7.7)
RBC: 5.54 MIL/uL — ABNORMAL HIGH (ref 3.87–5.11)

## 2013-06-09 LAB — URINALYSIS, ROUTINE W REFLEX MICROSCOPIC
Bilirubin Urine: NEGATIVE
Ketones, ur: >80 mg/dL — AB
Nitrite: NEGATIVE
Protein, ur: 30 mg/dL — AB
Specific Gravity, Urine: 1.032 — ABNORMAL HIGH (ref 1.005–1.030)
Urobilinogen, UA: 0.2 mg/dL (ref 0.0–1.0)

## 2013-06-09 LAB — GLUCOSE, CAPILLARY
Glucose-Capillary: 499 mg/dL — ABNORMAL HIGH (ref 70–99)
Glucose-Capillary: 416 mg/dL — ABNORMAL HIGH (ref 70–99)
Glucose-Capillary: 374 mg/dL — ABNORMAL HIGH (ref 70–99)
Glucose-Capillary: 277 mg/dL — ABNORMAL HIGH (ref 70–99)
Glucose-Capillary: 245 mg/dL — ABNORMAL HIGH (ref 70–99)

## 2013-06-09 LAB — COMPREHENSIVE METABOLIC PANEL
ALT: 17 U/L (ref 0–35)
Alkaline Phosphatase: 172 U/L — ABNORMAL HIGH (ref 39–117)
BUN: 19 mg/dL (ref 6–23)
CO2: <7 mEq/L — CL (ref 19–32)
GFR calc Af Amer: >90 mL/min (ref >90)
GFR calc non Af Amer: >90 mL/min (ref >90)
Glucose, Bld: 592 mg/dL (ref 70–99)
Potassium: 4.6 mEq/L (ref 3.5–5.1)
Total Protein: 8.5 g/dL — ABNORMAL HIGH (ref 6.0–8.3)

## 2013-06-09 LAB — URINE MICROSCOPIC-ADD ON

## 2013-06-09 LAB — BLOOD GAS, VENOUS
Acid-base deficit: 28.5 mmol/L — ABNORMAL HIGH (ref 0.0–2.0)
Bicarbonate: 4.3 mEq/L — ABNORMAL LOW (ref 20.0–24.0)
Drawn by: 257701
O2 Saturation: 76 %
Patient temperature: 98.6
TCO2: 4.4 mmol/L (ref 0–100)
pH, Ven: 6.962 — CL (ref 7.250–7.300)

## 2013-06-09 LAB — LIPASE, BLOOD: Lipase: 25 U/L (ref 11–59)

## 2013-06-09 MED ORDER — PANTOPRAZOLE SODIUM 40 MG PO TBEC
40.0000 mg | DELAYED_RELEASE_TABLET | Freq: Two times a day (BID) | ORAL | Status: DC
  Administered 2013-06-09 – 2013-06-13 (×8): 40 mg via ORAL
  Filled 2013-06-09 (×8): qty 1

## 2013-06-09 MED ORDER — ACETAMINOPHEN 325 MG PO TABS: 650.0000 mg | ORAL_TABLET | Freq: Four times a day (QID) | ORAL | Status: DC | PRN

## 2013-06-09 MED ORDER — ACETAMINOPHEN 650 MG RE SUPP: 650.0000 mg | Freq: Four times a day (QID) | RECTAL | Status: DC | PRN

## 2013-06-09 MED ORDER — OXYBUTYNIN CHLORIDE ER 10 MG PO TB24
10.0000 mg | ORAL_TABLET | Freq: Every day | ORAL | Status: DC
  Administered 2013-06-09 – 2013-06-13 (×5): 10 mg via ORAL
  Filled 2013-06-09 (×5): qty 1

## 2013-06-09 MED ORDER — INSULIN REGULAR BOLUS VIA INFUSION
0.0000 [IU] | Freq: Three times a day (TID) | INTRAVENOUS | Status: DC
  Filled 2013-06-09: qty 10

## 2013-06-09 MED ORDER — SODIUM CHLORIDE 0.9 % IV SOLN: INTRAVENOUS | Status: DC

## 2013-06-09 MED ORDER — BUDESONIDE-FORMOTEROL FUMARATE 160-4.5 MCG/ACT IN AERO
2.0000 | INHALATION_SPRAY | Freq: Two times a day (BID) | RESPIRATORY_TRACT | Status: DC
  Administered 2013-06-09 – 2013-06-13 (×8): 2 via RESPIRATORY_TRACT
  Filled 2013-06-09 (×2): qty 6

## 2013-06-09 MED ORDER — ROPINIROLE HCL 1 MG PO TABS
1.0000 mg | ORAL_TABLET | Freq: Every day | ORAL | Status: DC
  Administered 2013-06-09 – 2013-06-12 (×4): 1 mg via ORAL
  Filled 2013-06-09 (×5): qty 1

## 2013-06-09 MED ORDER — SODIUM CHLORIDE 0.9 % IV BOLUS (SEPSIS)
2000.0000 mL | Freq: Once | INTRAVENOUS | Status: AC
  Administered 2013-06-09: 2000 mL via INTRAVENOUS

## 2013-06-09 MED ORDER — HEPARIN SODIUM (PORCINE) 5000 UNIT/ML IJ SOLN: 5000.0000 [IU] | Freq: Three times a day (TID) | INTRAMUSCULAR | Status: DC

## 2013-06-09 MED ORDER — MORPHINE SULFATE 2 MG/ML IJ SOLN: 1.0000 mg | INTRAMUSCULAR | Status: DC | PRN

## 2013-06-09 MED ORDER — MONTELUKAST SODIUM 10 MG PO TABS
10.0000 mg | ORAL_TABLET | Freq: Every day | ORAL | Status: DC
  Administered 2013-06-09 – 2013-06-12 (×4): 10 mg via ORAL
  Filled 2013-06-09 (×5): qty 1

## 2013-06-09 MED ORDER — ONDANSETRON HCL 4 MG/2ML IJ SOLN: 4.0000 mg | Freq: Four times a day (QID) | INTRAMUSCULAR | Status: DC | PRN

## 2013-06-09 MED ORDER — ZOLPIDEM TARTRATE 5 MG PO TABS
5.0000 mg | ORAL_TABLET | Freq: Every evening | ORAL | Status: DC | PRN
  Administered 2013-06-10: 5 mg via ORAL
  Filled 2013-06-09: qty 1

## 2013-06-09 MED ORDER — VENLAFAXINE HCL ER 75 MG PO CP24
75.0000 mg | ORAL_CAPSULE | Freq: Every day | ORAL | Status: DC
  Administered 2013-06-10 – 2013-06-13 (×4): 75 mg via ORAL
  Filled 2013-06-09 (×5): qty 1

## 2013-06-09 MED ORDER — HEPARIN SODIUM (PORCINE) 5000 UNIT/ML IJ SOLN
5000.0000 [IU] | Freq: Three times a day (TID) | INTRAMUSCULAR | Status: DC
  Administered 2013-06-09 – 2013-06-12 (×9): 5000 [IU] via SUBCUTANEOUS
  Filled 2013-06-09 (×11): qty 1

## 2013-06-09 MED ORDER — DEXTROSE 50 % IV SOLN: 25.0000 mL | INTRAVENOUS | Status: DC | PRN

## 2013-06-09 MED ORDER — SODIUM CHLORIDE 0.9 % IV BOLUS (SEPSIS)
1000.0000 mL | Freq: Once | INTRAVENOUS | Status: AC
  Administered 2013-06-09: 1000 mL via INTRAVENOUS

## 2013-06-09 MED ORDER — CHLORHEXIDINE GLUCONATE CLOTH 2 % EX PADS
6.0000 | MEDICATED_PAD | Freq: Every day | CUTANEOUS | Status: DC
  Administered 2013-06-10 – 2013-06-13 (×3): 6 via TOPICAL

## 2013-06-09 MED ORDER — POTASSIUM CHLORIDE 10 MEQ/100ML IV SOLN
10.0000 meq | INTRAVENOUS | Status: AC
  Administered 2013-06-09 (×2): 10 meq via INTRAVENOUS
  Filled 2013-06-09: qty 200

## 2013-06-09 MED ORDER — LORATADINE 10 MG PO TABS
10.0000 mg | ORAL_TABLET | Freq: Every day | ORAL | Status: DC
  Administered 2013-06-09 – 2013-06-13 (×5): 10 mg via ORAL
  Filled 2013-06-09 (×5): qty 1

## 2013-06-09 MED ORDER — DEXTROSE-NACL 5-0.45 % IV SOLN
INTRAVENOUS | Status: DC
  Administered 2013-06-10: 16:00:00 via INTRAVENOUS
  Administered 2013-06-10: 125 mL via INTRAVENOUS

## 2013-06-09 MED ORDER — FLUTICASONE PROPIONATE 50 MCG/ACT NA SUSP
2.0000 | Freq: Every day | NASAL | Status: DC
  Administered 2013-06-09 – 2013-06-13 (×5): 2 via NASAL
  Filled 2013-06-09: qty 16

## 2013-06-09 MED ORDER — SODIUM CHLORIDE 0.9 % IV SOLN
INTRAVENOUS | Status: DC
  Administered 2013-06-09: 4.4 [IU]/h via INTRAVENOUS
  Filled 2013-06-09 (×2): qty 1

## 2013-06-09 MED ORDER — ONDANSETRON HCL 4 MG PO TABS: 4.0000 mg | ORAL_TABLET | Freq: Four times a day (QID) | ORAL | Status: DC | PRN

## 2013-06-09 MED ORDER — ALBUTEROL SULFATE (5 MG/ML) 0.5% IN NEBU: 2.5000 mg | INHALATION_SOLUTION | RESPIRATORY_TRACT | Status: DC | PRN

## 2013-06-09 MED ORDER — DEXTROSE-NACL 5-0.45 % IV SOLN
INTRAVENOUS | Status: DC
  Administered 2013-06-11: 100 mL via INTRAVENOUS

## 2013-06-09 MED ORDER — SODIUM CHLORIDE 0.9 % IV SOLN
INTRAVENOUS | Status: DC
  Administered 2013-06-10: 125 mL/h via INTRAVENOUS
  Administered 2013-06-11: 75 mL via INTRAVENOUS
  Administered 2013-06-11: 23:00:00 via INTRAVENOUS

## 2013-06-09 MED ORDER — MUPIROCIN 2 % EX OINT
1.0000 "application " | TOPICAL_OINTMENT | Freq: Two times a day (BID) | CUTANEOUS | Status: DC
  Administered 2013-06-09 – 2013-06-13 (×8): 1 via NASAL
  Filled 2013-06-09: qty 22

## 2013-06-09 MED ORDER — IMIPRAMINE HCL 50 MG PO TABS
50.0000 mg | ORAL_TABLET | Freq: Every day | ORAL | Status: DC
  Administered 2013-06-09 – 2013-06-12 (×4): 50 mg via ORAL
  Filled 2013-06-09 (×5): qty 1

## 2013-06-09 NOTE — ED Notes (Signed)
EMS was called for shortness of breath, Weakness, fatigue, dizziness, thirst, nausea, vomiting x1, and constipation. Onset last night.  Pt is diabetic, didn't take her insulin last night and this morning (because she was told not to take insulin if she feels sick) CBG was HI in ED. Lungs are clear, sat 100% on RA, Sinus Tachy at 102. IV established.

## 2013-06-09 NOTE — Progress Notes (Signed)
Isabel [name] 1233 WAS ADMITTED FROM THE ED. HER COUSIN [name]  WHOM SHE LIVES WITH SAYS SHE IS SUICIDAL. THE PATIENT SAID SHE WAS NOT AT PRESENT. THE MD DR [facility] [place] WAS CALLED AND INFORMED WHAT THE PATIENT'S COUSIN SAID. HE DID NOT WANT TO ORDER PRECAUTIONS NOW AND SAID HE WOULD FOLLOW-UP. THE CN WAS TOLD AND TALKED WITH THE PATIENT'S COUSIN. THE PATIENT DENIED BEING SUICIDAL AT PRESENT.

## 2013-06-09 NOTE — ED Notes (Signed)
Called report, was advised patient is being repinned to room 1233. 1233 is being cleaned at this time. Please allow 15 minutes before transporting patient.

## 2013-06-09 NOTE — ED Notes (Signed)
WUJ:[id number]<JY> Expected date:06/09/13<BR> Expected time:12:51 PM<BR> Means of arrival:Ambulance<BR> Comments:<BR> Shob, hyperglycemia, weakness

## 2013-06-09 NOTE — ED Provider Notes (Signed)
History    CSN: [id number] Arrival date & time 06/09/13  1303  First MD Initiated Contact with Patient 06/09/13 1314     Chief Complaint  Patient presents with  . Weakness  . Hyperglycemia   (Consider location/radiation/quality/duration/timing/severity/associated sxs/prior Treatment) HPI This 57 year old female has a history of diabetes, last night she had one episode of brief nausea and vomiting nonbloody so did not take her insulin last night, today she has generalized weakness all day with a dry mouth elevated blood sugars today too high to read and mild diffuse abdominal pain without nausea or vomiting without diarrhea she did have a small bowel movement today she is no bloody stools no dysuria she has chronic urinary frequency but increased urinary frequency today she is no trauma fever or altered mental status headache stiff neck rash or chest pain cough or shortness of breath no treatment prior to arrival, her blood sugar was normal last night. Past Medical History  Diagnosis Date  . Allergy   . Asthma   . Depression   . GERD (gastroesophageal reflux disease)   . Hypertension   . Cystitis, interstitial   . Pneumonia   . Diabetes mellitus without complication   . Pancreatic pseudocyst   . Sleep apnea     wears a CPAP at night    Past Surgical History  Procedure Laterality Date  . Cystgastrostomy  09-02-12    per Dr. Cherly Hensen at [facility]   . Cholecystectomy     Family History  Problem Relation Age of Onset  . Cancer      other   History  Substance Use Topics  . Smoking status: Never Smoker   . Smokeless tobacco: Never Used  . Alcohol Use: No   OB History   Grav Para Term Preterm Abortions TAB SAB Ect Mult Living                 Review of Systems 10 Systems reviewed and are negative for acute change except as noted in the HPI. Allergies  Aspirin; Latex; Penicillins; Povidone; and Sulfamethoxazole  Home Medications   No current outpatient prescriptions on  file. BP 124/73  Pulse 95  Temp(Src) 97.5 F (36.4 C) (Oral)  Resp 19  Ht 5\' 7"  (1.702 m)  Wt 140 lb 4.8 oz (63.64 kg)  BMI 21.97 kg/m2  SpO2 100% Physical Exam  Nursing note and vitals reviewed. Constitutional:  Awake, alert, nontoxic appearance.  HENT:  Head: Atraumatic.  Dry oral mucosa  Eyes: Right eye exhibits no discharge. Left eye exhibits no discharge.  Neck: Neck supple.  Cardiovascular: Regular rhythm.   No murmur heard. Tachycardic  Pulmonary/Chest: Effort normal and breath sounds normal. No respiratory distress. She has no wheezes. She has no rales. She exhibits no tenderness.  Abdominal: Soft. Bowel sounds are normal. She exhibits no distension and no mass. There is tenderness. There is no rebound and no guarding.  Minimal diffuse tenderness without rebound  Musculoskeletal: She exhibits no edema and no tenderness.  Baseline ROM, no obvious new focal weakness.  Neurological: She is alert.  Mental status and motor strength appears baseline for patient and situation.  Skin: No rash noted.  Psychiatric: She has a normal mood and affect.    ED Course  Procedures (including critical care time) Patient / Family / Caregiver understand and agree with initial ED impression and plan with expectations set for ED visit. Patient / Family / Caregiver informed of clinical course, understand medical decision-making process, and agree  with plan. CRITICAL CARE Performed by: Hurman Horn Total critical care time: including IVF and insulin drip with critically low pH Critical care time was exclusive of separately billable procedures and treating other patients. Critical care was necessary to treat or prevent imminent or life-threatening deterioration. Critical care was time spent personally by me on the following activities: development of treatment plan with patient and/or surrogate as well as nursing, discussions with consultants, evaluation of patient's response to  treatment, examination of patient, obtaining history from patient or surrogate, ordering and performing treatments and interventions, ordering and review of laboratory studies, ordering and review of radiographic studies, pulse oximetry and re-evaluation of patient's condition. Labs Reviewed  MRSA PCR SCREENING - Abnormal; Notable for the following:    MRSA by PCR POSITIVE (*)    All other components within normal limits  GLUCOSE, CAPILLARY - Abnormal; Notable for the following:    Glucose-Capillary >600 (*)    All other components within normal limits  CBC WITH DIFFERENTIAL - Abnormal; Notable for the following:    WBC 24.0 (*)    RBC 5.54 (*)    Hemoglobin 15.4 (*)    Neutrophils Relative % 90 (*)    Lymphocytes Relative 5 (*)    Neutro Abs 21.6 (*)    Monocytes Absolute 1.2 (*)    All other components within normal limits  COMPREHENSIVE METABOLIC PANEL - Abnormal; Notable for the following:    Sodium 123 (*)    Chloride 88 (*)    CO2 <7 (*)    Glucose, Bld 592 (*)    Total Protein 8.5 (*)    Alkaline Phosphatase 172 (*)    All other components within normal limits  URINALYSIS, ROUTINE W REFLEX MICROSCOPIC - Abnormal; Notable for the following:    APPearance CLOUDY (*)    Specific Gravity, Urine 1.032 (*)    Glucose, UA >1000 (*)    Hgb urine dipstick TRACE (*)    Ketones, ur >80 (*)    Protein, ur 30 (*)    All other components within normal limits  KETONES, QUALITATIVE - Abnormal; Notable for the following:    Acetone, Bld LARGE (*)    All other components within normal limits  BLOOD GAS, VENOUS - Abnormal; Notable for the following:    pH, Ven 6.962 (*)    pCO2, Ven 20.2 (*)    Bicarbonate 4.3 (*)    Acid-base deficit 28.5 (*)    All other components within normal limits  URINE MICROSCOPIC-ADD ON - Abnormal; Notable for the following:    Bacteria, UA FEW (*)    All other components within normal limits  GLUCOSE, CAPILLARY - Abnormal; Notable for the following:     Glucose-Capillary 499 (*)    All other components within normal limits  MAGNESIUM - Abnormal; Notable for the following:    Magnesium 1.3 (*)    All other components within normal limits  GLUCOSE, CAPILLARY - Abnormal; Notable for the following:    Glucose-Capillary 416 (*)    All other components within normal limits  GLUCOSE, CAPILLARY - Abnormal; Notable for the following:    Glucose-Capillary 374 (*)    All other components within normal limits  GLUCOSE, CAPILLARY - Abnormal; Notable for the following:    Glucose-Capillary 277 (*)    All other components within normal limits  GLUCOSE, CAPILLARY - Abnormal; Notable for the following:    Glucose-Capillary 245 (*)    All other components within normal limits  POCT I-STAT,  CHEM 8 - Abnormal; Notable for the following:    Sodium 129 (*)    Glucose, Bld 598 (*)    Calcium, Ion 1.25 (*)    Hemoglobin 17.3 (*)    HCT 51.0 (*)    All other components within normal limits  LIPASE, BLOOD  PHOSPHORUS  TROPONIN I  HEMOGLOBIN A1C  TROPONIN I  TROPONIN I  CBC   Dg Abd Acute W/chest  06/09/2013   *RADIOLOGY REPORT*  Clinical Data: Abdominal pain.  Nausea.  Diabetes.  ACUTE ABDOMEN SERIES (ABDOMEN 2 VIEW & CHEST 1 VIEW)  Comparison: 02/27/2013  Findings: Asymmetric sclerosis of the left posterolateral sixth rib.  The patient is rotated to the left on today's exam, resulting in reduced diagnostic sensitivity and specificity.  Low lung volumes are present, causing crowding of the pulmonary vasculature. Cardiac and mediastinal contours appear unremarkable.  Lateral decubitus view of the abdomen demonstrates no abnormal air- fluid level although the stomach appears distended.  Formed stool noted in the ascending colon, descending colon, and sigmoid colon. The small bowel is relatively gasless.  IMPRESSION: 1.  Distended stomach with relatively gasless small bowel.  This appearance is nonspecific and may simply represent a normal variation, but the  proximal obstruction is not confidently excluded. 2.  Sclerotic left sixth posterior rib, probably due to a healing rib fracture.  If the patient has a history malignancy with predilection for bony spread, further workup by chest CT or bone scan might be warranted.   Original Report Authenticated By: Gaylyn Rong, M.D.   1. DKA (diabetic ketoacidoses)   2. Depressive disorder, not elsewhere classified   3. Diabetes mellitus type 2 in obese   4. Esophageal reflux   5. Hyponatremia   6. Leukocytosis   7. Sleep apnea   8. Unspecified asthma(493.90)   9. Unspecified essential hypertension     MDM  The patient appears reasonably stabilized for admission considering the current resources, flow, and capabilities available in the ED at this time, and I doubt any other [facility] requiring further screening and/or treatment in the ED prior to admission.  Hurman Horn, MD 06/09/13 2233

## 2013-06-09 NOTE — H&P (Addendum)
[facility] History and Physical  Isabel [name] GNF:[id number] DOB: 02/01/1956 DOA: 06/09/2013  Referring physician: Dr. Fonnie Jarvis PCP: Nelwyn Salisbury, MD   Chief Complaint: generalized weakness, Nausea and elevated blood sugar   HPI: Isabel [name] is a 57 y.o. female with PMH significant for HTN, DM type 2 on insulin; GERD, asthma, sleep apnea and depression; came to ED secondary to generalized weakness, Nausea and elevated blood sugar. Patient reports not feeling well in the last couple of days prior to admission and has not been compliant with insulin as she has not been eating much. On the day of admission she noticed she was breathing faster than usual, feeling really thirsty and with abd discomfort; CBG's at home too high to read; patient decide to come to ED. In ED was found with DKA. [facility] called to admit for further evaluation and treatment Patient denies CP, SOB, cough, fever, chills, dysuria, hematemesis, melena, HA's, stiff neck symptoms or any other complaints.   Review of Systems:  Negative except as otherwise mentioned on HPI.  Past Medical History  Diagnosis Date  . Allergy   . Asthma   . Depression   . GERD (gastroesophageal reflux disease)   . Hypertension   . Cystitis, interstitial   . Pneumonia   . Diabetes mellitus without complication   . Pancreatic pseudocyst   . Sleep apnea     wears a CPAP at night    Past Surgical History  Procedure Laterality Date  . Cystgastrostomy  09-02-12    per Dr. Cherly Hensen at [facility] [place]   . Cholecystectomy     Social History:  reports that she has never smoked. She has never used smokeless tobacco. She reports that she does not drink alcohol or use illicit drugs. Lives at home; cousin help providing care.  Allergies  Allergen Reactions  . Aspirin     REACTION: unspecified  . Latex     rash  . Penicillins     REACTION: unspecified  . Povidone     REACTION: unspecified  . Sulfamethoxazole     REACTION: unspecified     Family History  Problem Relation Age of Onset  . Cancer      other    Prior to Admission medications   Medication Sig Start Date End Date Taking? Authorizing Provider  albuterol (PROVENTIL HFA;VENTOLIN HFA) 108 (90 BASE) MCG/ACT inhaler Inhale 2 puffs into the lungs every 4 (four) hours as needed for wheezing or shortness of breath.   Yes Historical Provider, MD  budesonide-formoterol (SYMBICORT) 160-4.5 MCG/ACT inhaler Inhale 2 puffs into the lungs 2 (two) times daily.   Yes Historical Provider, MD  cetirizine (ZYRTEC) 10 MG tablet Take 1 tablet (10 mg total) by mouth daily. 12/21/12  Yes Nelwyn Salisbury, MD  desvenlafaxine (PRISTIQ) 50 MG 24 hr tablet Take 50 mg by mouth daily.   Yes Historical Provider, MD  esomeprazole (NEXIUM) 40 MG capsule Take 1 capsule (40 mg total) by mouth daily. 10/09/12  Yes Nelwyn Salisbury, MD  imipramine (TOFRANIL) 50 MG tablet Take 1 tablet (50 mg total) by mouth at bedtime. 10/09/12  Yes Nelwyn Salisbury, MD  insulin aspart protamine-insulin aspart (NOVOLOG 70/30) (70-30) 100 UNIT/ML injection Inject 20 Units into the skin daily.  10/10/12  Yes Nelwyn Salisbury, MD  mometasone (NASONEX) 50 MCG/ACT nasal spray Place 2 sprays into the nose daily. 12/21/12  Yes Nelwyn Salisbury, MD  montelukast (SINGULAIR) 10 MG tablet Take 1 tablet (10 mg total) by  mouth at bedtime. 12/21/12  Yes Nelwyn Salisbury, MD  oxybutynin (DITROPAN-XL) 10 MG 24 hr tablet Take 10 mg by mouth daily.   Yes Historical Provider, MD  rOPINIRole (REQUIP) 1 MG tablet Take 1 mg by mouth at bedtime.  11/14/12  Yes Nelwyn Salisbury, MD  zolpidem (AMBIEN) 10 MG tablet Take 10 mg by mouth at bedtime as needed for sleep.   Yes Historical Provider, MD  glucose blood (RELION PRIME TEST) test strip Use as instructed 05/11/13   Nelwyn Salisbury, MD   Physical Exam: Filed Vitals:   06/09/13 1316 06/09/13 1603  BP: 141/91 137/92  Pulse: 101 98  Temp: 97.6 F (36.4 C)   TempSrc: Oral   Resp: 24 22  SpO2: 100% 100%      General:  Mild distress, no fever, AAOX3; cooperative with examination  Eyes: PERRL, EOMI, no icterus, no nystagmus  ENT: no erythema, no exudates; no thrush   Neck: no JVD, no brutis  Cardiovascular: Mild tachycardia, no rubs or gallops  Respiratory: CTA, bilaterally  Abdomen: soft, NT, ND, positive BS  Skin: no open wounds; patient reports some redness and itching on her arms; but w/o signs of cellulitic changes  Musculoskeletal: no edema, no cyanosis  Psychiatric: mood stable  Neurologic: AAOX3, CN intact, no focal motor or sensory deficit on exam; normal finger to nose; MS 4/5 bilaterally  Labs on Admission:  Basic Metabolic Panel:  Recent Labs Lab 06/09/13 1435 06/09/13 1444  NA 123* 129*  K 4.6 4.7  CL 88* 107  CO2 <7*  --   GLUCOSE 592* 598*  BUN 19 21  CREATININE 0.54 0.50  CALCIUM 9.2  --    Liver Function Tests:  Recent Labs Lab 06/09/13 1435  AST 13  ALT 17  ALKPHOS 172*  BILITOT 0.3  PROT 8.5*  ALBUMIN 3.9    Recent Labs Lab 06/09/13 1435  LIPASE 25   CBC:  Recent Labs Lab 06/09/13 1435 06/09/13 1444  WBC 24.0*  --   NEUTROABS 21.6*  --   HGB 15.4* 17.3*  HCT 44.6 51.0*  MCV 80.5  --   PLT 299  --    CBG:  Recent Labs Lab 06/09/13 1313 06/09/13 1552  GLUCAP >600* 499*    Radiological Exams on Admission: Dg Abd Acute W/chest  06/09/2013   *RADIOLOGY REPORT*  Clinical Data: Abdominal pain.  Nausea.  Diabetes.  ACUTE ABDOMEN SERIES (ABDOMEN 2 VIEW & CHEST 1 VIEW)  Comparison: 02/27/2013  Findings: Asymmetric sclerosis of the left posterolateral sixth rib.  The patient is rotated to the left on today's exam, resulting in reduced diagnostic sensitivity and specificity.  Low lung volumes are present, causing crowding of the pulmonary vasculature. Cardiac and mediastinal contours appear unremarkable.  Lateral decubitus view of the abdomen demonstrates no abnormal air- fluid level although the stomach appears distended.   Formed stool noted in the ascending colon, descending colon, and sigmoid colon. The small bowel is relatively gasless.  IMPRESSION: 1.  Distended stomach with relatively gasless small bowel.  This appearance is nonspecific and may simply represent a normal variation, but the proximal obstruction is not confidently excluded. 2.  Sclerotic left sixth posterior rib, probably due to a healing rib fracture.  If the patient has a history malignancy with predilection for bony spread, further workup by chest CT or bone scan might be warranted.   Original Report Authenticated By: Gaylyn Rong, M.D.    Assessment/Plan 1-DKA (diabetic ketoacidoses): appears to be  secondary to medication non-compliance. -will admit to stepdown -IVF's, IV insulin per DKA protocol -electrolytes repletion -will check A1C -NPO status except for sips with meds and ice chips -lipase is negative; CXR w/o signs of infection. Will cycle CE'z and will check UA -will transition to long acting insulin and SSI once bicarb > 18, anion gap is close and CBG's < 200 for 4 consecutive checks.  2-DEPRESSION:continue home medications (effexor)  3-HYPERTENSION:currently stable. Will monitor and start tx if needed, at home patient was just following heart healthy diet.  4-ASTHMA: no wheezing. Increase RR due to metabolic acidosis. Will continue home maintenance inhalers and PRN nebulizer. Will continue singulair  5-GERD: continue PPI  6-Sleep apnea: continue CPAP at bedtime  7-Hyponatremia: due to hyperglycemia. Will monitor. Receiving IVF's  8-Leukocytosis:due to demargination most likely. No signs of infection. Will follow WBC's trend.  9-??suicidal thoughts: according to nurse; patient's cousin reports patient has experienced at some point some suicidal thoughts. Patient do note report any of this to me, ED physician or any other staff on admission. In fact, the patient cousin forgot to mentioned anything as well. At this point  patient will in close observation unit (stepdown) for her DKA tx. Once stabilize will discuss more about this situation, will involve SW and psychiatry service.  DVT: heparin.   Code Status: Full Family Communication: Cousin at bedside Disposition Plan:stepdown bed, inpatient; LOS > 2 midnights   Time spent: 60 minutes  Lelani Garnett [facility] Pager [phone]  If 7PM-7AM, please contact night-coverage [email or web address] Password [email or web address] 06/09/2013, 4:53 PM

## 2013-06-09 NOTE — ED Notes (Signed)
Critical blood glucose and serum total CO2 given to Amy, RN at this time.  She states she will notify Dr. Gwenlyn Perking of these results. Pt. Has just been put on Insulin drip with Glucostablizer.

## 2013-06-09 NOTE — Progress Notes (Signed)
Pt currently refusing to go on CPAP at this time, RN aware. Will call if this changes.

## 2013-06-10 DIAGNOSIS — E1110 Type 2 diabetes mellitus with ketoacidosis without coma: Secondary | ICD-10-CM | POA: Diagnosis not present

## 2013-06-10 DIAGNOSIS — F411 Generalized anxiety disorder: Secondary | ICD-10-CM

## 2013-06-10 DIAGNOSIS — K219 Gastro-esophageal reflux disease without esophagitis: Secondary | ICD-10-CM | POA: Diagnosis not present

## 2013-06-10 DIAGNOSIS — F329 Major depressive disorder, single episode, unspecified: Secondary | ICD-10-CM | POA: Diagnosis not present

## 2013-06-10 DIAGNOSIS — E871 Hypo-osmolality and hyponatremia: Secondary | ICD-10-CM | POA: Diagnosis not present

## 2013-06-10 DIAGNOSIS — E876 Hypokalemia: Secondary | ICD-10-CM

## 2013-06-10 LAB — CBC
Hemoglobin: 11.9 g/dL — ABNORMAL LOW (ref 12.0–15.0)
RBC: 4.38 MIL/uL (ref 3.87–5.11)
WBC: 14.6 10*3/uL — ABNORMAL HIGH (ref 4.0–10.5)

## 2013-06-10 LAB — BASIC METABOLIC PANEL
BUN: 12 mg/dL (ref 6–23)
BUN: 10 mg/dL (ref 6–23)
BUN: 7 mg/dL (ref 6–23)
BUN: 6 mg/dL (ref 6–23)
CO2: 10 mEq/L — CL (ref 19–32)
CO2: 14 mEq/L — ABNORMAL LOW (ref 19–32)
CO2: 17 mEq/L — ABNORMAL LOW (ref 19–32)
CO2: 18 mEq/L — ABNORMAL LOW (ref 19–32)
Calcium: 8.7 mg/dL (ref 8.4–10.5)
Calcium: 9 mg/dL (ref 8.4–10.5)
Chloride: 105 mEq/L (ref 96–112)
Chloride: 105 mEq/L (ref 96–112)
Chloride: 104 mEq/L (ref 96–112)
Chloride: 103 mEq/L (ref 96–112)
Creatinine, Ser: 0.31 mg/dL — ABNORMAL LOW (ref 0.50–1.10)
Creatinine, Ser: 0.29 mg/dL — ABNORMAL LOW (ref 0.50–1.10)
GFR calc Af Amer: >90 mL/min (ref >90)
GFR calc Af Amer: >90 mL/min (ref >90)
GFR calc non Af Amer: >90 mL/min (ref >90)
GFR calc non Af Amer: >90 mL/min (ref >90)
GFR calc non Af Amer: >90 mL/min (ref >90)
Glucose, Bld: 130 mg/dL — ABNORMAL HIGH (ref 70–99)
Glucose, Bld: 150 mg/dL — ABNORMAL HIGH (ref 70–99)
Glucose, Bld: 185 mg/dL — ABNORMAL HIGH (ref 70–99)
Glucose, Bld: 218 mg/dL — ABNORMAL HIGH (ref 70–99)
Potassium: 3.4 mEq/L — ABNORMAL LOW (ref 3.5–5.1)
Potassium: 3.7 mEq/L (ref 3.5–5.1)
Potassium: 3 mEq/L — ABNORMAL LOW (ref 3.5–5.1)
Potassium: 2.9 mEq/L — ABNORMAL LOW (ref 3.5–5.1)
Sodium: 130 mEq/L — ABNORMAL LOW (ref 135–145)
Sodium: 131 mEq/L — ABNORMAL LOW (ref 135–145)
Sodium: 132 mEq/L — ABNORMAL LOW (ref 135–145)
Sodium: 132 mEq/L — ABNORMAL LOW (ref 135–145)

## 2013-06-10 LAB — GLUCOSE, CAPILLARY
Glucose-Capillary: 116 mg/dL — ABNORMAL HIGH (ref 70–99)
Glucose-Capillary: 117 mg/dL — ABNORMAL HIGH (ref 70–99)
Glucose-Capillary: 130 mg/dL — ABNORMAL HIGH (ref 70–99)
Glucose-Capillary: 143 mg/dL — ABNORMAL HIGH (ref 70–99)
Glucose-Capillary: 150 mg/dL — ABNORMAL HIGH (ref 70–99)
Glucose-Capillary: 168 mg/dL — ABNORMAL HIGH (ref 70–99)
Glucose-Capillary: 176 mg/dL — ABNORMAL HIGH (ref 70–99)
Glucose-Capillary: 177 mg/dL — ABNORMAL HIGH (ref 70–99)
Glucose-Capillary: 214 mg/dL — ABNORMAL HIGH (ref 70–99)
Glucose-Capillary: 228 mg/dL — ABNORMAL HIGH (ref 70–99)

## 2013-06-10 LAB — TROPONIN I
Troponin I: <0.3 ng/mL (ref <0.30)
Troponin I: <0.3 ng/mL (ref <0.30)

## 2013-06-10 MED ORDER — POTASSIUM CHLORIDE 10 MEQ/100ML IV SOLN
10.0000 meq | INTRAVENOUS | Status: AC
  Administered 2013-06-10 (×2): 10 meq via INTRAVENOUS
  Filled 2013-06-10: qty 200

## 2013-06-10 MED ORDER — POTASSIUM CHLORIDE 10 MEQ/100ML IV SOLN
INTRAVENOUS | Status: AC
  Filled 2013-06-10: qty 100

## 2013-06-10 MED ORDER — POTASSIUM CHLORIDE 10 MEQ/100ML IV SOLN
10.0000 meq | INTRAVENOUS | Status: AC
  Administered 2013-06-10 (×4): 10 meq via INTRAVENOUS
  Filled 2013-06-10: qty 300

## 2013-06-10 MED ORDER — POTASSIUM CHLORIDE 10 MEQ/100ML IV SOLN
10.0000 meq | INTRAVENOUS | Status: AC
  Administered 2013-06-10 (×3): 10 meq via INTRAVENOUS
  Filled 2013-06-10: qty 300

## 2013-06-10 NOTE — Progress Notes (Signed)
[facility] PROGRESS NOTE  Genoveva Singleton WUJ:[id number] DOB: 1956/03/02 DOA: 06/09/2013 PCP: Nelwyn Salisbury, MD  Assessment/Plan: 1-DKA (diabetic ketoacidoses): appears to be secondary to medication non-compliance.  -continue tx in stepdown  -IVF's, IV insulin per DKA protocol  -continue electrolytes repletion (potassium is 3.4) -A1C pending -NPO status except for sips with meds and ice chips  -lipase is negative; CXR w/o signs of infection. Will cycle CE'z and will check UA  -will transition to long acting insulin and SSI once bicarb > 18, anion gap is close and CBG's < 200 for 4 consecutive checks.  -Anion Gap is currently 17; patient CO2 10  2-Hx of DEPRESSION: continue home medications (effexor). Recently mother passed away and she is grieving from that, depression has worsening since. Intermittent thoughts about been dead and SI. -psychiatry consulted -currently denying suicidal thoughts   3-HYPERTENSION: currently stable. Will monitor and start tx if needed, at home patient was just following heart healthy diet.   4-ASTHMA: no wheezing. Increase Respiratory Rate resolved. (was due to metabolic acidosis).  -Will continue home maintenance inhalers and PRN nebulizer.  -Will continue singulair   5-GERD: continue PPI   6-Sleep apnea: continue CPAP at bedtime. Patient encourage to use machine.  7-Hyponatremia: due to hyperglycemia. Will monitor. Receiving IVF's. Na level 132  8-Leukocytosis: due to demargination/stress response most likely. No signs of infection.  -WBC is 14.6 today -Will follow WBC's trend.   9-Suicidal thoughts: no active currently after discussing with patient. She expressed losing her mother recently and not having good grief support. Will involved psychiatry for further recommendations. Since she is no having any active SI will hold on sitters. See further assessment on Depression problem.  10-Hypokalemia: will replete. Mg and phosphorus   WNL.  DVT: heparin.  Code Status: Full Family Communication: no family at bedside Disposition Plan: will continue treatment in stepdown.   Consultants:  Psychiatry   Procedures:  See below for x-ray reports  Antibiotics:  none  HPI/Subjective: Patient w/o suicidal thoughts currently; afebrile. Feeling better overall. Her tachypnea (driven by metabolic acidosis due to DKA) improved/resolved. Anion Gap is 17, CO2 10  Objective: Filed Vitals:   06/10/13 0600 06/10/13 0700 06/10/13 0739 06/10/13 0920  BP: 86/50 106/63 108/66   Pulse: 81 73 79   Temp:   98.4 F (36.9 C)   TempSrc:   Oral   Resp: 18 18 18    Height:      Weight:      SpO2: 100% 100% 100% 100%    Intake/Output Summary (Last 24 hours) at 06/10/13 0925 Last data filed at 06/10/13 0745  Gross per 24 hour  Intake 2072.9 ml  Output   2150 ml  Net  -77.1 ml   Filed Weights   06/09/13 1800  Weight: 63.64 kg (140 lb 4.8 oz)    Exam:   General:  NAD, afebrile; no suicidal thoughts currently  Cardiovascular: S1 and S2, no rubs or gallops  Respiratory: CTA bilaterally  Abdomen: soft, NT, ND, positive BS.   Musculoskeletal: no edema or cyanosis  Data Reviewed: Basic Metabolic Panel:  Recent Labs Lab 06/09/13 1435 06/09/13 1444 06/09/13 1916 06/09/13 2343 06/10/13 0234 06/10/13 0803  NA 123* 129*  --  130* 132* 132*  K 4.6 4.7  --  3.6 3.4* 3.7  CL 88* 107  --  103 107 105  CO2 <7*  --   --  9* 10* 10*  GLUCOSE 592* 598*  --  218* 153* 201*  BUN 19 21  --  13 12 10   CREATININE 0.54 0.50  --  0.38* 0.35* 0.31*  CALCIUM 9.2  --   --  8.7 8.8 8.5  MG  --   --  1.3*  --   --   --   PHOS  --   --  2.3  --   --   --    Liver Function Tests:  Recent Labs Lab 06/09/13 1435  AST 13  ALT 17  ALKPHOS 172*  BILITOT 0.3  PROT 8.5*  ALBUMIN 3.9    Recent Labs Lab 06/09/13 1435  LIPASE 25   CBC:  Recent Labs Lab 06/09/13 1435 06/09/13 1444 06/10/13 0803  WBC 24.0*  --  14.6*   NEUTROABS 21.6*  --   --   HGB 15.4* 17.3* 11.9*  HCT 44.6 51.0* 34.1*  MCV 80.5  --  77.9*  PLT 299  --  131*   Cardiac Enzymes:  Recent Labs Lab 06/09/13 1916 06/09/13 2343 06/10/13 0803  TROPONINI <0.30 <0.30 <0.30   CBG:  Recent Labs Lab 06/10/13 0208 06/10/13 0312 06/10/13 0417 06/10/13 0528 06/10/13 0644  GLUCAP 117* 114* 123* 116* 141*    Recent Results (from the past 240 hour(s))  MRSA PCR SCREENING     Status: Abnormal   Collection Time    06/09/13  5:58 PM      Result Value Range Status   MRSA by PCR POSITIVE (*) NEGATIVE Final   Comment:            The GeneXpert MRSA Assay (FDA     approved for NASAL specimens     only), is one component of a     comprehensive MRSA colonization     surveillance program. It is not     intended to diagnose MRSA     infection nor to guide or     monitor treatment for     MRSA infections.     RESULT CALLED TO, READ BACK BY AND VERIFIED WITH:     Marjory Lies [id number] @ 1907 BY J SCOTTON     Studies: Dg Abd Acute W/chest  06/09/2013   *RADIOLOGY REPORT*  Clinical Data: Abdominal pain.  Nausea.  Diabetes.  ACUTE ABDOMEN SERIES (ABDOMEN 2 VIEW & CHEST 1 VIEW)  Comparison: 02/27/2013  Findings: Asymmetric sclerosis of the left posterolateral sixth rib.  The patient is rotated to the left on today's exam, resulting in reduced diagnostic sensitivity and specificity.  Low lung volumes are present, causing crowding of the pulmonary vasculature. Cardiac and mediastinal contours appear unremarkable.  Lateral decubitus view of the abdomen demonstrates no abnormal air- fluid level although the stomach appears distended.  Formed stool noted in the ascending colon, descending colon, and sigmoid colon. The small bowel is relatively gasless.  IMPRESSION: 1.  Distended stomach with relatively gasless small bowel.  This appearance is nonspecific and may simply represent a normal variation, but the proximal obstruction is not confidently  excluded. 2.  Sclerotic left sixth posterior rib, probably due to a healing rib fracture.  If the patient has a history malignancy with predilection for bony spread, further workup by chest CT or bone scan might be warranted.   Original Report Authenticated By: Gaylyn Rong, M.D.    Scheduled Meds: . budesonide-formoterol  2 puff Inhalation BID  . Chlorhexidine Gluconate Cloth  6 each Topical Q0600  . fluticasone  2 spray Each Nare Daily  . heparin  5,000 Units Subcutaneous Q8H  .  imipramine  50 mg Oral QHS  . insulin regular  0-10 Units Intravenous TID WC  . loratadine  10 mg Oral Daily  . montelukast  10 mg Oral QHS  . mupirocin ointment  1 application Nasal BID  . oxybutynin  10 mg Oral Daily  . pantoprazole  40 mg Oral BID  . potassium chloride  10 mEq Intravenous Q1 Hr x 3  . rOPINIRole  1 mg Oral QHS  . venlafaxine XR  75 mg Oral Q breakfast   Continuous Infusions: . sodium chloride 125 mL/hr (06/10/13 0104)  . sodium chloride 125 mL/hr at 06/09/13 1820  . dextrose 5 % and 0.45% NaCl 125 mL (06/10/13 0745)  . dextrose 5 % and 0.45% NaCl    . insulin (NOVOLIN-R) infusion 0.8 Units/hr (06/10/13 8469)    Principal Problem:   DKA (diabetic ketoacidoses) Active Problems:   DEPRESSION   HYPERTENSION   ASTHMA   GERD   Sleep apnea   Hyponatremia   Leukocytosis    Time spent: > 30 minutes    Jasmane Brockway  [facility] Pager [phone]. If 7PM-7AM, please contact night-coverage at [email or web address], password [facility] 06/10/2013, 9:25 AM  LOS: 1 day

## 2013-06-10 NOTE — Consult Note (Signed)
Reason for Consult: SI? Referring Physician:   Avenell [name] is an 57 y.o. female.  HPI:   Isabel [name] is a 57 y.o. female with PMH significant for HTN, DM type 2 on insulin; GERD, asthma, sleep apnea and depression; admitted through ED secondary to generalized weakness, Nausea and elevated blood sugar. Patient reports not feeling well in the last couple of days prior to admission and has not been compliant with insulin as she has not been eating much. On the day of admission she noticed she was breathing faster than usual, feeling really thirsty and with abd discomfort; CBG's at home too high to read; patient decide to come to ED. In ED was found with DKA.   Seen today. Reports being depressed (sad mood, SI thoughts, hopelessness ) for years but more in the last few months now. Lost mother in recent past and reports she has hard time coping with it these days. Has free floating anxity for years. Reports having SI thought in recent past and feels unsafe at home. Reports telling her family about SI thoughts recently. Willing to get help now.    Past Medical History  Diagnosis Date  . Allergy   . Asthma   . Depression   . GERD (gastroesophageal reflux disease)   . Hypertension   . Cystitis, interstitial   . Pneumonia   . Diabetes mellitus without complication   . Pancreatic pseudocyst   . Sleep apnea     wears a CPAP at night     Past Surgical History  Procedure Laterality Date  . Cystgastrostomy  09-02-12    per Dr. Cherly Hensen at [facility]   . Cholecystectomy      Family History  Problem Relation Age of Onset  . Cancer      other    Social History:  reports that she has never smoked. She has never used smokeless tobacco. She reports that she does not drink alcohol or use illicit drugs.  Allergies:  Allergies  Allergen Reactions  . Aspirin     REACTION: unspecified  . Latex     rash  . Penicillins     REACTION: unspecified  . Povidone     REACTION: unspecified  .  Sulfamethoxazole     REACTION: unspecified    Medications: I have reviewed the patient's current medications.  Results for orders placed during the hospital encounter of 06/09/13 (from the past 48 hour(s))  GLUCOSE, CAPILLARY     Status: Abnormal   Collection Time    06/09/13  1:13 PM      Result Value Range   Glucose-Capillary >600 (*) 70 - 99 mg/dL   Comment 1 Notify RN    URINALYSIS, ROUTINE W REFLEX MICROSCOPIC     Status: Abnormal   Collection Time    06/09/13  1:43 PM      Result Value Range   Color, Urine YELLOW  YELLOW   APPearance CLOUDY (*) CLEAR   Specific Gravity, Urine 1.032 (*) 1.005 - 1.030   pH 5.0  5.0 - 8.0   Glucose, UA >1000 (*) NEGATIVE mg/dL   Hgb urine dipstick TRACE (*) NEGATIVE   Bilirubin Urine NEGATIVE  NEGATIVE   Ketones, ur >80 (*) NEGATIVE mg/dL   Protein, ur 30 (*) NEGATIVE mg/dL   Urobilinogen, UA 0.2  0.0 - 1.0 mg/dL   Nitrite NEGATIVE  NEGATIVE   Leukocytes, UA NEGATIVE  NEGATIVE  URINE MICROSCOPIC-ADD ON     Status: Abnormal   Collection  Time    06/09/13  1:43 PM      Result Value Range   Squamous Epithelial / LPF RARE  RARE   WBC, UA 0-2  <3 WBC/hpf   RBC / HPF 0-2  <3 RBC/hpf   Bacteria, UA FEW (*) RARE  BLOOD GAS, VENOUS     Status: Abnormal   Collection Time    06/09/13  2:31 PM      Result Value Range   pH, Ven 6.962 (*) 7.250 - 7.300   Comment: CRITICAL RESULT CALLED TO, READ BACK BY AND VERIFIED WITH:     DR. Fonnie Jarvis, MD AT 1443 BY DEDREA WALTERS RRT, RCP ON 7     CRITICAL RESULT CALLED TO, READ BACK BY AND VERIFIED WITH:     DR. Fonnie Jarvis, MD AT 1443 BY DEDREA WALTERS RRT, RCP ON 06/09/2013   pCO2, Ven 20.2 (*) 45.0 - 50.0 mmHg   pO2, Ven 44.0  30.0 - 45.0 mmHg   Bicarbonate 4.3 (*) 20.0 - 24.0 mEq/L   TCO2 4.4  0 - 100 mmol/L   Acid-base deficit 28.5 (*) 0.0 - 2.0 mmol/L   O2 Saturation 76.0     Patient temperature 98.6     Drawn by [id number]     Sample type VENOUS    CBC WITH DIFFERENTIAL     Status: Abnormal   Collection  Time    06/09/13  2:35 PM      Result Value Range   WBC 24.0 (*) 4.0 - 10.5 K/uL   RBC 5.54 (*) 3.87 - 5.11 MIL/uL   Hemoglobin 15.4 (*) 12.0 - 15.0 g/dL   HCT 57.8  46.9 - 62.9 %   MCV 80.5  78.0 - 100.0 fL   MCH 27.8  26.0 - 34.0 pg   MCHC 34.5  30.0 - 36.0 g/dL   RDW 52.8  41.3 - 24.4 %   Platelets 299  150 - 400 K/uL   Neutrophils Relative % 90 (*) 43 - 77 %   Lymphocytes Relative 5 (*) 12 - 46 %   Monocytes Relative 5  3 - 12 %   Eosinophils Relative 0  0 - 5 %   Basophils Relative 0  0 - 1 %   Neutro Abs 21.6 (*) 1.7 - 7.7 K/uL   Lymphs Abs 1.2  0.7 - 4.0 K/uL   Monocytes Absolute 1.2 (*) 0.1 - 1.0 K/uL   Eosinophils Absolute 0.0  0.0 - 0.7 K/uL   Basophils Absolute 0.0  0.0 - 0.1 K/uL   Smear Review PLATELET CLUMPS NOTED ON SMEAR     Comment: LARGE PLATELETS PRESENT  COMPREHENSIVE METABOLIC PANEL     Status: Abnormal   Collection Time    06/09/13  2:35 PM      Result Value Range   Sodium 123 (*) 135 - 145 mEq/L   Potassium 4.6  3.5 - 5.1 mEq/L   Chloride 88 (*) 96 - 112 mEq/L   CO2 <7 (*) 19 - 32 mEq/L   Comment: CRITICAL RESULT CALLED TO, READ BACK BY AND VERIFIED WITH:     SMITH,T AT 1554 ON [id number] BY POTEAT,S   Glucose, Bld 592 (*) 70 - 99 mg/dL   Comment: CRITICAL RESULT CALLED TO, READ BACK BY AND VERIFIED WITH:     SMITH,T AT 1554 ON [id number] BY POTEAT,S   BUN 19  6 - 23 mg/dL   Creatinine, Ser 0.34  0.50 - 1.10 mg/dL   Calcium 9.2  8.4 -  10.5 mg/dL   Total Protein 8.5 (*) 6.0 - 8.3 g/dL   Albumin 3.9  3.5 - 5.2 g/dL   AST 13  0 - 37 U/L   ALT 17  0 - 35 U/L   Alkaline Phosphatase 172 (*) 39 - 117 U/L   Total Bilirubin 0.3  0.3 - 1.2 mg/dL   GFR calc non Af Amer >90  >90 mL/min   GFR calc Af Amer >90  >90 mL/min   Comment:            The eGFR has been calculated     using the CKD EPI equation.     This calculation has not been     validated in all clinical     situations.     eGFR's persistently     <90 mL/min signify     possible Chronic Kidney  Disease.  LIPASE, BLOOD     Status: None   Collection Time    06/09/13  2:35 PM      Result Value Range   Lipase 25  11 - 59 U/L  KETONES, QUALITATIVE     Status: Abnormal   Collection Time    06/09/13  2:35 PM      Result Value Range   Acetone, Bld LARGE (*) NEGATIVE  HEMOGLOBIN A1C     Status: Abnormal   Collection Time    06/09/13  2:35 PM      Result Value Range   Hemoglobin A1C 13.0 (*) <5.7 %   Comment: (NOTE)                                                                               According to the ADA Clinical Practice Recommendations for 2011, when     HbA1c is used as a screening test:      >=6.5%   Diagnostic of Diabetes Mellitus               (if abnormal result is confirmed)     5.7-6.4%   Increased risk of developing Diabetes Mellitus     References:Diagnosis and Classification of Diabetes Mellitus,Diabetes     Care,2011,34(Suppl 1):S62-S69 and Standards of Medical Care in             Diabetes - 2011,Diabetes Care,2011,34 (Suppl 1):S11-S61.   Mean Plasma Glucose 326 (*) <117 mg/dL  POCT I-STAT, CHEM 8     Status: Abnormal   Collection Time    06/09/13  2:44 PM      Result Value Range   Sodium 129 (*) 135 - 145 mEq/L   Potassium 4.7  3.5 - 5.1 mEq/L   Chloride 107  96 - 112 mEq/L   BUN 21  6 - 23 mg/dL   Creatinine, Ser 7.84  0.50 - 1.10 mg/dL   Glucose, Bld 696 (*) 70 - 99 mg/dL   Calcium, Ion 2.95 (*) 1.12 - 1.23 mmol/L   TCO2 6  0 - 100 mmol/L   Hemoglobin 17.3 (*) 12.0 - 15.0 g/dL   HCT 28.4 (*) 13.2 - 44.0 %   Comment NOTIFIED PHYSICIAN    GLUCOSE, CAPILLARY     Status: Abnormal   Collection Time  06/09/13  3:52 PM      Result Value Range   Glucose-Capillary 499 (*) 70 - 99 mg/dL  GLUCOSE, CAPILLARY     Status: Abnormal   Collection Time    06/09/13  5:07 PM      Result Value Range   Glucose-Capillary 416 (*) 70 - 99 mg/dL  MRSA PCR SCREENING     Status: Abnormal   Collection Time    06/09/13  5:58 PM      Result Value Range   MRSA by  PCR POSITIVE (*) NEGATIVE   Comment:            The GeneXpert MRSA Assay (FDA     approved for NASAL specimens     only), is one component of a     comprehensive MRSA colonization     surveillance program. It is not     intended to diagnose MRSA     infection nor to guide or     monitor treatment for     MRSA infections.     RESULT CALLED TO, READ BACK BY AND VERIFIED WITH:     Marjory Lies [id number] @ 1907 BY J SCOTTON  GLUCOSE, CAPILLARY     Status: Abnormal   Collection Time    06/09/13  6:05 PM      Result Value Range   Glucose-Capillary 374 (*) 70 - 99 mg/dL  PHOSPHORUS     Status: None   Collection Time    06/09/13  7:16 PM      Result Value Range   Phosphorus 2.3  2.3 - 4.6 mg/dL  MAGNESIUM     Status: Abnormal   Collection Time    06/09/13  7:16 PM      Result Value Range   Magnesium 1.3 (*) 1.5 - 2.5 mg/dL  TROPONIN I     Status: None   Collection Time    06/09/13  7:16 PM      Result Value Range   Troponin I <0.30  <0.30 ng/mL   Comment:            Due to the release kinetics of cTnI,     a negative result within the first hours     of the onset of symptoms does not rule out     myocardial infarction with certainty.     If myocardial infarction is still suspected,     repeat the test at appropriate intervals.  GLUCOSE, CAPILLARY     Status: Abnormal   Collection Time    06/09/13  7:22 PM      Result Value Range   Glucose-Capillary 277 (*) 70 - 99 mg/dL  GLUCOSE, CAPILLARY     Status: Abnormal   Collection Time    06/09/13  8:29 PM      Result Value Range   Glucose-Capillary 245 (*) 70 - 99 mg/dL  GLUCOSE, CAPILLARY     Status: Abnormal   Collection Time    06/09/13  9:27 PM      Result Value Range   Glucose-Capillary 228 (*) 70 - 99 mg/dL  GLUCOSE, CAPILLARY     Status: Abnormal   Collection Time    06/09/13 10:37 PM      Result Value Range   Glucose-Capillary 195 (*) 70 - 99 mg/dL   Comment 1 Documented in Chart     Comment 2 Notify RN     TROPONIN I     Status: None   Collection Time  06/09/13 11:43 PM      Result Value Range   Troponin I <0.30  <0.30 ng/mL   Comment:            Due to the release kinetics of cTnI,     a negative result within the first hours     of the onset of symptoms does not rule out     myocardial infarction with certainty.     If myocardial infarction is still suspected,     repeat the test at appropriate intervals.  BASIC METABOLIC PANEL     Status: Abnormal   Collection Time    06/09/13 11:43 PM      Result Value Range   Sodium 130 (*) 135 - 145 mEq/L   Potassium 3.6  3.5 - 5.1 mEq/L   Comment: DELTA CHECK NOTED   Chloride 103  96 - 112 mEq/L   CO2 9 (*) 19 - 32 mEq/L   Comment: CRITICAL RESULT CALLED TO, READ BACK BY AND VERIFIED WITH:     CALBIGHT RN AT 0025 ON [id number] BY DLONG   Glucose, Bld 218 (*) 70 - 99 mg/dL   BUN 13  6 - 23 mg/dL   Creatinine, Ser 0.45 (*) 0.50 - 1.10 mg/dL   Calcium 8.7  8.4 - 40.9 mg/dL   GFR calc non Af Amer >90  >90 mL/min   GFR calc Af Amer >90  >90 mL/min   Comment:            The eGFR has been calculated     using the CKD EPI equation.     This calculation has not been     validated in all clinical     situations.     eGFR's persistently     <90 mL/min signify     possible Chronic Kidney Disease.  GLUCOSE, CAPILLARY     Status: Abnormal   Collection Time    06/09/13 11:52 PM      Result Value Range   Glucose-Capillary 177 (*) 70 - 99 mg/dL   Comment 1 Documented in Chart     Comment 2 Notify RN    GLUCOSE, CAPILLARY     Status: Abnormal   Collection Time    06/10/13  1:03 AM      Result Value Range   Glucose-Capillary 143 (*) 70 - 99 mg/dL  GLUCOSE, CAPILLARY     Status: Abnormal   Collection Time    06/10/13  2:08 AM      Result Value Range   Glucose-Capillary 117 (*) 70 - 99 mg/dL  BASIC METABOLIC PANEL     Status: Abnormal   Collection Time    06/10/13  2:34 AM      Result Value Range   Sodium 132 (*) 135 - 145 mEq/L    Potassium 3.4 (*) 3.5 - 5.1 mEq/L   Chloride 107  96 - 112 mEq/L   CO2 10 (*) 19 - 32 mEq/L   Comment: CRITICAL RESULT CALLED TO, READ BACK BY AND VERIFIED WITH:     CALBIGHT RN AT 0335 ON [id number] BY DLONG   Glucose, Bld 153 (*) 70 - 99 mg/dL   BUN 12  6 - 23 mg/dL   Creatinine, Ser 7.82 (*) 0.50 - 1.10 mg/dL   Calcium 8.8  8.4 - 95.6 mg/dL   GFR calc non Af Amer >90  >90 mL/min   GFR calc Af Amer >90  >90 mL/min   Comment:  The eGFR has been calculated     using the CKD EPI equation.     This calculation has not been     validated in all clinical     situations.     eGFR's persistently     <90 mL/min signify     possible Chronic Kidney Disease.  GLUCOSE, CAPILLARY     Status: Abnormal   Collection Time    06/10/13  3:12 AM      Result Value Range   Glucose-Capillary 114 (*) 70 - 99 mg/dL  GLUCOSE, CAPILLARY     Status: Abnormal   Collection Time    06/10/13  4:17 AM      Result Value Range   Glucose-Capillary 123 (*) 70 - 99 mg/dL  GLUCOSE, CAPILLARY     Status: Abnormal   Collection Time    06/10/13  5:28 AM      Result Value Range   Glucose-Capillary 116 (*) 70 - 99 mg/dL  GLUCOSE, CAPILLARY     Status: Abnormal   Collection Time    06/10/13  6:44 AM      Result Value Range   Glucose-Capillary 141 (*) 70 - 99 mg/dL  GLUCOSE, CAPILLARY     Status: Abnormal   Collection Time    06/10/13  7:51 AM      Result Value Range   Glucose-Capillary 205 (*) 70 - 99 mg/dL   Comment 1 Documented in Chart     Comment 2 Notify RN    TROPONIN I     Status: None   Collection Time    06/10/13  8:03 AM      Result Value Range   Troponin I <0.30  <0.30 ng/mL   Comment:            Due to the release kinetics of cTnI,     a negative result within the first hours     of the onset of symptoms does not rule out     myocardial infarction with certainty.     If myocardial infarction is still suspected,     repeat the test at appropriate intervals.  CBC     Status: Abnormal    Collection Time    06/10/13  8:03 AM      Result Value Range   WBC 14.6 (*) 4.0 - 10.5 K/uL   RBC 4.38  3.87 - 5.11 MIL/uL   Hemoglobin 11.9 (*) 12.0 - 15.0 g/dL   Comment: DELTA CHECK NOTED     REPEATED TO VERIFY     RESULT CHECKED   HCT 34.1 (*) 36.0 - 46.0 %   MCV 77.9 (*) 78.0 - 100.0 fL   MCH 27.2  26.0 - 34.0 pg   MCHC 34.9  30.0 - 36.0 g/dL   RDW 84.6  96.2 - 95.2 %   Platelets 131 (*) 150 - 400 K/uL   Comment: DELTA CHECK NOTED     SPECIMEN CHECKED FOR CLOTS     REPEATED TO VERIFY  BASIC METABOLIC PANEL     Status: Abnormal   Collection Time    06/10/13  8:03 AM      Result Value Range   Sodium 132 (*) 135 - 145 mEq/L   Potassium 3.7  3.5 - 5.1 mEq/L   Chloride 105  96 - 112 mEq/L   CO2 10 (*) 19 - 32 mEq/L   Comment: CRITICAL RESULT CALLED TO, READ BACK BY AND VERIFIED WITH:     NEILLY  RICHARDSON,RN [id number] @ 0852 BY J SCOTTON   Glucose, Bld 201 (*) 70 - 99 mg/dL   BUN 10  6 - 23 mg/dL   Creatinine, Ser 9.62 (*) 0.50 - 1.10 mg/dL   Calcium 8.5  8.4 - 95.2 mg/dL   GFR calc non Af Amer >90  >90 mL/min   GFR calc Af Amer >90  >90 mL/min   Comment:            The eGFR has been calculated     using the CKD EPI equation.     This calculation has not been     validated in all clinical     situations.     eGFR's persistently     <90 mL/min signify     possible Chronic Kidney Disease.  GLUCOSE, CAPILLARY     Status: Abnormal   Collection Time    06/10/13  8:55 AM      Result Value Range   Glucose-Capillary 214 (*) 70 - 99 mg/dL   Comment 1 Notify RN     Comment 2 Documented in Chart     Comment 3 Glucose Stabilizer    GLUCOSE, CAPILLARY     Status: Abnormal   Collection Time    06/10/13 10:00 AM      Result Value Range   Glucose-Capillary 224 (*) 70 - 99 mg/dL   Comment 1 Documented in Chart     Comment 2 Notify RN    GLUCOSE, CAPILLARY     Status: Abnormal   Collection Time    06/10/13 11:09 AM      Result Value Range   Glucose-Capillary 192 (*) 70 -  99 mg/dL   Comment 1 Notify RN     Comment 2 Documented in Chart     Comment 3 Glucose Stabilizer    BASIC METABOLIC PANEL     Status: Abnormal   Collection Time    06/10/13 12:00 PM      Result Value Range   Sodium 131 (*) 135 - 145 mEq/L   Potassium 3.6  3.5 - 5.1 mEq/L   Chloride 105  96 - 112 mEq/L   CO2 14 (*) 19 - 32 mEq/L   Glucose, Bld 185 (*) 70 - 99 mg/dL   BUN 8  6 - 23 mg/dL   Creatinine, Ser 8.41 (*) 0.50 - 1.10 mg/dL   Calcium 9.0  8.4 - 32.4 mg/dL   GFR calc non Af Amer >90  >90 mL/min   GFR calc Af Amer >90  >90 mL/min   Comment:            The eGFR has been calculated     using the CKD EPI equation.     This calculation has not been     validated in all clinical     situations.     eGFR's persistently     <90 mL/min signify     possible Chronic Kidney Disease.  GLUCOSE, CAPILLARY     Status: Abnormal   Collection Time    06/10/13 12:21 PM      Result Value Range   Glucose-Capillary 168 (*) 70 - 99 mg/dL   Comment 1 Notify RN     Comment 2 Documented in Chart     Comment 3 Glucose Stabilizer    GLUCOSE, CAPILLARY     Status: Abnormal   Collection Time    06/10/13 12:40 PM      Result Value Range   Glucose-Capillary 176 (*)  70 - 99 mg/dL  GLUCOSE, CAPILLARY     Status: Abnormal   Collection Time    06/10/13  1:24 PM      Result Value Range   Glucose-Capillary 173 (*) 70 - 99 mg/dL   Comment 1 Notify RN     Comment 2 Documented in Chart     Comment 3 Glucose Stabilizer    GLUCOSE, CAPILLARY     Status: Abnormal   Collection Time    06/10/13  2:28 PM      Result Value Range   Glucose-Capillary 150 (*) 70 - 99 mg/dL   Comment 1 Documented in Chart     Comment 2 Notify RN     Comment 3 Glucose Stabilizer    GLUCOSE, CAPILLARY     Status: Abnormal   Collection Time    06/10/13  3:34 PM      Result Value Range   Glucose-Capillary 154 (*) 70 - 99 mg/dL   Comment 1 Documented in Chart     Comment 2 Notify RN    BASIC METABOLIC PANEL     Status:  Abnormal   Collection Time    06/10/13  4:02 PM      Result Value Range   Sodium 132 (*) 135 - 145 mEq/L   Potassium 3.0 (*) 3.5 - 5.1 mEq/L   Chloride 104  96 - 112 mEq/L   CO2 17 (*) 19 - 32 mEq/L   Glucose, Bld 150 (*) 70 - 99 mg/dL   BUN 7  6 - 23 mg/dL   Creatinine, Ser 4.69 (*) 0.50 - 1.10 mg/dL   Calcium 9.2  8.4 - 62.9 mg/dL   GFR calc non Af Amer >90  >90 mL/min   GFR calc Af Amer >90  >90 mL/min   Comment:            The eGFR has been calculated     using the CKD EPI equation.     This calculation has not been     validated in all clinical     situations.     eGFR's persistently     <90 mL/min signify     possible Chronic Kidney Disease.  GLUCOSE, CAPILLARY     Status: Abnormal   Collection Time    06/10/13  4:37 PM      Result Value Range   Glucose-Capillary 130 (*) 70 - 99 mg/dL   Comment 1 Documented in Chart     Comment 2 Notify RN    GLUCOSE, CAPILLARY     Status: Abnormal   Collection Time    06/10/13  5:42 PM      Result Value Range   Glucose-Capillary 144 (*) 70 - 99 mg/dL  GLUCOSE, CAPILLARY     Status: Abnormal   Collection Time    06/10/13  6:47 PM      Result Value Range   Glucose-Capillary 148 (*) 70 - 99 mg/dL  GLUCOSE, CAPILLARY     Status: Abnormal   Collection Time    06/10/13  7:47 PM      Result Value Range   Glucose-Capillary 122 (*) 70 - 99 mg/dL   Comment 1 Documented in Chart    BASIC METABOLIC PANEL     Status: Abnormal   Collection Time    06/10/13  8:10 PM      Result Value Range   Sodium 132 (*) 135 - 145 mEq/L   Potassium 2.9 (*) 3.5 - 5.1 mEq/L  Chloride 103  96 - 112 mEq/L   CO2 18 (*) 19 - 32 mEq/L   Glucose, Bld 130 (*) 70 - 99 mg/dL   BUN 6  6 - 23 mg/dL   Creatinine, Ser 1.61 (*) 0.50 - 1.10 mg/dL   Calcium 9.5  8.4 - 09.6 mg/dL   GFR calc non Af Amer >90  >90 mL/min   GFR calc Af Amer >90  >90 mL/min   Comment:            The eGFR has been calculated     using the CKD EPI equation.     This calculation has  not been     validated in all clinical     situations.     eGFR's persistently     <90 mL/min signify     possible Chronic Kidney Disease.  GLUCOSE, CAPILLARY     Status: Abnormal   Collection Time    06/10/13  8:53 PM      Result Value Range   Glucose-Capillary 121 (*) 70 - 99 mg/dL   Comment 1 Documented in Chart     Comment 2 Notify RN      Dg Abd Acute W/chest  06/09/2013   *RADIOLOGY REPORT*  Clinical Data: Abdominal pain.  Nausea.  Diabetes.  ACUTE ABDOMEN SERIES (ABDOMEN 2 VIEW & CHEST 1 VIEW)  Comparison: 02/27/2013  Findings: Asymmetric sclerosis of the left posterolateral sixth rib.  The patient is rotated to the left on today's exam, resulting in reduced diagnostic sensitivity and specificity.  Low lung volumes are present, causing crowding of the pulmonary vasculature. Cardiac and mediastinal contours appear unremarkable.  Lateral decubitus view of the abdomen demonstrates no abnormal air- fluid level although the stomach appears distended.  Formed stool noted in the ascending colon, descending colon, and sigmoid colon. The small bowel is relatively gasless.  IMPRESSION: 1.  Distended stomach with relatively gasless small bowel.  This appearance is nonspecific and may simply represent a normal variation, but the proximal obstruction is not confidently excluded. 2.  Sclerotic left sixth posterior rib, probably due to a healing rib fracture.  If the patient has a history malignancy with predilection for bony spread, further workup by chest CT or bone scan might be warranted.   Original Report Authenticated By: Gaylyn Rong, M.D.    ROS Blood pressure 117/85, pulse 90, temperature 97.9 F (36.6 C), temperature source Oral, resp. rate 18, height 5\' 7"  (1.702 m), weight 63.64 kg (140 lb 4.8 oz), SpO2 100.00%. Physical Exam  Mental Status Examination/Evaluation:  Appearance: on chair  Eye Contact:: poor  Speech: slow  Volume: low  Mood: depressed  Affect:  ristricted  Thought Process: organized  Orientation: Full  Thought Content: NO AVH  Suicidal Thoughts: No  Homicidal Thoughts: no  Memory: Recent; Poor  Judgement: Impaired  Insight: Lacking  Psychomotor Activity: Normal  Concentration: Fair  Recall: Fair  Akathisia: No  Assessment:  AXIS I: Depressive d/o nos, anxiety d/o nos,   AXIS II: Deferred  AXIS III: see emdical hx  ? ? ? ? ? ? ? ? ? ? ? ? ? ? ? AXIS IV: problems related to social environment, problems with access to health care services and problems with primary support group  AXIS V: 30  ?  Treatment Plan/Recommendations:  1.will recommend psy in pt for safety, further evaluation and treatment after medical clearance. reocmend TSH if not done before  2. Per pt she is not on any dep  meds. Her home list has Pristiq but she is on Effexor XR now. Will recommend to confirm her home meds  3. Will follow as needed  Wonda Cerise 06/10/2013, 10:01 PM

## 2013-06-11 ENCOUNTER — Telehealth: Payer: Self-pay | Admitting: Family Medicine

## 2013-06-11 DIAGNOSIS — F329 Major depressive disorder, single episode, unspecified: Secondary | ICD-10-CM | POA: Diagnosis not present

## 2013-06-11 DIAGNOSIS — J309 Allergic rhinitis, unspecified: Secondary | ICD-10-CM

## 2013-06-11 DIAGNOSIS — E119 Type 2 diabetes mellitus without complications: Secondary | ICD-10-CM | POA: Diagnosis not present

## 2013-06-11 DIAGNOSIS — E1110 Type 2 diabetes mellitus with ketoacidosis without coma: Secondary | ICD-10-CM | POA: Diagnosis not present

## 2013-06-11 LAB — GLUCOSE, CAPILLARY
Glucose-Capillary: 120 mg/dL — ABNORMAL HIGH (ref 70–99)
Glucose-Capillary: 186 mg/dL — ABNORMAL HIGH (ref 70–99)
Glucose-Capillary: 145 mg/dL — ABNORMAL HIGH (ref 70–99)
Glucose-Capillary: 111 mg/dL — ABNORMAL HIGH (ref 70–99)
Glucose-Capillary: 295 mg/dL — ABNORMAL HIGH (ref 70–99)
Glucose-Capillary: 327 mg/dL — ABNORMAL HIGH (ref 70–99)
Glucose-Capillary: 354 mg/dL — ABNORMAL HIGH (ref 70–99)

## 2013-06-11 LAB — BASIC METABOLIC PANEL
BUN: 5 mg/dL — ABNORMAL LOW (ref 6–23)
BUN: 4 mg/dL — ABNORMAL LOW (ref 6–23)
CO2: 18 mEq/L — ABNORMAL LOW (ref 19–32)
Calcium: 9.1 mg/dL (ref 8.4–10.5)
Calcium: 9 mg/dL (ref 8.4–10.5)
Chloride: 103 mEq/L (ref 96–112)
Creatinine, Ser: 0.27 mg/dL — ABNORMAL LOW (ref 0.50–1.10)
GFR calc non Af Amer: >90 mL/min (ref >90)
Glucose, Bld: 203 mg/dL — ABNORMAL HIGH (ref 70–99)
Glucose, Bld: 162 mg/dL — ABNORMAL HIGH (ref 70–99)

## 2013-06-11 LAB — TSH: TSH: 4.789 u[IU]/mL — ABNORMAL HIGH (ref 0.350–4.500)

## 2013-06-11 LAB — MAGNESIUM: Magnesium: 1.1 mg/dL — ABNORMAL LOW (ref 1.5–2.5)

## 2013-06-11 MED ORDER — POTASSIUM CHLORIDE 10 MEQ/100ML IV SOLN
10.0000 meq | INTRAVENOUS | Status: DC
  Administered 2013-06-11: 10 meq via INTRAVENOUS
  Filled 2013-06-11: qty 200

## 2013-06-11 MED ORDER — MAGNESIUM SULFATE 40 MG/ML IJ SOLN
2.0000 g | Freq: Once | INTRAMUSCULAR | Status: AC
  Administered 2013-06-11: 2 g via INTRAVENOUS
  Filled 2013-06-11: qty 50

## 2013-06-11 MED ORDER — POTASSIUM CHLORIDE CRYS ER 20 MEQ PO TBCR
40.0000 meq | EXTENDED_RELEASE_TABLET | ORAL | Status: AC
  Administered 2013-06-11 (×2): 40 meq via ORAL
  Filled 2013-06-11 (×2): qty 2

## 2013-06-11 MED ORDER — INSULIN ASPART 100 UNIT/ML ~~LOC~~ SOLN
4.0000 [IU] | Freq: Once | SUBCUTANEOUS | Status: AC
  Administered 2013-06-11: 4 [IU] via SUBCUTANEOUS

## 2013-06-11 MED ORDER — INSULIN ASPART 100 UNIT/ML ~~LOC~~ SOLN
0.0000 [IU] | Freq: Three times a day (TID) | SUBCUTANEOUS | Status: DC
  Administered 2013-06-11: 8 [IU] via SUBCUTANEOUS
  Administered 2013-06-11: 11 [IU] via SUBCUTANEOUS
  Administered 2013-06-12: 3 [IU] via SUBCUTANEOUS
  Administered 2013-06-12: 11 [IU] via SUBCUTANEOUS
  Administered 2013-06-12: 5 [IU] via SUBCUTANEOUS
  Administered 2013-06-13 (×3): 8 [IU] via SUBCUTANEOUS

## 2013-06-11 MED ORDER — INSULIN DETEMIR 100 UNIT/ML ~~LOC~~ SOLN
15.0000 [IU] | Freq: Every day | SUBCUTANEOUS | Status: DC
  Administered 2013-06-11: 15 [IU] via SUBCUTANEOUS
  Filled 2013-06-11: qty 0.15

## 2013-06-11 MED ORDER — POTASSIUM CHLORIDE 10 MEQ/100ML IV SOLN
10.0000 meq | INTRAVENOUS | Status: AC
  Administered 2013-06-11 (×4): 10 meq via INTRAVENOUS
  Filled 2013-06-11: qty 400

## 2013-06-11 MED ORDER — INSULIN DETEMIR 100 UNIT/ML ~~LOC~~ SOLN
12.0000 [IU] | Freq: Once | SUBCUTANEOUS | Status: AC
  Administered 2013-06-11: 12 [IU] via SUBCUTANEOUS
  Filled 2013-06-11: qty 0.12

## 2013-06-11 NOTE — Progress Notes (Signed)
[facility] PROGRESS NOTE  Wava Kildow UXL:[id number] DOB: November 07, 1956 DOA: 06/09/2013 PCP: Nelwyn Salisbury, MD  Assessment/Plan: 1-DKA (diabetic ketoacidoses): appears to be secondary to medication non-compliance.  -transfer to telemetry -IVF's, IV insulin per DKA protocol  -continue electrolytes repletion (potassium is 3.1) -A1C 13 -NPO status except for sips with meds and ice chips  -lipase is negative; CXR w/o signs of infection. Will cycle CE'z and will check UA  -will transition to SSI, advance diet to modified carb and use levemir. -Anion Gap is currently 8; patient CO2 21  2-Hx of DEPRESSION: continue home medications (effexor). Recently mother passed away and she is grieving from that, depression has worsening since. Intermittent thoughts about been dead and SI. -psychiatry consulted and recommending inpatient tx. -currently denying suicidal thoughts  -will continue effexor (hospital do not carry pristiq)  3-HYPERTENSION: currently stable. Will monitor and start tx if needed, at home patient was just following heart healthy diet.   4-ASTHMA: no wheezing. Increase Respiratory Rate resolved. (was due to metabolic acidosis).  -Will continue home maintenance inhalers and PRN nebulizer.  -Will continue singulair   5-GERD: continue PPI   6-Sleep apnea: continue CPAP at bedtime. Patient encourage to use machine.  7-Hyponatremia: due to hyperglycemia. Will monitor. Loney Hering much resolved with IVF's. Na level 134  8-Leukocytosis: due to demargination/stress response most likely. No signs of infection.  -WBC is trending down; no signs of infection. Will check CBC in am to follow trend  9-Suicidal thoughts: no active currently after discussing with patient. She expressed losing her mother recently and not having good grief support.  -per Psych recommendation will need inpatient psychiatry treatment -cousin is also reporting that at discharge she is physically not able to take  care of her anymore and would like assistance with placement (ALF)  10-Hypokalemia: will replete. Mg and phosphorus  WNL.  DVT: heparin.  Code Status: Full Family Communication: no family at bedside Disposition Plan: will continue treatment in stepdown.   Consultants:  Psychiatry   Procedures:  See below for x-ray reports  Antibiotics:  none  HPI/Subjective: Patient w/o suicidal thoughts currently; afebrile. Feeling better overall. Denies CP, SOB or any other complaints. Anion Gap is 8 and CO2 21  Objective: Filed Vitals:   06/11/13 0200 06/11/13 0400 06/11/13 0600 06/11/13 0800  BP: 107/67 110/73 112/77   Pulse: 82 78 74   Temp:  97.7 F (36.5 C)  97.6 F (36.4 C)  TempSrc:  Oral  Oral  Resp: 16 16 17    Height:   5\' 7"  (1.702 m)   Weight:   63.4 kg (139 lb 12.4 oz)   SpO2: 99% 98% 99%     Intake/Output Summary (Last 24 hours) at 06/11/13 0851 Last data filed at 06/11/13 0600  Gross per 24 hour  Intake   3677 ml  Output   3550 ml  Net    127 ml   Filed Weights   06/09/13 1800 06/11/13 0600  Weight: 63.64 kg (140 lb 4.8 oz) 63.4 kg (139 lb 12.4 oz)    Exam:   General:  NAD, afebrile; no suicidal thoughts, cooperative with staff  Cardiovascular: S1 and S2, no rubs or gallops  Respiratory: CTA bilaterally  Abdomen: soft, NT, ND, positive BS.   Musculoskeletal: no edema or cyanosis  Data Reviewed: Basic Metabolic Panel:  Recent Labs Lab 06/09/13 1444 06/09/13 1916  06/10/13 1200 06/10/13 1602 06/10/13 2010 06/11/13 0012 06/11/13 0555  NA 129*  --   < > 131*  132* 132* 132* 134*  K 4.7  --   < > 3.6 3.0* 2.9* 3.0* 3.1*  CL 107  --   < > 105 104 103 103 105  CO2  --   --   < > 14* 17* 18* 18* 21  GLUCOSE 598*  --   < > 185* 150* 130* 203* 162*  BUN 21  --   < > 8 7 6  5* 4*  CREATININE 0.50  --   < > 0.28* 0.27* 0.29* 0.27* 0.26*  CALCIUM  --   --   < > 9.0 9.2 9.5 9.1 9.0  MG  --  1.3*  --   --   --   --   --  1.1*  PHOS  --  2.3  --    --   --   --   --   --   < > = values in this interval not displayed. Liver Function Tests:  Recent Labs Lab 06/09/13 1435  AST 13  ALT 17  ALKPHOS 172*  BILITOT 0.3  PROT 8.5*  ALBUMIN 3.9    Recent Labs Lab 06/09/13 1435  LIPASE 25   CBC:  Recent Labs Lab 06/09/13 1435 06/09/13 1444 06/10/13 0803  WBC 24.0*  --  14.6*  NEUTROABS 21.6*  --   --   HGB 15.4* 17.3* 11.9*  HCT 44.6 51.0* 34.1*  MCV 80.5  --  77.9*  PLT 299  --  131*   Cardiac Enzymes:  Recent Labs Lab 06/09/13 1916 06/09/13 2343 06/10/13 0803  TROPONINI <0.30 <0.30 <0.30   CBG:  Recent Labs Lab 06/10/13 2156 06/10/13 2306 06/11/13 0538 06/11/13 0634 06/11/13 0742  GLUCAP 120* 186* 169* 147* 145*    Recent Results (from the past 240 hour(s))  MRSA PCR SCREENING     Status: Abnormal   Collection Time    06/09/13  5:58 PM      Result Value Range Status   MRSA by PCR POSITIVE (*) NEGATIVE Final   Comment:            The GeneXpert MRSA Assay (FDA     approved for NASAL specimens     only), is one component of a     comprehensive MRSA colonization     surveillance program. It is not     intended to diagnose MRSA     infection nor to guide or     monitor treatment for     MRSA infections.     RESULT CALLED TO, READ BACK BY AND VERIFIED WITH:     Marjory Lies [id number] @ 1907 BY J SCOTTON     Studies: Dg Abd Acute W/chest  06/09/2013   *RADIOLOGY REPORT*  Clinical Data: Abdominal pain.  Nausea.  Diabetes.  ACUTE ABDOMEN SERIES (ABDOMEN 2 VIEW & CHEST 1 VIEW)  Comparison: 02/27/2013  Findings: Asymmetric sclerosis of the left posterolateral sixth rib.  The patient is rotated to the left on today's exam, resulting in reduced diagnostic sensitivity and specificity.  Low lung volumes are present, causing crowding of the pulmonary vasculature. Cardiac and mediastinal contours appear unremarkable.  Lateral decubitus view of the abdomen demonstrates no abnormal air- fluid level although the  stomach appears distended.  Formed stool noted in the ascending colon, descending colon, and sigmoid colon. The small bowel is relatively gasless.  IMPRESSION: 1.  Distended stomach with relatively gasless small bowel.  This appearance is nonspecific and may simply represent a normal variation, but  the proximal obstruction is not confidently excluded. 2.  Sclerotic left sixth posterior rib, probably due to a healing rib fracture.  If the patient has a history malignancy with predilection for bony spread, further workup by chest CT or bone scan might be warranted.   Original Report Authenticated By: Gaylyn Rong, M.D.    Scheduled Meds: . budesonide-formoterol  2 puff Inhalation BID  . Chlorhexidine Gluconate Cloth  6 each Topical Q0600  . fluticasone  2 spray Each Nare Daily  . heparin  5,000 Units Subcutaneous Q8H  . imipramine  50 mg Oral QHS  . insulin aspart  0-15 Units Subcutaneous TID WC  . insulin detemir  12 Units Subcutaneous Once  . insulin detemir  15 Units Subcutaneous QHS  . loratadine  10 mg Oral Daily  . montelukast  10 mg Oral QHS  . mupirocin ointment  1 application Nasal BID  . oxybutynin  10 mg Oral Daily  . pantoprazole  40 mg Oral BID  . potassium chloride  40 mEq Oral Q4H  . rOPINIRole  1 mg Oral QHS  . venlafaxine XR  75 mg Oral Q breakfast   Continuous Infusions: . sodium chloride 75 mL (06/11/13 0850)  . insulin (NOVOLIN-R) infusion 2.6 Units/hr (06/11/13 0600)    Principal Problem:   DKA (diabetic ketoacidoses) Active Problems:   DEPRESSION   HYPERTENSION   ASTHMA   GERD   Sleep apnea   Hyponatremia   Leukocytosis    Time spent: > 30 minutes    Gussie Murton  [facility] Pager [phone]. If 7PM-7AM, please contact night-coverage at [email or web address], password [facility] 06/11/2013, 8:51 AM  LOS: 2 days

## 2013-06-11 NOTE — Telephone Encounter (Signed)
I did not see cousin's name listed to give information too. Pt must call and ask any questions.

## 2013-06-11 NOTE — Progress Notes (Signed)
Patient had some redness and itchiness on arms where plastic tape around IV was placed. Patient stated, "I am allergic to some kinds of tape." Plastic tape removed and replaced with paper tape. Will continue to monitor. Leeroy Cha

## 2013-06-11 NOTE — Telephone Encounter (Signed)
Attempted to reach cousin back, but was unable to due to a disconnected number.

## 2013-06-11 NOTE — Progress Notes (Signed)
Inpatient Diabetes Program Recommendations  AACE/ADA: New Consensus Statement on Inpatient Glycemic Control (2013)  Target Ranges:  Prepandial:   less than 140 mg/dL      Peak postprandial:   less than 180 mg/dL (1-2 hours)      Critically ill patients:  140 - 180 mg/dL   Reason for Assessment: Admitted with DKA.  Note: Patient has significant psyhco-social issues.  See psychiatry note.  Patient off GlucoStabilizer.  Received Lememir at transition and will get more at HS.  Per patient's Hgb A1C, patient has not been taking insulin regularly.  Question if patient will take 4 to 5 injections as part of a Levemir/Novolog regimen when not regularly taking 70/30 twice daily.  MD may want to consider putting back on 70/30.  Thank you.  Sayward Horvath S. Elsie Lincoln, RN, CNS, CDE Inpatient Diabetes Program, team pager [phone]

## 2013-06-11 NOTE — Progress Notes (Signed)
Received patient form Stepdown, alert and oriented, no complaints of any pain or discomfort. Agreed on previous Rn's assessment.
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
## 2013-06-11 NOTE — Progress Notes (Signed)
Nutrition Brief Note  Patient identified on the Malnutrition Screening Tool (MST) Report  Body mass index is 21.89 kg/(m^2). Patient meets criteria for normal weight based on current BMI.   Lab Results  Component Value Date   HGBA1C 13.0* 06/09/2013    Current diet order is CHO medium, patient is consuming approximately 100% of meals at this time. Labs and medications reviewed. Pt with low sodium and potassium, elevated blood sugar and Alk phos. Pt reported getting her meals from the soup kitchen PTA despite social worker note mentioning pt living with cousin. Pt reports poor appetite for 1 week with 5 pound unintended weight loss in the past 3 months. Pt had some nausea on admission however states this has resolved. Pt admits to not monitoring her diet for carbohydrates at home and states she has trouble counting well. Provided basic diabetic diet education and handouts of this information. Teach back method used. RD contact information provided. Expect fair compliance. Pt able to properly identify carbohydrates in diet. Will order outpatient DM follow up at [facility].   No nutrition interventions warranted at this time. If nutrition issues arise, please consult RD.   Levon Hedger MS, RD, LDN [phone] Pager [phone] After Hours Pager

## 2013-06-11 NOTE — Telephone Encounter (Signed)
Cousin called and stated that she would like to speak with Dr. [name] ASAP, regarding PT.

## 2013-06-12 DIAGNOSIS — E876 Hypokalemia: Secondary | ICD-10-CM | POA: Diagnosis not present

## 2013-06-12 DIAGNOSIS — E119 Type 2 diabetes mellitus without complications: Secondary | ICD-10-CM | POA: Diagnosis not present

## 2013-06-12 DIAGNOSIS — F329 Major depressive disorder, single episode, unspecified: Secondary | ICD-10-CM | POA: Diagnosis not present

## 2013-06-12 DIAGNOSIS — E1110 Type 2 diabetes mellitus with ketoacidosis without coma: Secondary | ICD-10-CM | POA: Diagnosis not present

## 2013-06-12 LAB — GLUCOSE, CAPILLARY
Glucose-Capillary: 153 mg/dL — ABNORMAL HIGH (ref 70–99)
Glucose-Capillary: 179 mg/dL — ABNORMAL HIGH (ref 70–99)
Glucose-Capillary: 187 mg/dL — ABNORMAL HIGH (ref 70–99)
Glucose-Capillary: 194 mg/dL — ABNORMAL HIGH (ref 70–99)
Glucose-Capillary: 235 mg/dL — ABNORMAL HIGH (ref 70–99)
Glucose-Capillary: 337 mg/dL — ABNORMAL HIGH (ref 70–99)

## 2013-06-12 LAB — CBC
Platelets: 112 10*3/uL — ABNORMAL LOW (ref 150–400)
RDW: 14.8 % (ref 11.5–15.5)
WBC: 5.6 10*3/uL (ref 4.0–10.5)

## 2013-06-12 LAB — BASIC METABOLIC PANEL
Calcium: 9.3 mg/dL (ref 8.4–10.5)
Chloride: 104 mEq/L (ref 96–112)
Creatinine, Ser: 0.28 mg/dL — ABNORMAL LOW (ref 0.50–1.10)
GFR calc Af Amer: >90 mL/min (ref >90)

## 2013-06-12 MED ORDER — INSULIN ASPART PROT & ASPART (70-30 MIX) 100 UNIT/ML ~~LOC~~ SUSP
15.0000 [IU] | Freq: Two times a day (BID) | SUBCUTANEOUS | Status: DC
  Administered 2013-06-12 – 2013-06-13 (×3): 15 [IU] via SUBCUTANEOUS
  Filled 2013-06-12: qty 10

## 2013-06-12 MED ORDER — INSULIN ASPART PROT & ASPART (70-30 MIX) 100 UNIT/ML ~~LOC~~ SUSP: 20.0000 [IU] | Freq: Two times a day (BID) | SUBCUTANEOUS | Status: DC

## 2013-06-12 MED ORDER — POTASSIUM CHLORIDE CRYS ER 20 MEQ PO TBCR
40.0000 meq | EXTENDED_RELEASE_TABLET | ORAL | Status: AC
  Administered 2013-06-12 – 2013-06-13 (×3): 40 meq via ORAL
  Filled 2013-06-12 (×3): qty 2

## 2013-06-12 NOTE — Progress Notes (Signed)
[facility] PROGRESS NOTE  Isabel [name] NFA:[id number] DOB: July 14, 1956 DOA: 06/09/2013 PCP: Nelwyn Salisbury, MD  Assessment/Plan: 1-DKA (diabetic ketoacidoses): appears to be secondary to medication non-compliance.  -transfer to telemetry -continue electrolytes repletion (potassium is 3.3) -A1C 13 -tolerating diet. -lipase is negative; CXR w/o signs of infection. Will cycle CE'z and will check UA  -will transition to 70/30 BID and adjust dose for better control of her CBG's. -resolved  2-Hx of DEPRESSION: continue home medications (effexor). Recently mother passed away and she is grieving from that, depression has worsening since. Intermittent thoughts about been dead and suicidal thoughts in the e recent past. -psychiatry consulted and recommending outpatient therapy now.  -currently denying suicidal thoughts  -will continue effexor (hospital do not carry pristiq)  3-HYPERTENSION: currently stable. Will monitor and start tx if needed, at home patient was just following heart healthy diet.   4-ASTHMA: no wheezing. Increase Respiratory Rate resolved. (was due to metabolic acidosis).  -Will continue home maintenance inhalers and PRN nebulizer.  -Will continue singulair   5-GERD: continue PPI   6-Sleep apnea: continue CPAP at bedtime. Patient encourage to use machine.  7-Hyponatremia: due to hyperglycemia. Will monitor. Pretty much resolved with IVF's. Na level 132 (corrected 134) -IVF's now changed to [facility] -patient advise to eat and drink properly  8-Leukocytosis: due to demargination/stress response most likely. No signs of infection.  -WBC is WNL.  9-Suicidal thoughts: no active currently after discussing with patient. She expressed losing her mother recently and not having good grief support.  -per Psych new recommendations ok to be discharge and follow outpatient treatment. -appointment set up by social worker. -cousin has agree for patient to return home until she  found a new place.  10-Hypokalemia and hypomagnasemia: will replete K and Mg; will check levels in am  11-thrombocytopenia: will follow platelets trend and will discontinue heparin.  DVT: SCD's.  Code Status: Full Family Communication: no family at bedside Disposition Plan: will discharge home when medically stable. 1-2 days.   Consultants:  Psychiatry   Procedures:  See below for x-ray reports  Antibiotics:  none  HPI/Subjective: Patient w/o suicidal thoughts currently; afebrile. Feeling better overall. Denies CP, SOB or any other complaints.   Objective: Filed Vitals:   06/12/13 0608 06/12/13 0846 06/12/13 1300 06/12/13 2012  BP: 109/76  121/89   Pulse: 87  91   Temp: 97.7 F (36.5 C)  98.1 F (36.7 C)   TempSrc: Oral  Oral   Resp: 16  18 18   Height:      Weight:      SpO2: 99% 100% 100%     Intake/Output Summary (Last 24 hours) at 06/12/13 2101 Last data filed at 06/12/13 1810  Gross per 24 hour  Intake   2625 ml  Output    400 ml  Net   2225 ml   Filed Weights   06/09/13 1800 06/11/13 0600  Weight: 63.64 kg (140 lb 4.8 oz) 63.4 kg (139 lb 12.4 oz)    Exam:   General:  NAD, afebrile; no suicidal thoughts, cooperative with staff  Cardiovascular: S1 and S2, no rubs or gallops  Respiratory: CTA bilaterally  Abdomen: soft, NT, ND, positive BS.   Musculoskeletal: no edema or cyanosis  Data Reviewed: Basic Metabolic Panel:  Recent Labs Lab 06/09/13 1444 06/09/13 1916  06/10/13 1602 06/10/13 2010 06/11/13 0012 06/11/13 0555 06/12/13 0515  NA 129*  --   < > 132* 132* 132* 134* 137  K 4.7  --   < >  3.0* 2.9* 3.0* 3.1* 3.3*  CL 107  --   < > 104 103 103 105 104  CO2  --   --   < > 17* 18* 18* 21 26  GLUCOSE 598*  --   < > 150* 130* 203* 162* 194*  BUN 21  --   < > 7 6 5* 4* 5*  CREATININE 0.50  --   < > 0.27* 0.29* 0.27* 0.26* 0.28*  CALCIUM  --   --   < > 9.2 9.5 9.1 9.0 9.3  MG  --  1.3*  --   --   --   --  1.1*  --   PHOS  --  2.3   --   --   --   --   --   --   < > = values in this interval not displayed. Liver Function Tests:  Recent Labs Lab 06/09/13 1435  AST 13  ALT 17  ALKPHOS 172*  BILITOT 0.3  PROT 8.5*  ALBUMIN 3.9    Recent Labs Lab 06/09/13 1435  LIPASE 25   CBC:  Recent Labs Lab 06/09/13 1435 06/09/13 1444 06/10/13 0803 06/12/13 0515  WBC 24.0*  --  14.6* 5.6  NEUTROABS 21.6*  --   --   --   HGB 15.4* 17.3* 11.9* 11.2*  HCT 44.6 51.0* 34.1* 31.8*  MCV 80.5  --  77.9* 77.0*  PLT 299  --  131* 112*   Cardiac Enzymes:  Recent Labs Lab 06/09/13 1916 06/09/13 2343 06/10/13 0803  TROPONINI <0.30 <0.30 <0.30   CBG:  Recent Labs Lab 06/11/13 2344 06/12/13 0401 06/12/13 0730 06/12/13 1151 06/12/13 1645  GLUCAP 188* 167* 213* 337* 194*    Recent Results (from the past 240 hour(s))  MRSA PCR SCREENING     Status: Abnormal   Collection Time    06/09/13  5:58 PM      Result Value Range Status   MRSA by PCR POSITIVE (*) NEGATIVE Final   Comment:            The GeneXpert MRSA Assay (FDA     approved for NASAL specimens     only), is one component of a     comprehensive MRSA colonization     surveillance program. It is not     intended to diagnose MRSA     infection nor to guide or     monitor treatment for     MRSA infections.     RESULT CALLED TO, READ BACK BY AND VERIFIED WITH:     Marjory Lies [id number] @ 1907 BY J SCOTTON     Studies: No results found.  Scheduled Meds: . budesonide-formoterol  2 puff Inhalation BID  . Chlorhexidine Gluconate Cloth  6 each Topical Q0600  . fluticasone  2 spray Each Nare Daily  . heparin  5,000 Units Subcutaneous Q8H  . imipramine  50 mg Oral QHS  . insulin aspart  0-15 Units Subcutaneous TID WC  . insulin aspart protamine- aspart  15 Units Subcutaneous BID WC  . loratadine  10 mg Oral Daily  . montelukast  10 mg Oral QHS  . mupirocin ointment  1 application Nasal BID  . oxybutynin  10 mg Oral Daily  . pantoprazole  40 mg  Oral BID  . rOPINIRole  1 mg Oral QHS  . venlafaxine XR  75 mg Oral Q breakfast   Continuous Infusions: . sodium chloride 75 mL/hr at 06/11/13 2307  Principal Problem:   DKA (diabetic ketoacidoses) Active Problems:   DEPRESSION   HYPERTENSION   ASTHMA   GERD   Sleep apnea   Hyponatremia   Leukocytosis    Time spent: > 30 minutes    Jebadiah Imperato  [facility] Pager [phone]. If 7PM-7AM, please contact night-coverage at [email or web address], password [facility] 06/12/2013, 9:01 PM  LOS: 3 days

## 2013-06-12 NOTE — Progress Notes (Signed)
Patient Identification:  Isabel [name] Date of Evaluation:  06/12/2013   History of Present Illness: This 57 year old Caucasian female who was seen by psychiatry consultation liaison services 2 days ago.  At that time patient was experiencing suicidal thoughts.  She is reporting depression and generalized weakness.  Patient was started on antidepressant.  She has been noncompliant with her psychotropic medication.  Her primary care physician is managing her medication.  She was seeing Dr. Cheree Ditto sandals until he retired.  Patient is feeling better today.  She is more hopeful and denies any active or passive suicidal thoughts or homicidal thoughts.  She denies any hallucination or any paranoia.  She's compliant with medication.  She devoted her mood has been improved as well as energy.  She is no longer having active suicidal thoughts or plan.  She wants to be discharged to the followup outpatient.  Patient does not report any side effects of medication.  Past Psychiatric History:  Please see previous notes.   Past Medical History:     Past Medical History  Diagnosis Date  . Allergy   . Asthma   . Depression   . GERD (gastroesophageal reflux disease)   . Hypertension   . Cystitis, interstitial   . Pneumonia   . Diabetes mellitus without complication   . Pancreatic pseudocyst   . Sleep apnea     wears a CPAP at night        Past Surgical History  Procedure Laterality Date  . Cystgastrostomy  09-02-12    per Dr. Cherly Hensen at [facility]   . Cholecystectomy      Allergies:  Allergies  Allergen Reactions  . Aspirin     REACTION: unspecified  . Latex     rash  . Penicillins     REACTION: unspecified  . Povidone     REACTION: unspecified  . Sulfamethoxazole     REACTION: unspecified    Current Medications:  Prior to Admission medications   Medication Sig Start Date End Date Taking? Authorizing Provider  albuterol (PROVENTIL HFA;VENTOLIN HFA) 108 (90 BASE) MCG/ACT inhaler Inhale  2 puffs into the lungs every 4 (four) hours as needed for wheezing or shortness of breath.   Yes Historical Provider, MD  budesonide-formoterol (SYMBICORT) 160-4.5 MCG/ACT inhaler Inhale 2 puffs into the lungs 2 (two) times daily.   Yes Historical Provider, MD  cetirizine (ZYRTEC) 10 MG tablet Take 1 tablet (10 mg total) by mouth daily. 12/21/12  Yes Nelwyn Salisbury, MD  desvenlafaxine (PRISTIQ) 50 MG 24 hr tablet Take 50 mg by mouth daily.   Yes Historical Provider, MD  esomeprazole (NEXIUM) 40 MG capsule Take 1 capsule (40 mg total) by mouth daily. 10/09/12  Yes Nelwyn Salisbury, MD  imipramine (TOFRANIL) 50 MG tablet Take 1 tablet (50 mg total) by mouth at bedtime. 10/09/12  Yes Nelwyn Salisbury, MD  insulin aspart protamine-insulin aspart (NOVOLOG 70/30) (70-30) 100 UNIT/ML injection Inject 20 Units into the skin daily.  10/10/12  Yes Nelwyn Salisbury, MD  mometasone (NASONEX) 50 MCG/ACT nasal spray Place 2 sprays into the nose daily. 12/21/12  Yes Nelwyn Salisbury, MD  montelukast (SINGULAIR) 10 MG tablet Take 1 tablet (10 mg total) by mouth at bedtime. 12/21/12  Yes Nelwyn Salisbury, MD  oxybutynin (DITROPAN-XL) 10 MG 24 hr tablet Take 10 mg by mouth daily.   Yes Historical Provider, MD  rOPINIRole (REQUIP) 1 MG tablet Take 1 mg by mouth at bedtime.  11/14/12  Yes  Nelwyn Salisbury, MD  zolpidem (AMBIEN) 10 MG tablet Take 10 mg by mouth at bedtime as needed for sleep.   Yes Historical Provider, MD  glucose blood (RELION PRIME TEST) test strip Use as instructed 05/11/13   Nelwyn Salisbury, MD    Social History:    reports that she has never smoked. She has never used smokeless tobacco. She reports that she does not drink alcohol or use illicit drugs.   Family History:    Family History  Problem Relation Age of Onset  . Cancer      other    Mental Status Examination/Evaluation:  Patient is elderly who appears to be in his stated age.  She maintained good eye contact.  Her speech is slow but clear and coherent.  She  described her mood is sad is constricted.  She denies any active or passive suicidal thoughts or homicidal thoughts.  There were no paranoia or delusion obsession present at this time.  Her attention and concentration is improved from the past.  Her psychomotor activity is slow but normal.  There were no tremors or shakes.  She's alert and oriented x3.  Her insight judgment and impulse control is okay.   DIAGNOSIS:   AXIS I   depressive disorder NOS   AXIS II  Deffered  AXIS III See medical notes.  AXIS IV problems with access to health care services  AXIS V 61-70 mild symptoms     Assessment/Plan: At this time patient does not meet criteria for psychiatric inpatient treatment.  She is no longer having suicidal thoughts.  She lost her followup outpatient with her primary care physician.  Patient can be discharged followup appointment and treatment option.

## 2013-06-12 NOTE — Progress Notes (Signed)
Clinical Social Work  CSW reviewed chart which stated that psych MD recommends outpatient follow up. CSW spoke with patient who desires to return to [facility]. Patient signed ROI and information faxed. Follow up appointment scheduled for 06/20/13 at 1 pm and placed on AVS. Patient aware of appointment.  CSW spoke with patient who had concerns about DC home. Per cousin, she does not want patient to return home. CSW called cousin and placed on speaker phone to talk with patient and cousin together. Cousin reports she has health issues and feels it is not safe for them to live together. Patient lives on disability check and reports she has already spent all of her money on rent this month. Patient reports her name is on lease as well. CSW spoke with patient and family about patient returning home until she could find an apartment. CSW provided patient with low income housing and ALF list. CSW explained that with apartment or ALF that patient would need income to afford housing. Patient is upset with CSW because she asked CSW to pick her up and take her to tour apartments. Patient also complains that she cannot pack her belongings and that CSW needs to assist with these matters. CSW explained that patient would be responsible for finding a place to live and it would be her responsibility to move. CSW offered patient work with community housing agencies but patient denied referral.  Patient was tearful during session and feels that CSW is not assisting her correctly. Per RN, cousin is worried about patient's safety and feels that patient is not safe to return home due to [facility]. CSW assessed patient for any SI and patient reports she is upset but denies any SI. Patient contracts for safety and can return home.   Patient reports she does not have transportation home. Patient usually uses [facility] in [place]. CSW called [facility] who is not agreeable to transport patient home from the hospital. Patient reports she will use [facility]  if she is unable to find a ride home.  CSW provided patient with CSW contact information and encouraged patient to call with any questions. CSW updated attending MD on patient's plans to return home with cousin while looking for housing. MD is also aware of outpatient follow up psych appointment.  [place], [place] [phone]

## 2013-06-12 NOTE — Progress Notes (Addendum)
Inpatient Diabetes Program Recommendations  AACE/ADA: New Consensus Statement on Inpatient Glycemic Control (2013)  Target Ranges:  Prepandial:   less than 140 mg/dL      Peak postprandial:   less than 180 mg/dL (1-2 hours)      Critically ill patients:  140 - 180 mg/dL   Reason for Visit: Patient admitted with DKA  Note:  Patient receptive to my visit.  Discussed/reviewed use of insulin pens.  Patient unaware of air shot and counting to 10 before removing from shin after injection.  Patient indicated understanding.  Has an AccuChek meter covered by Medicaid at home that is broken.  Also has a Sales promotion account executive.  Advised her to call the 1-800 number on AccuChek meter and see if they will send her a replacement for free (Medicaid will not purchase again since obtained recently).  If co-pay for [employer] meter is less than that on AccuChek meter, could use it.  If insurance payment better for AccuChek meter, should use it.  Discussed with patient importance of taking insulin without fail and eating at regular times-- three meals/day with small snacks in between.  Patient states that she has learned her lesson and will not try to go without her insulin anymore.  Thank you.  Zyann Mabry S. Elsie Lincoln, RN, CNS, CDE Inpatient Diabetes Program, team pager [phone]   Patient states that she needs more pen needles.  Request MD include a Rx for pen needles at discharge.  Thank you.  Mico Spark S. Elsie Lincoln, RN, CNS, CDE Inpatient Diabetes Program, team pager [phone]

## 2013-06-13 DIAGNOSIS — E1110 Type 2 diabetes mellitus with ketoacidosis without coma: Secondary | ICD-10-CM | POA: Diagnosis not present

## 2013-06-13 DIAGNOSIS — E669 Obesity, unspecified: Secondary | ICD-10-CM | POA: Diagnosis not present

## 2013-06-13 DIAGNOSIS — F329 Major depressive disorder, single episode, unspecified: Secondary | ICD-10-CM | POA: Diagnosis not present

## 2013-06-13 DIAGNOSIS — E118 Type 2 diabetes mellitus with unspecified complications: Secondary | ICD-10-CM | POA: Diagnosis not present

## 2013-06-13 DIAGNOSIS — E119 Type 2 diabetes mellitus without complications: Secondary | ICD-10-CM

## 2013-06-13 LAB — CBC
HCT: 32.2 % — ABNORMAL LOW (ref 36.0–46.0)
Platelets: 134 10*3/uL — ABNORMAL LOW (ref 150–400)
RBC: 4.11 MIL/uL (ref 3.87–5.11)
RDW: 14.8 % (ref 11.5–15.5)
WBC: 6.2 10*3/uL (ref 4.0–10.5)

## 2013-06-13 LAB — BASIC METABOLIC PANEL
CO2: 29 mEq/L (ref 19–32)
Chloride: 99 mEq/L (ref 96–112)
GFR calc Af Amer: >90 mL/min (ref >90)
Potassium: 4.2 mEq/L (ref 3.5–5.1)

## 2013-06-13 LAB — MAGNESIUM: Magnesium: 1.4 mg/dL — ABNORMAL LOW (ref 1.5–2.5)

## 2013-06-13 LAB — GLUCOSE, CAPILLARY
Glucose-Capillary: 270 mg/dL — ABNORMAL HIGH (ref 70–99)
Glucose-Capillary: 269 mg/dL — ABNORMAL HIGH (ref 70–99)

## 2013-06-13 MED ORDER — INSULIN ASPART PROT & ASPART (70-30 MIX) 100 UNIT/ML ~~LOC~~ SUSP: 18.0000 [IU] | Freq: Two times a day (BID) | SUBCUTANEOUS | Status: DC

## 2013-06-13 MED ORDER — MAGNESIUM SULFATE 40 MG/ML IJ SOLN
2.0000 g | Freq: Once | INTRAMUSCULAR | Status: AC
  Administered 2013-06-13: 2 g via INTRAVENOUS
  Filled 2013-06-13: qty 50

## 2013-06-13 MED ORDER — INSULIN ASPART PROT & ASPART (70-30 MIX) 100 UNIT/ML ~~LOC~~ SUSP: 20.0000 [IU] | Freq: Two times a day (BID) | SUBCUTANEOUS | Status: DC

## 2013-06-13 MED ORDER — INSULIN ASPART PROT & ASPART (70-30 MIX) 100 UNIT/ML ~~LOC~~ SUSP
18.0000 [IU] | Freq: Two times a day (BID) | SUBCUTANEOUS | Status: DC
  Administered 2013-06-13: 18 [IU] via SUBCUTANEOUS

## 2013-06-13 NOTE — Care Management Note (Signed)
    Page 1 of 1   06/13/2013     1:40:24 PM   CARE MANAGEMENT NOTE 06/13/2013  Patient:  Isabel [name], Isabel [name]   Account Number:  [id number]  Date Initiated:  06/10/2013  Documentation initiated by:  [facility]  Subjective/Objective Assessment:   57 year old female admitted with DKA.     Action/Plan:   From home.   Anticipated DC Date:  06/13/2013   Anticipated DC Plan:  HOME/SELF CARE      DC Planning Services  CM consult      Choice offered to / List presented to:             Status of service:  In process, will continue to follow Medicare Important Message given?   (If response is "NO", the following Medicare IM given date fields will be blank) Date Medicare IM given:   Date Additional Medicare IM given:    Discharge Disposition:    Per UR Regulation:  Reviewed for med. necessity/level of care/duration of stay  If discussed at Long Length of Stay Meetings, dates discussed:    Comments:  06/13/13 Jaymie Misch RN,BSN NCM [phone] PSYCH TO  REASSESS AGAIN?SUICIDAL THOUGHTS.D/C PLAN HOME.

## 2013-06-13 NOTE — Discharge Summary (Signed)
Physician Discharge Summary  Isabel [name] WGN:[id number] DOB: 26-Feb-1956 DOA: 06/09/2013  PCP: Nelwyn Salisbury, MD  Admit date: 06/09/2013 Discharge date: 06/13/2013  Time spent: 35 minutes  Recommendations for Outpatient Follow-up:  1. Needs to follow up with PCP for further adjustment of Diabetes medications.  2. Needs to follow up with Psychiatrist.   Discharge Diagnoses:    DKA (diabetic ketoacidoses)   DEPRESSION   HYPERTENSION   ASTHMA   GERD   Sleep apnea   Hyponatremia   Leukocytosis   Discharge Condition: Stable.   Diet recommendation: Carb Modified,   Filed Weights   06/09/13 1800 06/11/13 0600  Weight: 63.64 kg (140 lb 4.8 oz) 63.4 kg (139 lb 12.4 oz)    History of present illness:  Isabel [name] is a 57 y.o. female with PMH significant for HTN, DM type 2 on insulin; GERD, asthma, sleep apnea and depression; came to ED secondary to generalized weakness, Nausea and elevated blood sugar. Patient reports not feeling well in the last couple of days prior to admission and has not been compliant with insulin as she has not been eating much. On the day of admission she noticed she was breathing faster than usual, feeling really thirsty and with abd discomfort; CBG's at home too high to read; patient decide to come to ED. In ED was found with DKA. [facility] called to admit for further evaluation and treatment  Patient denies CP, SOB, cough, fever, chills, dysuria, hematemesis, melena, HA's, stiff neck symptoms or any other complaints.   Hospital Course:  1-DKA (diabetic ketoacidoses): appears to be secondary to medication non-compliance.  -transfer to telemetry  -continue electrolytes repletion (potassium is 3.3)  -A1C 13  -tolerating diet.  -lipase is negative; CXR w/o signs of infection. Will cycle CE'z and will check UA  -will change  70/30 BID to 18 units and adjust dose for better control of her CBG's.  -resolved   2-Hx of DEPRESSION: continue home medications  (effexor). Recently mother passed away and she is grieving from that, depression has worsening since. Intermittent thoughts about been dead and suicidal thoughts in the e recent past.  -psychiatry consulted and recommending outpatient therapy now.  -currently denying suicidal thoughts  -will continue effexor (hospital do not carry pristiq)   3-HYPERTENSION: currently stable. Will monitor and start tx if needed, at home patient was just following heart healthy diet.  4-ASTHMA: no wheezing. Increase Respiratory Rate resolved. (was due to metabolic acidosis).  -Will continue home maintenance inhalers and PRN nebulizer.  -Will continue singulair   5-GERD: continue PPI  6-Sleep apnea: continue CPAP at bedtime. Patient encourage to use machine.  7-Hyponatremia: due to hyperglycemia. resolved with IVF's. Na level 132 (corrected 134)  8-Leukocytosis: due to demargination/stress response most likely. No signs of infection.   9-Suicidal thoughts: no active currently after discussing with patient. She expressed losing her mother recently and not having good grief support.  -Appointment set up by social worker.  -Cousin report patient told her that she wants to end her life yesterday after patient was evaluated by Psych.  Patient denies to me suicidal thought at this time. I have ask psych to re evaluated. Patient to be discharge today if clear by psych.   10-Hypokalemia and hypomagnasemia: will replete K and Mg;  11-thrombocytopenia: will follow platelets trend and will discontinue heparin. Platelet increasing.    Procedures:  none  Consultations:  Psych  Discharge Exam: Filed Vitals:   06/12/13 2112 06/12/13 2236 06/13/13 0529 06/13/13  0900  BP: 108/73  107/70   Pulse: 79  73   Temp: 98.1 F (36.7 C)  98.1 F (36.7 C)   TempSrc: Oral  Oral   Resp: 16 18 16    Height:      Weight:      SpO2: 100%  99% 100%    General: No distress.  Cardiovascular: S 1, S 2 RRR Respiratory:  CTA  Discharge Instructions  Discharge Orders   Future Orders Complete By Expires     Amb Referral to Nutrition and Diabetic E  As directed         Medication List         albuterol 108 (90 BASE) MCG/ACT inhaler  Commonly known as:  PROVENTIL HFA;VENTOLIN HFA  Inhale 2 puffs into the lungs every 4 (four) hours as needed for wheezing or shortness of breath.     budesonide-formoterol 160-4.5 MCG/ACT inhaler  Commonly known as:  SYMBICORT  Inhale 2 puffs into the lungs 2 (two) times daily.     cetirizine 10 MG tablet  Commonly known as:  ZYRTEC  Take 1 tablet (10 mg total) by mouth daily.     desvenlafaxine 50 MG 24 hr tablet  Commonly known as:  PRISTIQ  Take 50 mg by mouth daily.     esomeprazole 40 MG capsule  Commonly known as:  NEXIUM  Take 1 capsule (40 mg total) by mouth daily.     glucose blood test strip  Commonly known as:  RELION PRIME TEST  Use as instructed     imipramine 50 MG tablet  Commonly known as:  TOFRANIL  Take 1 tablet (50 mg total) by mouth at bedtime.     insulin aspart protamine- aspart (70-30) 100 UNIT/ML injection  Commonly known as:  NOVOLOG MIX 70/30  Inject 0.18 mLs (18 Units total) into the skin 2 (two) times daily with a meal.     mometasone 50 MCG/ACT nasal spray  Commonly known as:  NASONEX  Place 2 sprays into the nose daily.     montelukast 10 MG tablet  Commonly known as:  SINGULAIR  Take 1 tablet (10 mg total) by mouth at bedtime.     nystatin cream  Commonly known as:  MYCOSTATIN  APPLY TO SKIN  TWICE DAILY  *CONTACT DOCTOR'S OFFICE FOR REFILLS*     oxybutynin 10 MG 24 hr tablet  Commonly known as:  DITROPAN-XL  Take 10 mg by mouth daily.     rOPINIRole 1 MG tablet  Commonly known as:  REQUIP  Take 1 mg by mouth at bedtime.     zolpidem 10 MG tablet  Commonly known as:  AMBIEN  Take 10 mg by mouth at bedtime as needed for sleep.       Allergies  Allergen Reactions  . Aspirin     REACTION: unspecified  .  Latex     rash  . Penicillins     REACTION: unspecified  . Povidone     REACTION: unspecified  . Sulfamethoxazole     REACTION: unspecified       Follow-up Information   Call [facility] . (Appointment scheduled for 06/20/13 at 1pm)    Contact information:   [address] [place], [place] [phone]       The results of significant diagnostics from this hospitalization (including imaging, microbiology, ancillary and laboratory) are listed below for reference.    Significant Diagnostic Studies: Dg Abd Acute W/chest  06/09/2013   *RADIOLOGY REPORT*  Clinical Data: Abdominal pain.  Nausea.  Diabetes.  ACUTE ABDOMEN SERIES (ABDOMEN 2 VIEW & CHEST 1 VIEW)  Comparison: 02/27/2013  Findings: Asymmetric sclerosis of the left posterolateral sixth rib.  The patient is rotated to the left on today's exam, resulting in reduced diagnostic sensitivity and specificity.  Low lung volumes are present, causing crowding of the pulmonary vasculature. Cardiac and mediastinal contours appear unremarkable.  Lateral decubitus view of the abdomen demonstrates no abnormal air- fluid level although the stomach appears distended.  Formed stool noted in the ascending colon, descending colon, and sigmoid colon. The small bowel is relatively gasless.  IMPRESSION: 1.  Distended stomach with relatively gasless small bowel.  This appearance is nonspecific and may simply represent a normal variation, but the proximal obstruction is not confidently excluded. 2.  Sclerotic left sixth posterior rib, probably due to a healing rib fracture.  If the patient has a history malignancy with predilection for bony spread, further workup by chest CT or bone scan might be warranted.   Original Report Authenticated By: Gaylyn Rong, M.D.    Microbiology: Recent Results (from the past 240 hour(s))  MRSA PCR SCREENING     Status: Abnormal   Collection Time    06/09/13  5:58 PM      Result Value Range Status   MRSA by PCR POSITIVE  (*) NEGATIVE Final   Comment:            The GeneXpert MRSA Assay (FDA     approved for NASAL specimens     only), is one component of a     comprehensive MRSA colonization     surveillance program. It is not     intended to diagnose MRSA     infection nor to guide or     monitor treatment for     MRSA infections.     RESULT CALLED TO, READ BACK BY AND VERIFIED WITH:     Marjory Lies [id number] @ 1907 BY J SCOTTON     Labs: Basic Metabolic Panel:  Recent Labs Lab 06/09/13 1444 06/09/13 1916  06/10/13 2010 06/11/13 0012 06/11/13 0555 06/12/13 0515 06/13/13 0507  NA 129*  --   < > 132* 132* 134* 137 134*  K 4.7  --   < > 2.9* 3.0* 3.1* 3.3* 4.2  CL 107  --   < > 103 103 105 104 99  CO2  --   --   < > 18* 18* 21 26 29   GLUCOSE 598*  --   < > 130* 203* 162* 194* 271*  BUN 21  --   < > 6 5* 4* 5* 8  CREATININE 0.50  --   < > 0.29* 0.27* 0.26* 0.28* 0.30*  CALCIUM  --   --   < > 9.5 9.1 9.0 9.3 9.8  MG  --  1.3*  --   --   --  1.1*  --  1.4*  PHOS  --  2.3  --   --   --   --   --   --   < > = values in this interval not displayed. Liver Function Tests:  Recent Labs Lab 06/09/13 1435  AST 13  ALT 17  ALKPHOS 172*  BILITOT 0.3  PROT 8.5*  ALBUMIN 3.9    Recent Labs Lab 06/09/13 1435  LIPASE 25   No results found for this basename: AMMONIA,  in the last 168 hours CBC:  Recent Labs Lab 06/09/13 1435 06/09/13  1444 06/10/13 0803 06/12/13 0515 06/13/13 0507  WBC 24.0*  --  14.6* 5.6 6.2  NEUTROABS 21.6*  --   --   --   --   HGB 15.4* 17.3* 11.9* 11.2* 11.1*  HCT 44.6 51.0* 34.1* 31.8* 32.2*  MCV 80.5  --  77.9* 77.0* 78.3  PLT 299  --  131* 112* 134*   Cardiac Enzymes:  Recent Labs Lab 06/09/13 1916 06/09/13 2343 06/10/13 0803  TROPONINI <0.30 <0.30 <0.30   BNP: BNP (last 3 results) No results found for this basename: PROBNP,  in the last 8760 hours CBG:  Recent Labs Lab 06/12/13 1151 06/12/13 1645 06/12/13 2111 06/13/13 0739  06/13/13 1124  GLUCAP 337* 194* 235* 270* 269*       Signed:  Eniya Cannady  [facility] 06/13/2013, 12:53 PM

## 2013-06-13 NOTE — Telephone Encounter (Signed)
Call in 30 grams with 11 rf

## 2013-06-13 NOTE — Progress Notes (Signed)
Patient Identification:  Isabel [name] Date of Evaluation:  06/13/2013   History of Present Illness: Patient is 57 year old Caucasian female who was seen yesterday.  Patient did not make criteria for inpatient psychiatric services.  Recommendation was made for outpatient services.  Consult was called again as patient expresses thoughts.  Patient seen today.  Patient admitted feeling sad after talking to the social worker yesterday.  She was talking about her physical limitations and financial needs and that caused her more sad and depressed.  However today patient is feeling much better.  She denies any suicidal.  She sleeping better.  She was to followup outpatient .  She denies any paranoia or hallucination or any crying spells.  Her behavior was calm.  She denies any active or passive suicidal thoughts .  She denies any crying spells or any severe anxiety  Past Psychiatric History: See previous note   Past Medical History:     Past Medical History  Diagnosis Date  . Allergy   . Asthma   . Depression   . GERD (gastroesophageal reflux disease)   . Hypertension   . Cystitis, interstitial   . Pneumonia   . Diabetes mellitus without complication   . Pancreatic pseudocyst   . Sleep apnea     wears a CPAP at night        Past Surgical History  Procedure Laterality Date  . Cystgastrostomy  09-02-12    per Dr. Cherly Hensen at [facility]   . Cholecystectomy      Allergies:  Allergies  Allergen Reactions  . Aspirin     REACTION: unspecified  . Latex     rash  . Penicillins     REACTION: unspecified  . Povidone     REACTION: unspecified  . Sulfamethoxazole     REACTION: unspecified    Current Medications:  Prior to Admission medications   Medication Sig Start Date End Date Taking? Authorizing Provider  albuterol (PROVENTIL HFA;VENTOLIN HFA) 108 (90 BASE) MCG/ACT inhaler Inhale 2 puffs into the lungs every 4 (four) hours as needed for wheezing or shortness of breath.   Yes Historical  Provider, MD  budesonide-formoterol (SYMBICORT) 160-4.5 MCG/ACT inhaler Inhale 2 puffs into the lungs 2 (two) times daily.   Yes Historical Provider, MD  cetirizine (ZYRTEC) 10 MG tablet Take 1 tablet (10 mg total) by mouth daily. 12/21/12  Yes Nelwyn Salisbury, MD  desvenlafaxine (PRISTIQ) 50 MG 24 hr tablet Take 50 mg by mouth daily.   Yes Historical Provider, MD  esomeprazole (NEXIUM) 40 MG capsule Take 1 capsule (40 mg total) by mouth daily. 10/09/12  Yes Nelwyn Salisbury, MD  imipramine (TOFRANIL) 50 MG tablet Take 1 tablet (50 mg total) by mouth at bedtime. 10/09/12  Yes Nelwyn Salisbury, MD  mometasone (NASONEX) 50 MCG/ACT nasal spray Place 2 sprays into the nose daily. 12/21/12  Yes Nelwyn Salisbury, MD  montelukast (SINGULAIR) 10 MG tablet Take 1 tablet (10 mg total) by mouth at bedtime. 12/21/12  Yes Nelwyn Salisbury, MD  oxybutynin (DITROPAN-XL) 10 MG 24 hr tablet Take 10 mg by mouth daily.   Yes Historical Provider, MD  rOPINIRole (REQUIP) 1 MG tablet Take 1 mg by mouth at bedtime.  11/14/12  Yes Nelwyn Salisbury, MD  zolpidem (AMBIEN) 10 MG tablet Take 10 mg by mouth at bedtime as needed for sleep.   Yes Historical Provider, MD  glucose blood (RELION PRIME TEST) test strip Use as instructed 05/11/13   Jeannett Senior  Marguerita Beards, MD  insulin aspart protamine- aspart (NOVOLOG MIX 70/30) (70-30) 100 UNIT/ML injection Inject 0.18 mLs (18 Units total) into the skin 2 (two) times daily with a meal. 06/13/13   Belkys A Regalado, MD  nystatin cream (MYCOSTATIN) APPLY TO SKIN  TWICE DAILY  *CONTACT DOCTOR'S OFFICE FOR REFILLS* 06/07/13   Nelwyn Salisbury, MD    Social History:    reports that she has never smoked. She has never used smokeless tobacco. She reports that she does not drink alcohol or use illicit drugs.   Family History:    Family History  Problem Relation Age of Onset  . Cancer      other    Mental Status Examination/Evaluation: Patient is pleasant cooperative female who appears to be in his stated age.  Her speech  is soft clear and coherent thought process logical and goal-directed.  There were no paranoia or delusion obsession present at this time.  She denies any auditory or visual hallucination.  Her psychomotor activity is normal.  She's alert and oriented x3.  She described her mood is anxious and her affect is constricted she denies any active or passive suicidal thoughts or homicidal thoughts.  Her insight judgment and impulse control is okay.   DIAGNOSIS:   AXIS I   depressive disorder NOS   AXIS II  Deffered  AXIS III See medical notes.  AXIS IV economic problems and other psychosocial or environmental problems  AXIS V 61-70 mild symptoms     Assessment/Plan: Patient does not have any suicidal thinking.  She does not meet criteria for inpatient psychiatric services Recommend outpatient followup.  Recommend to arrange followup appointment for medication management and counseling.

## 2013-06-13 NOTE — Progress Notes (Signed)
Clinical Social Work  CSW received a call from [employer] reporting concern for patient's safety. RN reports that patient has been appropriate today but had reports from evening shift RN that patient stated she was suicidal yesterday.   CSW met with patient at bedside. Patient sitting in chair and watching TV. Patient reports she is feeling better and anxious to DC back home.  Patient stated that she wanted to apologize to CSW for yelling yesterday. Patient reports she was upset after cousin asked her to find somewhere else to live. Patient reports she often has trouble managing her anger and was sorry that she yelled when she was upset yesterday. CSW validated patient feelings and encouraged patient to discuss this in therapy in order to learn coping skills.  Patient reports she is not feeling depressed today. Patient reports that she was overwhelmed yesterday but denies any SI. Patient reports that now that she feels she is medically getting better and has a plan for the future, she feels better. Patient reports no safety concerns with returning home and contracts for safety.  CSW and patient spent time talking about coping mechanisms that patient can use in the hospital. CSW encouraged patient to call CSW if she needed additional support. CSW encouraged patient to continue to read and to complete puzzles in order to "keep her mind busy." CSW and patient discussed healthy boundaries between patient and her cousin for when she returns home.  Patient was engaged during assessment with appropriate affect. Patient is alert and oriented and engaged in her treatment. Patient had good eye contact and used problem-solving skills when discussing making plans when she returns home.  RN reports that MD requested psych MD evaluate patient again. CSW will continue to follow to assist with if there are any new recommendations.  [place], [place] [phone]

## 2013-07-11 DIAGNOSIS — Z006 Encounter for examination for normal comparison and control in clinical research program: Secondary | ICD-10-CM | POA: Diagnosis not present

## 2013-07-11 DIAGNOSIS — J3089 Other allergic rhinitis: Secondary | ICD-10-CM | POA: Diagnosis not present

## 2013-07-11 DIAGNOSIS — G4710 Hypersomnia, unspecified: Secondary | ICD-10-CM | POA: Diagnosis not present

## 2013-07-11 DIAGNOSIS — R5383 Other fatigue: Secondary | ICD-10-CM | POA: Diagnosis not present

## 2013-07-11 DIAGNOSIS — R0989 Other specified symptoms and signs involving the circulatory and respiratory systems: Secondary | ICD-10-CM | POA: Diagnosis not present

## 2013-07-11 DIAGNOSIS — R0609 Other forms of dyspnea: Secondary | ICD-10-CM | POA: Diagnosis not present

## 2013-07-11 DIAGNOSIS — E559 Vitamin D deficiency, unspecified: Secondary | ICD-10-CM | POA: Diagnosis not present

## 2013-07-11 DIAGNOSIS — R5381 Other malaise: Secondary | ICD-10-CM | POA: Diagnosis not present

## 2013-07-11 DIAGNOSIS — K219 Gastro-esophageal reflux disease without esophagitis: Secondary | ICD-10-CM | POA: Diagnosis not present

## 2013-07-12 ENCOUNTER — Other Ambulatory Visit: Payer: Self-pay | Admitting: Family Medicine

## 2013-07-20 DIAGNOSIS — J3089 Other allergic rhinitis: Secondary | ICD-10-CM | POA: Diagnosis not present

## 2013-08-07 ENCOUNTER — Ambulatory Visit: Payer: Medicare Other | Admitting: *Deleted

## 2013-08-14 DIAGNOSIS — R0609 Other forms of dyspnea: Secondary | ICD-10-CM | POA: Diagnosis not present

## 2013-08-14 DIAGNOSIS — J3089 Other allergic rhinitis: Secondary | ICD-10-CM | POA: Diagnosis not present

## 2013-08-14 DIAGNOSIS — G4710 Hypersomnia, unspecified: Secondary | ICD-10-CM | POA: Diagnosis not present

## 2013-08-14 DIAGNOSIS — R5381 Other malaise: Secondary | ICD-10-CM | POA: Diagnosis not present

## 2013-08-14 DIAGNOSIS — K219 Gastro-esophageal reflux disease without esophagitis: Secondary | ICD-10-CM | POA: Diagnosis not present

## 2013-08-22 IMAGING — CT CT ABD-PELV W/ CM
1 of 3 series · 13 of 32 positions shown, 18 images · IV contrast (OMNIPAQUE 300)
Comparison: 08/24/2012

CLINICAL DATA: Abdominal mass, wound  dehiscence

CT ABDOMEN AND PELVIS WITH CONTRAST
TECHNIQUE: Multidetector CT imaging of the abdomen and pelvis was
performed following the standard protocol during bolus
administration of intravenous contrast.
Contrast: 100mL OMNIPAQUE IOHEXOL 300 MG/ML  SOLN, 50mL OMNIPAQUE
IOHEXOL 300 MG/ML  SOLN

[Series 2: abd/pel with · axial · 0.74mm/px · z∈[+1204,+1579]mm · 13 of 85 slices shown, 18 images]
[im 5/85  soft-tissue]
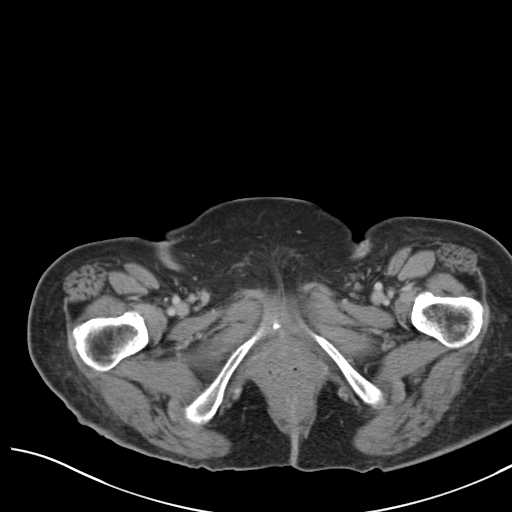
[im 5/85  bone]
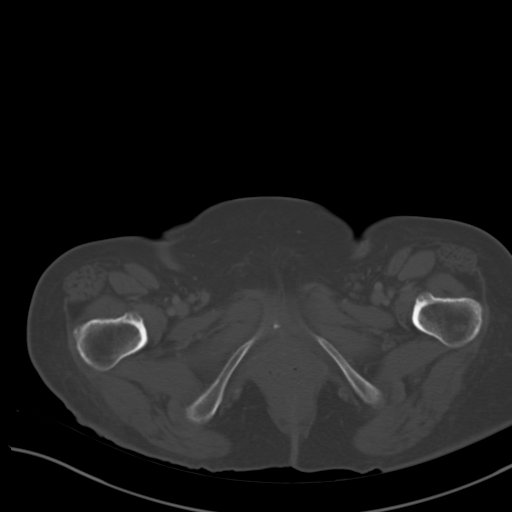
[im 13/85  soft-tissue]
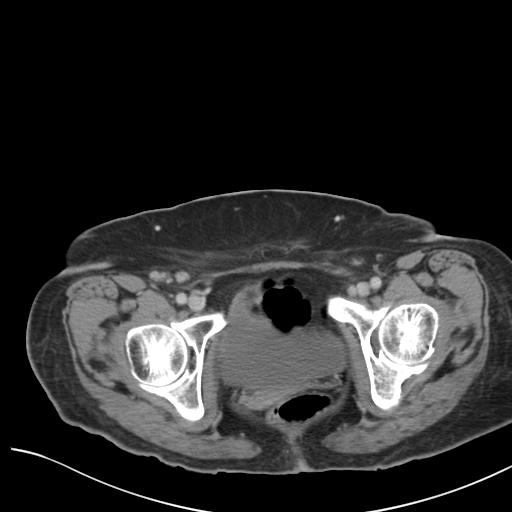
[im 17/85  soft-tissue]
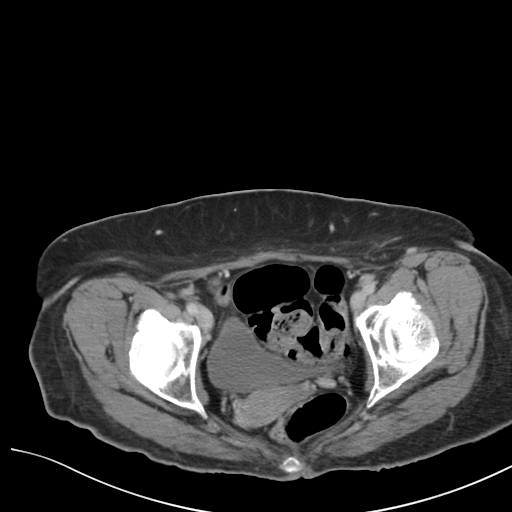
[im 26/85  soft-tissue]
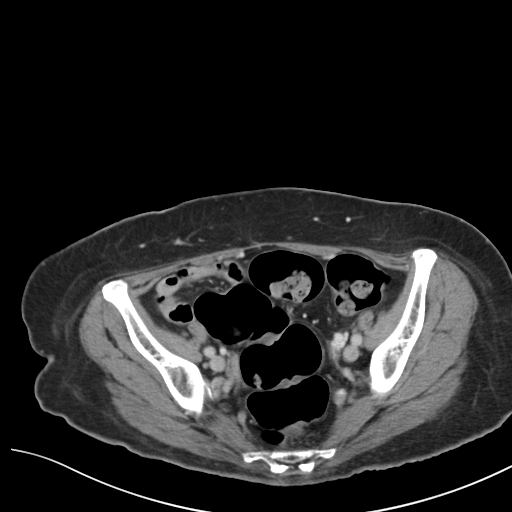
[im 34/85  soft-tissue]
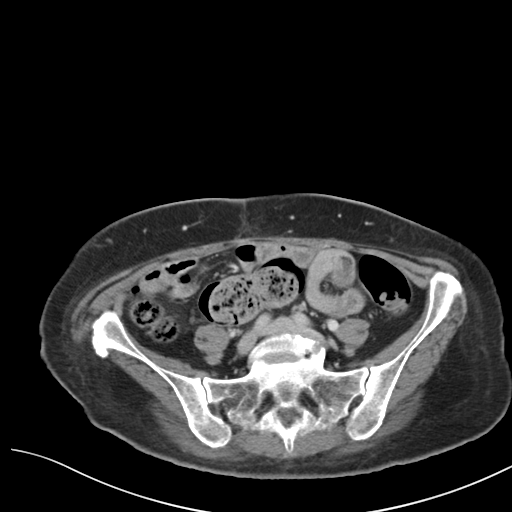
[im 38/85  soft-tissue]
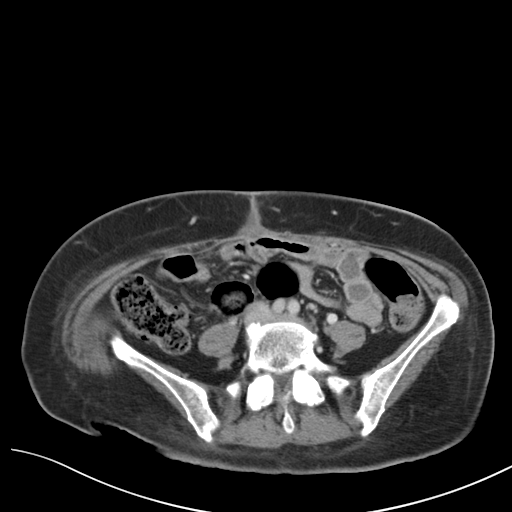
[im 47/85  soft-tissue]
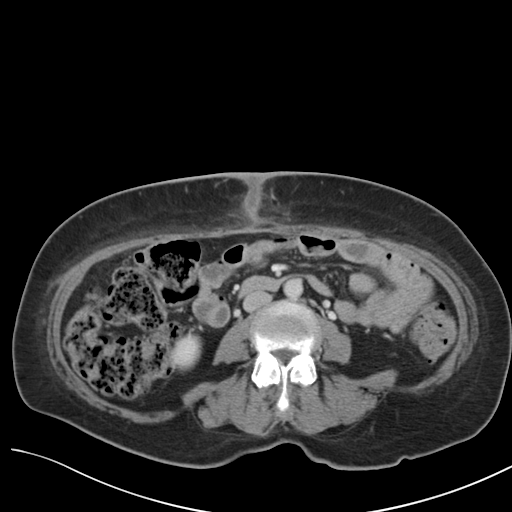
[im 51/85  soft-tissue]
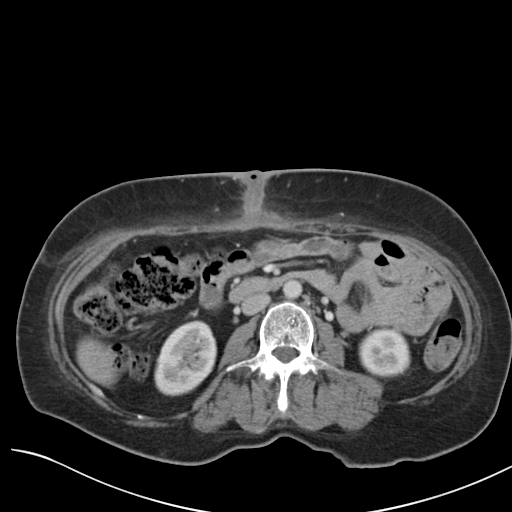
[im 59/85  soft-tissue]
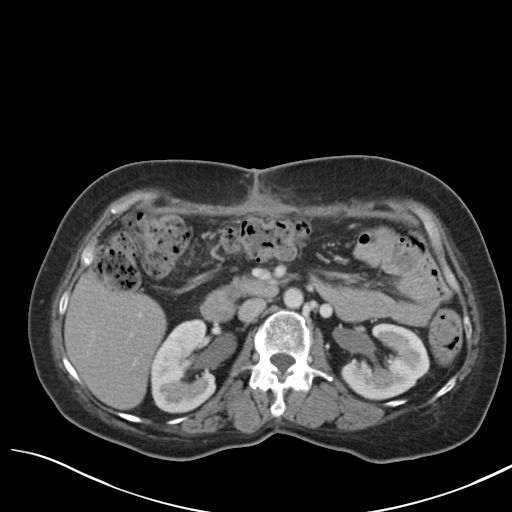
[im 59/85  bone]
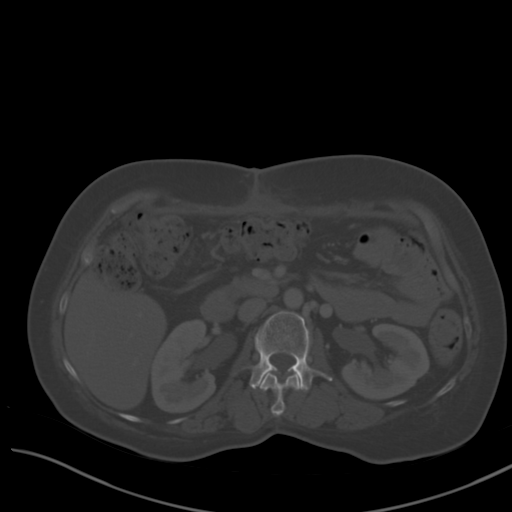
[im 68/85  soft-tissue]
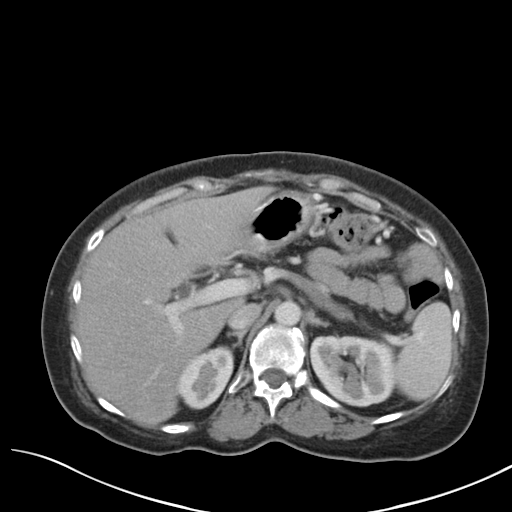
[im 68/85  lung]
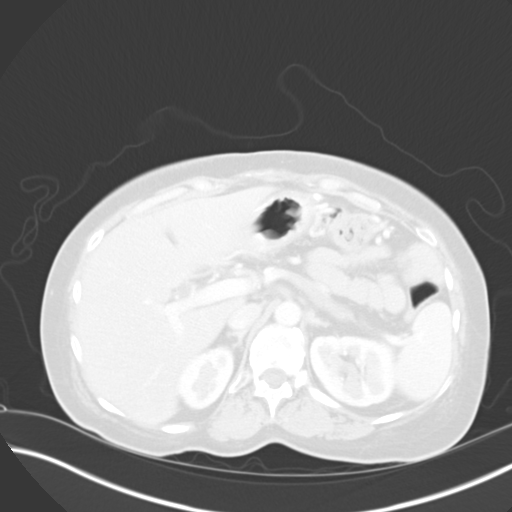
[im 72/85  soft-tissue]
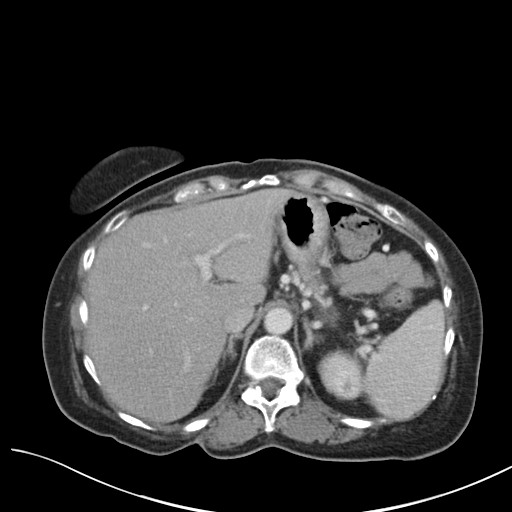
[im 72/85  lung]
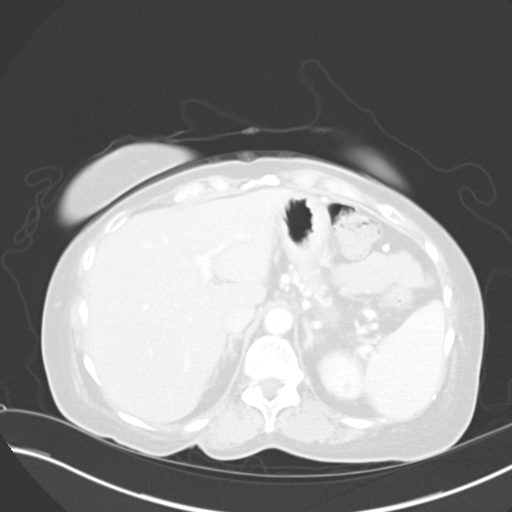
[im 76/85  lung]
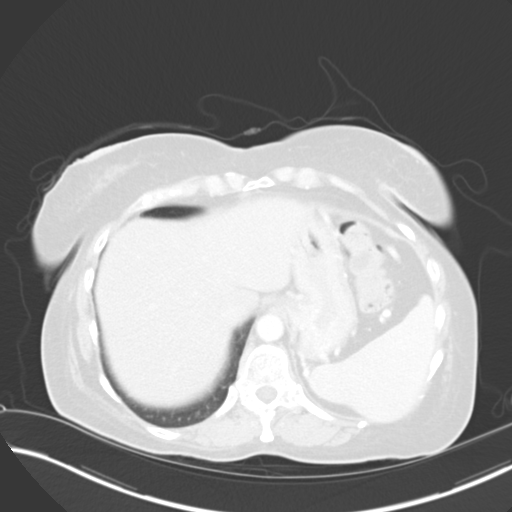
[im 80/85  soft-tissue]
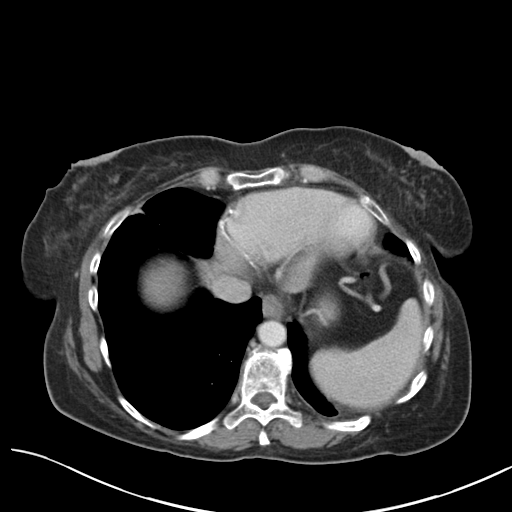
[im 80/85  lung]
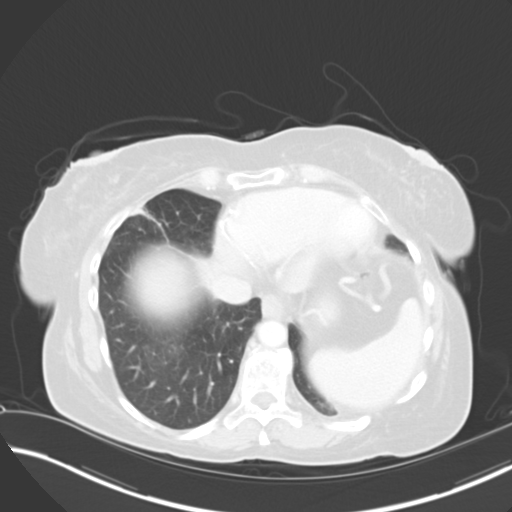

[13 of 32 positions shown; findings below may reference images not displayed]

FINDINGS: Lung bases are unremarkable.  Sagittal images of the
spine shows mild degenerative changes thoracolumbar spine.
Postsurgical changes post cholecystectomy are noted in the right
upper abdomen.  There is a midline lower abdominal wall  scar.

Liver is unremarkable.  The pancreas is not identified.  The spleen
and adrenal glands are unremarkable.  Kidneys are symmetrical in
size and enhancement.  No small bowel obstruction.  No ascites or
free air.  No adenopathy.

Delayed renal images shows bilateral renal symmetrical excretion.
Bilateral visualized proximal ureter is unremarkable.  No
hydronephrosis or hydroureter.

In axial image there is mild thickening of the midline abdominal
fascia.  There is a small amount of fluid above the fascia in
anterior abdominal wall.  The fluid is extending cranial caudally
anterior to the fascia midline about 3 cm.  There is mild stranding
of the surrounding fat.  Findings are highly suspicious for
postsurgical edema or infection/inflammation.  In axial image 35
there is a fluid collection along the abdominal scar.  This fluid
collection demonstrates some peripheral enhancement and small
amount of air.  This is best visualized in sagittal image 56
measures 5.6 cm cranial caudally by 2.8 cm AP diameter.  This is
highly suspicious for infected fluid/abscess along the abdominal
scar.  Clinical correlation is necessary.  Mild adjacent skin
thickening.

In axial image 56 there is small amount of fluid in the umbilical
region measures 2 x 1.2 cm.

The uterus and adnexa are unremarkable.  The urinary bladder is
unremarkable.  There is no pericecal inflammation.
IMPRESSION: 1.  No acute intra-abdominal process.  No small bowel obstruction.
2.  No hydronephrosis or hydroureter.
3.  In axial image there is mild thickening of the midline
abdominal fascia.  There is a small amount of fluid above the
fascia in anterior abdominal wall.  The fluid is extending cranial
caudally anterior to the fascia midline about 3 cm.  There is mild
stranding of the surrounding fat.  Findings are highly suspicious
for postsurgical edema or infection/inflammation.  In axial image
35 there is a fluid collection along the abdominal scar.  This
fluid collection demonstrates some peripheral enhancement and small
amount of air.  This is best visualized in sagittal image 56
measures 5.6 cm cranial caudally by 2.8 cm AP diameter.  This is
highly suspicious for infected fluid/abscess along the abdominal
scar.  Clinical correlation is necessary.

## 2013-09-11 ENCOUNTER — Ambulatory Visit: Payer: Medicare Other | Admitting: *Deleted

## 2013-09-14 ENCOUNTER — Encounter: Payer: Medicare Other | Attending: Family Medicine | Admitting: *Deleted

## 2013-09-14 DIAGNOSIS — E119 Type 2 diabetes mellitus without complications: Secondary | ICD-10-CM | POA: Diagnosis not present

## 2013-09-14 DIAGNOSIS — Z713 Dietary counseling and surveillance: Secondary | ICD-10-CM | POA: Diagnosis not present

## 2013-09-14 DIAGNOSIS — E669 Obesity, unspecified: Secondary | ICD-10-CM | POA: Diagnosis not present

## 2013-09-14 DIAGNOSIS — E1169 Type 2 diabetes mellitus with other specified complication: Secondary | ICD-10-CM

## 2013-09-14 NOTE — Progress Notes (Signed)
Appt start time: 0830 end time:  0900.  Assessment:  Patient was seen on  09/14/13 for individual diabetes education. Isabel [name] was referred due to recent diagnosis of DKA.  She has had diabetes for 3 years, but it's been poorly managed.  She has very limited resources and is not in a place whe Isabel [name] lives with her cousin.  She is dependent on her cousin.  She has financial concerns and is food insecure.  Isabel [name] states that this cousin doesn't want her to take her insulin properly.  Isabel [name] states that this is why she ended up in the hospital.  Isabel [name] does not have her own transportation so sometimes she isn't able to pick up her insulin on time and misses doses.  Current HbA1c: 13%  MEDICATIONS: see list   DIETARY INTAKE:  Usual eating pattern includes 1-2 meals and 0-2 snacks per day.  24-hr recall:  B ( AM): skips  Snk ( AM): none  L ( PM): eats at soup kitchen Snk ( PM): only when she can afford it D ( PM): ground [place] hamburger helper.  Eggs and grits.  Sometimes goes to soup kitchen Snk ( PM): sometimes bagel or yogurt or ice cream or cereal Beverages: water, coffee. Sometimes milk.  Sometimes soda, but not often  Usual physical activity: sometimes walks to [employer] energy needs: 1500 calories 170 g carbohydrates 112 g protein 42 g fat  Progress Towards Goal(s):  In progress.   Nutritional Diagnosis:  NB-3.3 Limited access to nutrition-related supplies As related to  healthful foods and diabetes medications.  As evidenced by food insecurity and limited transportation.    Intervention:  Nutrition counseling provided.  Discussed role of medications and diet in glucose control.  Recommended mail order pharmacy so she can have her insulin delivered to her home so she doesn't have to miss doses  Discussed effects of physical activity on glucose levels and long-term glucose control.  Recommended 150 minutes of physical activity/week.   Barriers to learning/adherance to  lifestyle change: access to care  Diabetes self-care support plan:   none  Monitoring/Evaluation:  Dietary intake, exercise, blood glucose, and body weight prn.

## 2013-10-31 ENCOUNTER — Other Ambulatory Visit: Payer: Self-pay | Admitting: Family Medicine

## 2013-10-31 NOTE — Telephone Encounter (Signed)
Refill Zyrtec and Nasonex for one year. Call in Zolpidem 10 mg qhs #30 with 5 rf

## 2013-11-06 DIAGNOSIS — E119 Type 2 diabetes mellitus without complications: Secondary | ICD-10-CM | POA: Diagnosis not present

## 2013-11-07 ENCOUNTER — Telehealth: Payer: Self-pay | Admitting: Family Medicine

## 2013-11-07 MED ORDER — INSULIN ASPART PROT & ASPART (70-30 MIX) 100 UNIT/ML ~~LOC~~ SUSP: 18.0000 [IU] | Freq: Two times a day (BID) | SUBCUTANEOUS | Status: DC

## 2013-11-07 NOTE — Telephone Encounter (Signed)
Pt request refill of insulin aspart protamine- aspart (NOVOLOG MIX 70/30) (70-30) 100 UNIT/ML injection  2 x/day pt has requested from pharm for a week w/ no response. Now pt will be out today. [facility]/ [place], [place]

## 2013-11-07 NOTE — Telephone Encounter (Signed)
rx sent in electronically 

## 2013-11-09 DIAGNOSIS — G4710 Hypersomnia, unspecified: Secondary | ICD-10-CM | POA: Diagnosis not present

## 2013-11-09 DIAGNOSIS — K219 Gastro-esophageal reflux disease without esophagitis: Secondary | ICD-10-CM | POA: Diagnosis not present

## 2013-11-09 DIAGNOSIS — Z23 Encounter for immunization: Secondary | ICD-10-CM | POA: Diagnosis not present

## 2013-11-09 DIAGNOSIS — R0609 Other forms of dyspnea: Secondary | ICD-10-CM | POA: Diagnosis not present

## 2013-11-09 DIAGNOSIS — J3089 Other allergic rhinitis: Secondary | ICD-10-CM | POA: Diagnosis not present

## 2013-11-15 ENCOUNTER — Telehealth: Payer: Self-pay | Admitting: Family Medicine

## 2013-11-15 MED ORDER — INSULIN ASPART PROT & ASPART (70-30 MIX) 100 UNIT/ML PEN: 18.0000 [IU] | PEN_INJECTOR | Freq: Two times a day (BID) | SUBCUTANEOUS | Status: DC

## 2013-11-15 MED ORDER — INSULIN PEN NEEDLE 31G X 8 MM MISC: Status: DC

## 2013-11-15 NOTE — Telephone Encounter (Signed)
Pharm called for pt and this rx needs to be the Novolg Pen. Pt went to [facility] and did not get this rx and she is out!! [phone]

## 2013-11-15 NOTE — Telephone Encounter (Signed)
I resent script for insulin and needles to pharmacy and spoke with pt.

## 2013-11-15 NOTE — Addendum Note (Signed)
Addended by: Aniceto Boss A on: 11/15/2013 12:51 PM   Modules accepted: Orders

## 2013-11-20 NOTE — Telephone Encounter (Signed)
done

## 2013-12-25 DIAGNOSIS — K219 Gastro-esophageal reflux disease without esophagitis: Secondary | ICD-10-CM | POA: Diagnosis not present

## 2013-12-25 DIAGNOSIS — G4730 Sleep apnea, unspecified: Secondary | ICD-10-CM | POA: Diagnosis not present

## 2013-12-25 DIAGNOSIS — R0609 Other forms of dyspnea: Secondary | ICD-10-CM | POA: Diagnosis not present

## 2013-12-25 DIAGNOSIS — G4710 Hypersomnia, unspecified: Secondary | ICD-10-CM | POA: Diagnosis not present

## 2013-12-25 DIAGNOSIS — R0989 Other specified symptoms and signs involving the circulatory and respiratory systems: Secondary | ICD-10-CM | POA: Diagnosis not present

## 2013-12-25 DIAGNOSIS — J3089 Other allergic rhinitis: Secondary | ICD-10-CM | POA: Diagnosis not present

## 2013-12-27 ENCOUNTER — Other Ambulatory Visit: Payer: Self-pay | Admitting: Family Medicine

## 2014-01-01 DIAGNOSIS — G4730 Sleep apnea, unspecified: Secondary | ICD-10-CM | POA: Diagnosis not present

## 2014-01-01 DIAGNOSIS — J8289 Other pulmonary eosinophilia, not elsewhere classified: Secondary | ICD-10-CM | POA: Diagnosis not present

## 2014-01-01 DIAGNOSIS — K219 Gastro-esophageal reflux disease without esophagitis: Secondary | ICD-10-CM | POA: Diagnosis not present

## 2014-01-01 DIAGNOSIS — J3089 Other allergic rhinitis: Secondary | ICD-10-CM | POA: Diagnosis not present

## 2014-01-01 DIAGNOSIS — G4710 Hypersomnia, unspecified: Secondary | ICD-10-CM | POA: Diagnosis not present

## 2014-01-08 DIAGNOSIS — R059 Cough, unspecified: Secondary | ICD-10-CM | POA: Diagnosis not present

## 2014-01-08 DIAGNOSIS — R0609 Other forms of dyspnea: Secondary | ICD-10-CM | POA: Diagnosis not present

## 2014-01-08 DIAGNOSIS — R05 Cough: Secondary | ICD-10-CM | POA: Diagnosis not present

## 2014-01-08 DIAGNOSIS — R0989 Other specified symptoms and signs involving the circulatory and respiratory systems: Secondary | ICD-10-CM | POA: Diagnosis not present

## 2014-01-15 DIAGNOSIS — G4730 Sleep apnea, unspecified: Secondary | ICD-10-CM | POA: Diagnosis not present

## 2014-01-15 DIAGNOSIS — G4710 Hypersomnia, unspecified: Secondary | ICD-10-CM | POA: Diagnosis not present

## 2014-02-12 DIAGNOSIS — R918 Other nonspecific abnormal finding of lung field: Secondary | ICD-10-CM | POA: Diagnosis not present

## 2014-02-12 DIAGNOSIS — R0609 Other forms of dyspnea: Secondary | ICD-10-CM | POA: Diagnosis not present

## 2014-02-12 DIAGNOSIS — G4710 Hypersomnia, unspecified: Secondary | ICD-10-CM | POA: Diagnosis not present

## 2014-02-12 DIAGNOSIS — J3089 Other allergic rhinitis: Secondary | ICD-10-CM | POA: Diagnosis not present

## 2014-02-21 ENCOUNTER — Other Ambulatory Visit: Payer: Self-pay | Admitting: Family Medicine

## 2014-04-05 DIAGNOSIS — R0989 Other specified symptoms and signs involving the circulatory and respiratory systems: Secondary | ICD-10-CM | POA: Diagnosis not present

## 2014-04-05 DIAGNOSIS — K219 Gastro-esophageal reflux disease without esophagitis: Secondary | ICD-10-CM | POA: Diagnosis not present

## 2014-04-05 DIAGNOSIS — J3089 Other allergic rhinitis: Secondary | ICD-10-CM | POA: Diagnosis not present

## 2014-04-05 DIAGNOSIS — G4710 Hypersomnia, unspecified: Secondary | ICD-10-CM | POA: Diagnosis not present

## 2014-04-05 DIAGNOSIS — R0609 Other forms of dyspnea: Secondary | ICD-10-CM | POA: Diagnosis not present

## 2014-04-05 DIAGNOSIS — G4730 Sleep apnea, unspecified: Secondary | ICD-10-CM | POA: Diagnosis not present

## 2014-04-15 DIAGNOSIS — E119 Type 2 diabetes mellitus without complications: Secondary | ICD-10-CM | POA: Diagnosis not present

## 2014-04-17 ENCOUNTER — Other Ambulatory Visit: Payer: Self-pay | Admitting: Family Medicine

## 2014-04-19 NOTE — Telephone Encounter (Signed)
Call in Zolpidem #90 with one rf. Also refill Proair and Symbicort for one year

## 2014-04-24 ENCOUNTER — Emergency Department (EMERGENCY_DEPARTMENT_HOSPITAL)
Admission: EM | Admit: 2014-04-24 | Discharge: 2014-04-25 | Disposition: A | Discharge status: Other Acute Inpatient Hospital | Payer: Medicare Other | Source: Home / Self Care | Attending: Emergency Medicine | Admitting: Emergency Medicine

## 2014-04-24 ENCOUNTER — Encounter (HOSPITAL_COMMUNITY): Payer: Self-pay | Admitting: Emergency Medicine

## 2014-04-24 ENCOUNTER — Telehealth: Payer: Self-pay | Admitting: Family Medicine

## 2014-04-24 DIAGNOSIS — Z87448 Personal history of other diseases of urinary system: Secondary | ICD-10-CM

## 2014-04-24 DIAGNOSIS — F4310 Post-traumatic stress disorder, unspecified: Secondary | ICD-10-CM | POA: Diagnosis not present

## 2014-04-24 DIAGNOSIS — R45850 Homicidal ideations: Secondary | ICD-10-CM | POA: Diagnosis not present

## 2014-04-24 DIAGNOSIS — I10 Essential (primary) hypertension: Secondary | ICD-10-CM

## 2014-04-24 DIAGNOSIS — K219 Gastro-esophageal reflux disease without esophagitis: Secondary | ICD-10-CM | POA: Diagnosis not present

## 2014-04-24 DIAGNOSIS — IMO0002 Reserved for concepts with insufficient information to code with codable children: Secondary | ICD-10-CM

## 2014-04-24 DIAGNOSIS — Z8701 Personal history of pneumonia (recurrent): Secondary | ICD-10-CM

## 2014-04-24 DIAGNOSIS — G2581 Restless legs syndrome: Secondary | ICD-10-CM | POA: Diagnosis present

## 2014-04-24 DIAGNOSIS — G4700 Insomnia, unspecified: Secondary | ICD-10-CM | POA: Diagnosis present

## 2014-04-24 DIAGNOSIS — J45909 Unspecified asthma, uncomplicated: Secondary | ICD-10-CM | POA: Diagnosis present

## 2014-04-24 DIAGNOSIS — R45851 Suicidal ideations: Secondary | ICD-10-CM | POA: Diagnosis not present

## 2014-04-24 DIAGNOSIS — Z9981 Dependence on supplemental oxygen: Secondary | ICD-10-CM

## 2014-04-24 DIAGNOSIS — F411 Generalized anxiety disorder: Secondary | ICD-10-CM | POA: Diagnosis not present

## 2014-04-24 DIAGNOSIS — Z794 Long term (current) use of insulin: Secondary | ICD-10-CM

## 2014-04-24 DIAGNOSIS — Z3202 Encounter for pregnancy test, result negative: Secondary | ICD-10-CM | POA: Insufficient documentation

## 2014-04-24 DIAGNOSIS — E119 Type 2 diabetes mellitus without complications: Secondary | ICD-10-CM

## 2014-04-24 DIAGNOSIS — F410 Panic disorder [episodic paroxysmal anxiety] without agoraphobia: Secondary | ICD-10-CM | POA: Diagnosis present

## 2014-04-24 DIAGNOSIS — Z91040 Latex allergy status: Secondary | ICD-10-CM

## 2014-04-24 DIAGNOSIS — F329 Major depressive disorder, single episode, unspecified: Secondary | ICD-10-CM

## 2014-04-24 DIAGNOSIS — F322 Major depressive disorder, single episode, severe without psychotic features: Secondary | ICD-10-CM | POA: Diagnosis not present

## 2014-04-24 DIAGNOSIS — Z79899 Other long term (current) drug therapy: Secondary | ICD-10-CM | POA: Insufficient documentation

## 2014-04-24 DIAGNOSIS — R739 Hyperglycemia, unspecified: Secondary | ICD-10-CM

## 2014-04-24 DIAGNOSIS — Z880 Allergy status to penicillin: Secondary | ICD-10-CM | POA: Insufficient documentation

## 2014-04-24 DIAGNOSIS — G4730 Sleep apnea, unspecified: Secondary | ICD-10-CM | POA: Insufficient documentation

## 2014-04-24 DIAGNOSIS — F3289 Other specified depressive episodes: Secondary | ICD-10-CM | POA: Diagnosis not present

## 2014-04-24 LAB — COMPREHENSIVE METABOLIC PANEL
ALBUMIN: 3.4 g/dL — AB (ref 3.5–5.2)
ALT: 15 U/L (ref 0–35)
AST: 14 U/L (ref 0–37)
Alkaline Phosphatase: 123 U/L — ABNORMAL HIGH (ref 39–117)
BUN: 15 mg/dL (ref 6–23)
CALCIUM: 9.4 mg/dL (ref 8.4–10.5)
CO2: 25 meq/L (ref 19–32)
Chloride: 92 mEq/L — ABNORMAL LOW (ref 96–112)
Creatinine, Ser: 0.42 mg/dL — ABNORMAL LOW (ref 0.50–1.10)
GFR calc Af Amer: >90 mL/min (ref >90)
Glucose, Bld: 588 mg/dL (ref 70–99)
Potassium: 4 mEq/L (ref 3.7–5.3)
SODIUM: 132 meq/L — AB (ref 137–147)
TOTAL PROTEIN: 7.6 g/dL (ref 6.0–8.3)
Total Bilirubin: 0.2 mg/dL — ABNORMAL LOW (ref 0.3–1.2)

## 2014-04-24 LAB — SALICYLATE LEVEL: Salicylate Lvl: 2.7 mg/dL — ABNORMAL LOW (ref 2.8–20.0)

## 2014-04-24 LAB — URINALYSIS, ROUTINE W REFLEX MICROSCOPIC
Bilirubin Urine: NEGATIVE
Glucose, UA: >1000 mg/dL — AB
Ketones, ur: NEGATIVE mg/dL
Leukocytes, UA: NEGATIVE
NITRITE: NEGATIVE
PH: 5 (ref 5.0–8.0)
Protein, ur: NEGATIVE mg/dL
Specific Gravity, Urine: 1.039 — ABNORMAL HIGH (ref 1.005–1.030)
UROBILINOGEN UA: 0.2 mg/dL (ref 0.0–1.0)

## 2014-04-24 LAB — CBG MONITORING, ED
GLUCOSE-CAPILLARY: 436 mg/dL — AB (ref 70–99)
Glucose-Capillary: 580 mg/dL (ref 70–99)
Glucose-Capillary: 283 mg/dL — ABNORMAL HIGH (ref 70–99)
Glucose-Capillary: 205 mg/dL — ABNORMAL HIGH (ref 70–99)

## 2014-04-24 LAB — CBC WITH DIFFERENTIAL/PLATELET
BASOS ABS: 0 10*3/uL (ref 0.0–0.1)
BASOS PCT: 0 % (ref 0–1)
EOS PCT: 0 % (ref 0–5)
Eosinophils Absolute: 0 10*3/uL (ref 0.0–0.7)
HCT: 36.8 % (ref 36.0–46.0)
Hemoglobin: 12.6 g/dL (ref 12.0–15.0)
LYMPHS PCT: 31 % (ref 12–46)
Lymphs Abs: 2.1 10*3/uL (ref 0.7–4.0)
MCH: 27 pg (ref 26.0–34.0)
MCHC: 34.2 g/dL (ref 30.0–36.0)
MCV: 79 fL (ref 78.0–100.0)
Monocytes Absolute: 0.5 10*3/uL (ref 0.1–1.0)
Monocytes Relative: 7 % (ref 3–12)
Neutro Abs: 4.2 10*3/uL (ref 1.7–7.7)
Neutrophils Relative %: 62 % (ref 43–77)
PLATELETS: 237 10*3/uL (ref 150–400)
RBC: 4.66 MIL/uL (ref 3.87–5.11)
RDW: 13.3 % (ref 11.5–15.5)
WBC: 6.9 10*3/uL (ref 4.0–10.5)

## 2014-04-24 LAB — RAPID URINE DRUG SCREEN, HOSP PERFORMED
Amphetamines: NOT DETECTED
BENZODIAZEPINES: NOT DETECTED
Barbiturates: NOT DETECTED
COCAINE: NOT DETECTED
OPIATES: NOT DETECTED
Tetrahydrocannabinol: NOT DETECTED

## 2014-04-24 LAB — URINE MICROSCOPIC-ADD ON

## 2014-04-24 LAB — ETHANOL

## 2014-04-24 LAB — ACETAMINOPHEN LEVEL: Acetaminophen (Tylenol), Serum: <15 ug/mL (ref 10–30)

## 2014-04-24 LAB — POC URINE PREG, ED: PREG TEST UR: NEGATIVE

## 2014-04-24 MED ORDER — BUDESONIDE-FORMOTEROL FUMARATE 160-4.5 MCG/ACT IN AERO
2.0000 | INHALATION_SPRAY | Freq: Two times a day (BID) | RESPIRATORY_TRACT | Status: DC
  Administered 2014-04-24 – 2014-04-25 (×2): 2 via RESPIRATORY_TRACT
  Filled 2014-04-24: qty 6

## 2014-04-24 MED ORDER — NICOTINE 21 MG/24HR TD PT24
21.0000 mg | MEDICATED_PATCH | Freq: Every day | TRANSDERMAL | Status: DC
  Filled 2014-04-24: qty 1

## 2014-04-24 MED ORDER — ACETAMINOPHEN 325 MG PO TABS
650.0000 mg | ORAL_TABLET | ORAL | Status: DC | PRN
  Administered 2014-04-25: 650 mg via ORAL
  Filled 2014-04-24: qty 2

## 2014-04-24 MED ORDER — INSULIN ASPART 100 UNIT/ML ~~LOC~~ SOLN
10.0000 [IU] | Freq: Once | SUBCUTANEOUS | Status: AC
  Administered 2014-04-24: 10 [IU] via INTRAVENOUS
  Filled 2014-04-24: qty 1

## 2014-04-24 MED ORDER — MONTELUKAST SODIUM 10 MG PO TABS
10.0000 mg | ORAL_TABLET | Freq: Every day | ORAL | Status: DC
  Administered 2014-04-24: 10 mg via ORAL
  Filled 2014-04-24 (×2): qty 1

## 2014-04-24 MED ORDER — NYSTATIN 100000 UNIT/GM EX CREA: 1.0000 "application " | TOPICAL_CREAM | Freq: Two times a day (BID) | CUTANEOUS | Status: DC | PRN

## 2014-04-24 MED ORDER — PANTOPRAZOLE SODIUM 40 MG PO TBEC
80.0000 mg | DELAYED_RELEASE_TABLET | Freq: Every day | ORAL | Status: DC
  Administered 2014-04-25: 80 mg via ORAL
  Filled 2014-04-24: qty 2

## 2014-04-24 MED ORDER — OXYBUTYNIN CHLORIDE ER 10 MG PO TB24
10.0000 mg | ORAL_TABLET | Freq: Every day | ORAL | Status: DC
  Administered 2014-04-24 – 2014-04-25 (×2): 10 mg via ORAL
  Filled 2014-04-24 (×4): qty 1

## 2014-04-24 MED ORDER — ONDANSETRON HCL 4 MG PO TABS: 4.0000 mg | ORAL_TABLET | Freq: Three times a day (TID) | ORAL | Status: DC | PRN

## 2014-04-24 MED ORDER — SODIUM CHLORIDE 0.9 % IV BOLUS (SEPSIS)
1000.0000 mL | Freq: Once | INTRAVENOUS | Status: AC
  Administered 2014-04-24: 1000 mL via INTRAVENOUS

## 2014-04-24 MED ORDER — ROPINIROLE HCL 1 MG PO TABS
1.0000 mg | ORAL_TABLET | Freq: Every day | ORAL | Status: DC
  Administered 2014-04-24: 1 mg via ORAL
  Filled 2014-04-24 (×2): qty 1

## 2014-04-24 MED ORDER — INSULIN ASPART PROT & ASPART (70-30 MIX) 100 UNIT/ML ~~LOC~~ SUSP
18.0000 [IU] | Freq: Every day | SUBCUTANEOUS | Status: DC
  Administered 2014-04-24: 18 [IU] via SUBCUTANEOUS
  Filled 2014-04-24: qty 10

## 2014-04-24 MED ORDER — VENLAFAXINE HCL ER 75 MG PO CP24
75.0000 mg | ORAL_CAPSULE | Freq: Every day | ORAL | Status: DC
  Administered 2014-04-25: 75 mg via ORAL
  Filled 2014-04-24 (×2): qty 1

## 2014-04-24 MED ORDER — ALUM & MAG HYDROXIDE-SIMETH 200-200-20 MG/5ML PO SUSP: 30.0000 mL | ORAL | Status: DC | PRN

## 2014-04-24 MED ORDER — ALBUTEROL SULFATE (2.5 MG/3ML) 0.083% IN NEBU
2.5000 mg | INHALATION_SOLUTION | Freq: Four times a day (QID) | RESPIRATORY_TRACT | Status: DC | PRN
  Filled 2014-04-24: qty 3

## 2014-04-24 MED ORDER — LORATADINE 10 MG PO TABS
10.0000 mg | ORAL_TABLET | Freq: Every day | ORAL | Status: DC
  Administered 2014-04-24 – 2014-04-25 (×2): 10 mg via ORAL
  Filled 2014-04-24 (×3): qty 1

## 2014-04-24 NOTE — ED Notes (Signed)
Bed: WA20 Expected date:  Expected time:  Means of arrival:  Comments: Hold tr 4

## 2014-04-24 NOTE — Telephone Encounter (Signed)
I spoke with Isabel [name] and per Dr. Clent RidgesFry asks that the pt request where she wants to be treated at. We do not have any authority over this matter.

## 2014-04-24 NOTE — Telephone Encounter (Signed)
Error/gd °

## 2014-04-24 NOTE — ED Notes (Addendum)
MD Ward ok to give Novolog 70/30. Gave [place] sandwich d/t high doses of insulin pt isn't used to.

## 2014-04-24 NOTE — ED Notes (Signed)
Pt states that she and "[facility] don't get along".  States taht she got in a disagreement and wanted to stab herself.  Has a plan to stab her cousin.

## 2014-04-24 NOTE — ED Notes (Signed)
Initial contact- A&O x4. Sitter at bedside. Pt made a phone call to "get help" aside from her day program. Confirms having suicidal ideation with a plan. Wants to kill her cousin and herself with a knife. When asked why she wanted to kill her cousin she didn't have a reason. Has a knife to carry out this plan with. In NAD.

## 2014-04-24 NOTE — ED Notes (Signed)
Bed: AO13WA25 Expected date:  Expected time:  Means of arrival:  Comments: Hold

## 2014-04-24 NOTE — Telephone Encounter (Signed)
Mobile crisis is calling regarding pt, states [facility], because of suicidal threats to herself and others, would like to know if dr. Clent RidgesFry can call [facility] and advise them to [facility].

## 2014-04-24 NOTE — ED Notes (Signed)
In to give patient insulin and second bolus. TTS at bedside. Will go in after conference finished.

## 2014-04-24 NOTE — ED Notes (Addendum)
Report given to [facility], RN

## 2014-04-24 NOTE — ED Provider Notes (Signed)
TIME SEEN: 6:53 PM  CHIEF COMPLAINT: SI, HI  HPI: Patient is a 58 year old insulin-dependent diabetic with history of hypertension as well, depression, prior suicide attempt who presents the emergency department with suicidal ideation and homicidal ideation. Patient is here with a mobile crisis staff. He states she is having a meeting with "[facility]" to work on getting her outpatient medical care when she threatened to stab herself and stab her cousin. She denies any hallucinations. She states she feel she needs inpatient psychiatric treatment. Denies any drug alcohol use. She states she's had several weeks of left shoulder pain is worse with movement of her arm. No numbness or weakness. No chest pain or shortness of breath. No fever. No vomiting or diarrhea.  ROS: See HPI Constitutional: no fever  Eyes: no drainage  ENT: no runny nose   Cardiovascular:  no chest pain  Resp: no SOB  GI: no vomiting GU: no dysuria Integumentary: no rash  Allergy: no hives  Musculoskeletal: no leg swelling  Neurological: no slurred speech ROS otherwise negative  PAST MEDICAL HISTORY/PAST SURGICAL HISTORY:  Past Medical History  Diagnosis Date  . Allergy   . Asthma   . Depression   . GERD (gastroesophageal reflux disease)   . Hypertension   . Cystitis, interstitial   . Pneumonia   . Diabetes mellitus without complication   . Pancreatic pseudocyst   . Sleep apnea     wears a CPAP at night     MEDICATIONS:  Prior to Admission medications   Medication Sig Start Date End Date Taking? Authorizing Provider  budesonide-formoterol (SYMBICORT) 160-4.5 MCG/ACT inhaler Inhale 2 puffs into the lungs 2 (two) times daily.    Historical Provider, MD  cetirizine (ZYRTEC) 10 MG tablet TAKE 1 TABLET BY MOUTH EVERY DAY 10/31/13   Nelwyn SalisburyStephen A Fry, MD  desvenlafaxine (PRISTIQ) 50 MG 24 hr tablet Take 50 mg by mouth daily.    Historical Provider, MD  esomeprazole (NEXIUM) 40 MG capsule Take 1 capsule (40 mg total) by  mouth daily. 10/09/12   Nelwyn SalisburyStephen A Fry, MD  glucose blood (RELION PRIME TEST) test strip Use as instructed 05/11/13   Nelwyn SalisburyStephen A Fry, MD  imipramine (TOFRANIL) 50 MG tablet Take 1 tablet (50 mg total) by mouth at bedtime. 10/09/12   Nelwyn SalisburyStephen A Fry, MD  Insulin Aspart Prot & Aspart (70-30) 100 UNIT/ML SUPN Inject 18 Units into the skin 2 (two) times daily. 11/15/13   Nelwyn SalisburyStephen A Fry, MD  Insulin Pen Needle 31G X 8 MM MISC Use as directed 11/15/13   Nelwyn SalisburyStephen A Fry, MD  montelukast (SINGULAIR) 10 MG tablet Take 1 tablet (10 mg total) by mouth at bedtime. 12/21/12   Nelwyn SalisburyStephen A Fry, MD  NASONEX 50 MCG/ACT nasal spray INHALE 2 [facility]YS [facility]IL EVERY DAY 10/31/13   Nelwyn SalisburyStephen A Fry, MD  nystatin cream (MYCOSTATIN) APPLY TO SKIN  TWICE DAILY  *CONTACT DOCTOR'S OFFICE FOR REFILLS* 06/07/13   Nelwyn SalisburyStephen A Fry, MD  oxybutynin (DITROPAN-XL) 10 MG 24 hr tablet Take 10 mg by mouth daily.    Historical Provider, MD  oxybutynin (DITROPAN-XL) 10 MG 24 hr tablet TAKE 1 TABLET BY MOUTH EVERY DAY    Nelwyn SalisburyStephen A Fry, MD  PRISTIQ 50 MG 24 hr tablet TAKE 1 TABLET BY MOUTH DAILY    Nelwyn SalisburyStephen A Fry, MD  PROAIR HFA 108 (90 BASE) MCG/ACT inhaler INHALE 2 PUFFS BY MOUTH FOUR TIMES A DAY AS NEEDED    Nelwyn SalisburyStephen A Fry, MD  rOPINIRole (REQUIP) 1 MG tablet Take 1 mg by mouth at bedtime.  11/14/12   Nelwyn SalisburyStephen A Fry, MD  rOPINIRole (REQUIP) 1 MG tablet TAKE 1 TABLET BY MOUTH EVERY NIGHT AT BEDTIME    Nelwyn SalisburyStephen A Fry, MD  SYMBICORT 160-4.5 MCG/ACT inhaler INHALE 2 PUFFS BY MOUTH TWICE DAILY    Nelwyn SalisburyStephen A Fry, MD    ALLERGIES:  Allergies  Allergen Reactions  . Aspirin     REACTION: unspecified  . Latex     rash  . Penicillins     REACTION: unspecified  . Povidone-Iodine     REACTION: unspecified  . Sulfamethoxazole     REACTION: unspecified    SOCIAL HISTORY:  History  Substance Use Topics  . Smoking status: Never Smoker   . Smokeless tobacco: Never Used  . Alcohol Use: No    FAMILY HISTORY: Family History  Problem Relation Age of  Onset  . Cancer      other    EXAM: BP 121/83  Pulse 74  Temp(Src) 98.6 F (37 C) (Oral)  Resp 20  SpO2 98% CONSTITUTIONAL: Alert and oriented and responds appropriately to questions. Well-appearing; well-nourished HEAD: Normocephalic EYES: Conjunctivae clear, PERRL ENT: normal nose; no rhinorrhea; moist mucous membranes; pharynx without lesions noted NECK: Supple, no meningismus, no LAD  CARD: RRR; S1 and S2 appreciated; no murmurs, no clicks, no rubs, no gallops RESP: Normal chest excursion without splinting or tachypnea; breath sounds clear and equal bilaterally; no wheezes, no rhonchi, no rales,  ABD/GI: Normal bowel sounds; non-distended; soft, non-tender, no rebound, no guarding BACK:  The back appears normal and is non-tender to palpation, there is no CVA tenderness EXT: Pain with movement of the left shoulder but no joint effusion or crepitus, no erythema or warmth, no induration, 2+ radial pulses bilaterally, sensation to light touch intact diffusely, no bony deformity, Normal ROM in all joints; otherwise extremities are non-tender to palpation; no edema; normal capillary refill; no cyanosis    SKIN: Normal color for age and race; warm NEURO: Moves all extremities equally PSYCH: The patient endorses suicidal ideation with plan to stab herself and thoughts of wanting to stab her cousin. No hallucinations. Grooming and personal hygiene are appropriate.  MEDICAL DECISION MAKING: Patient here with SI and HI. She agrees to stay voluntarily. She is having left shoulder pain but I suspect this is secondary to rotator cuff tendinitis. EKG shows no ischemic changes to suggest that her left shoulder pain is cardiac in nature. Will obtain screening labs and urine. Will consult psychiatry.  ED PROGRESS: Patient's blood glucose is 580. Labs pending. We'll give IV fluids. She states she did eat a piece of chocolate cake prior to coming to the emergency department. We'll give IV  insulin.  7:58 PM  Pt's anion gap is only mildly elevated at 15. She is getting IV fluids and insulin.  9:51 PM  Pt's glucose is slowly improving. Psychiatry as evaluated patient and feels that she needs to be reassessed in the morning as they feel many of her issues are social in nature. Patient reports that she does have a good relationship with her family and [place] She currently does not have a PCP or outpatient psychology/psychiatry and that is what [facility] been working on helping her set up when she told them she was going to kill herself today.   10:03 PM Pt's glucose is now 283. She is currently medically cleared and awaiting psychiatry reevaluation in  the morning.    EKG Interpretation  Date/Time:  Wednesday Apr 24 2014 19:04:34 EDT Ventricular Rate:  78 PR Interval:  169 QRS Duration: 93 QT Interval:  389 QTC Calculation: 443 R Axis:   -84 Text Interpretation:  Sinus rhythm Left anterior fascicular block Consider anterior infarct Baseline wander in lead(s) V6 No significant change since last tracing Confirmed by Jeweliana Dudgeon,  DO, Renad Jenniges [phone]) on 04/24/2014 7:09:44 PM        Layla Maw Donato Studley, DO 04/24/14 [id number]

## 2014-04-24 NOTE — BH Assessment (Signed)
Assessment Note  Isabel [name] is an 58 y.o. female. Patient was brought into the ED by [facility] because of threats to kill self with knife and killing her cousin by stabbing.  It was reported that the patient participated in a treatment team meeting with [facility] and [facility], the [facility] for the [facility] was denied. Patient became very upset and threatened that she would kill herself. Patient continues to endorse SI with plan to stab self but denies HI towards her cousin.  Patient reports 15 previous suicide attempts but only one hospitalization which was last year. She reports because her medical state was unstable they admitted her to the medical floor of the hospital and she was never transferred to a psychiatric unit.  "tired of the struggle they keep giving me".  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 Patient denies current HI, hallucination, and self injurious behaviors. Patient is adamant that she is in of day treatment services and [place].   It was reported during the transition from [facility] provider that a IDD care coordination referral was completed and an APS reports will be completed for safety.    CSW spoke with the patient's cousin and roommate to collect collateral information.  She reports that the patient can return to her home only with a plan to get her own housing.  She reports that the patient has attempted suicide several times in the past and she believes she would hurt herself this time.  She reports that she do not have any fear that the patient will harm her or follow through with homicidal threats.    CSW ran patient with Karleen HampshireSpencer, [place] and Dr. Elesa MassedWard it is recommended for the patient to be stabilized medically and reassessed in the morning.  CSW will provide the patient with information on the PACE program and  CSW will follow up .    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  Axis III:  Past Medical History  Diagnosis Date  . Allergy   . Asthma   . Depression   . GERD (gastroesophageal reflux disease)   . Hypertension   . Cystitis, interstitial   . Pneumonia   . Diabetes mellitus without complication   . Pancreatic pseudocyst   . Sleep apnea     wears a CPAP at night    Axis IV: housing problems, other psychosocial or environmental problems, problems related to social environment, problems with access to health care services and problems with primary support group Axis V: 41-50 serious symptoms  Past Medical History:  Past Medical History  Diagnosis Date  . Allergy   . Asthma   . Depression   . GERD (gastroesophageal reflux disease)   . Hypertension   . Cystitis, interstitial   . Pneumonia   . Diabetes mellitus without complication   . Pancreatic pseudocyst   . Sleep apnea     wears a CPAP at night     Past Surgical History  Procedure Laterality Date  . Cystgastrostomy  09-02-12    [facility]   . Cholecystectomy      Family History:  Family History  Problem Relation Age of Onset  . Cancer      other    Social History:  reports that  she has never smoked. She has never used smokeless tobacco. She reports that she does not drink alcohol or use illicit drugs.  Additional Social History:     CIWA: CIWA-Ar BP: 121/83 mmHg Pulse Rate: 74 COWS:    Allergies:  Allergies  Allergen Reactions  . Aspirin     REACTION: unspecified  . Latex     rash  . Penicillins     REACTION: unspecified  . Povidone-Iodine     REACTION: unspecified  . Sulfamethoxazole     REACTION: unspecified    Home Medications:  (Not in a hospital admission)  OB/GYN Status:  No LMP recorded. Patient is not currently having periods (Reason: Other).  General Assessment Data Location of  Assessment: [facility] ACT Assessment: Yes Is this a Tele or Face-to-Face Assessment?: Face-to-Face Is this an Initial Assessment or a Re-assessment for this encounter?: Initial Assessment Living Arrangements: Other relatives (cousin) Can pt return to current living arrangement?: No Admission Status: Voluntary Is patient capable of signing voluntary admission?: Yes Transfer from: Home Referral Source: Self/Family/Friend  Medical Screening Exam [facility] Walk-in ONLY) Medical Exam completed: Yes  [facility] Care Plan Living Arrangements: Other relatives (cousin) Name of Psychiatrist: [facility]  Education Status Is patient currently in school?: No  Risk to self Suicidal Ideation: Yes-Currently Present Suicidal Intent: Yes-Currently Present Is patient at risk for suicide?: Yes Suicidal Plan?: Yes-Currently Present Specify Current Suicidal Plan: stab self Access to Means: Yes Specify Access to Suicidal Means: kitchen knives What has been your use of drugs/alcohol within the last 12 months?: none Previous Attempts/Gestures: Yes How many times?: 15 Triggers for Past Attempts: Family contact;Anniversary (death of parent) Intentional Self Injurious Behavior: None Recent stressful life event(s): Loss (Comment);Conflict (Comment);Other (Comment) (access to community services) Persecutory voices/beliefs?: No Depression: Yes Depression Symptoms: Fatigue;Loss of interest in usual pleasures;Feeling angry/irritable Substance abuse history and/or treatment for substance abuse?: No  Risk to Others Homicidal Ideation: No-Not Currently/Within Last 6 Months Thoughts of Harm to Others: No-Not Currently Present/Within Last 6 Months Current Homicidal Intent: No-Not Currently/Within Last 6 Months Current Homicidal Plan: No-Not Currently/Within Last 6 Months Access to Homicidal Means: No History of harm to others?:  (Past threats towards Cousin) Assessment of Violence: On admission Violent  Behavior Description: threatened to stab cousin Does patient have access to weapons?: No Criminal Charges Pending?: No Does patient have a court date: No  Psychosis Hallucinations: None noted Delusions: None noted  Mental Status Report Appear/Hygiene: In hospital gown Eye Contact: Fair Motor Activity: Freedom of movement Speech: Logical/coherent Level of Consciousness: Alert;Irritable Mood: Depressed;Anhedonia;Worthless, low self-esteem;Irritable Affect: Depressed;Irritable;Flat Anxiety Level: Minimal Thought Processes: Relevant Judgement: Unimpaired Orientation: Place;Person;Time;Situation Obsessive Compulsive Thoughts/Behaviors: None  Cognitive Functioning Concentration: Decreased Memory: Recent Intact;Remote Intact IQ: Average Insight: Fair Impulse Control: Fair Appetite: Fair Sleep: No Change  ADLScreening [facility] Assessment Services) Patient's cognitive ability adequate to safely complete daily activities?: Yes Patient able to express need for assistance with ADLs?: Yes Independently performs ADLs?: Yes (appropriate for developmental age)  Prior Inpatient Therapy Prior Inpatient Therapy: No  Prior Outpatient Therapy Prior Outpatient Therapy: Yes Prior Therapy Facilty/Provider(s): [facility], [facility]  ADL Screening (condition at time of admission) Patient's cognitive ability adequate to safely complete daily activities?: Yes Patient able to express need for assistance with ADLs?: Yes Independently performs ADLs?: Yes (appropriate for developmental age)         Values / Beliefs Cultural Requests During Hospitalization: None Spiritual Requests During Hospitalization: None  Additional Information 1:1 In Past 12 Months?: No CIRT Risk: No Elopement Risk: No Does patient have medical clearance?: Yes     Disposition:  Disposition Initial Assessment Completed for this Encounter: Yes Disposition of Patient: Other dispositions Other  disposition(s): Other (Comment) (Reassessed in the morning once medically cleared.  )  On Site Evaluation by:   Reviewed with Physician:    Phoebe Perch 04/24/2014 9:34 PM

## 2014-04-24 NOTE — ED Notes (Signed)
Social Worker at Bedside

## 2014-04-25 ENCOUNTER — Inpatient Hospital Stay (HOSPITAL_COMMUNITY)
Admission: AD | Admit: 2014-04-25 | Discharge: 2014-05-03 | DRG: 885 | Disposition: A | Discharge status: Home / Self Care | Payer: Medicare Other | Source: Intra-hospital | Attending: Psychiatry | Admitting: Psychiatry

## 2014-04-25 ENCOUNTER — Encounter (HOSPITAL_COMMUNITY): Payer: Self-pay | Admitting: *Deleted

## 2014-04-25 DIAGNOSIS — E669 Obesity, unspecified: Secondary | ICD-10-CM

## 2014-04-25 DIAGNOSIS — E119 Type 2 diabetes mellitus without complications: Secondary | ICD-10-CM | POA: Diagnosis present

## 2014-04-25 DIAGNOSIS — K219 Gastro-esophageal reflux disease without esophagitis: Secondary | ICD-10-CM | POA: Diagnosis present

## 2014-04-25 DIAGNOSIS — F3289 Other specified depressive episodes: Secondary | ICD-10-CM

## 2014-04-25 DIAGNOSIS — E1169 Type 2 diabetes mellitus with other specified complication: Secondary | ICD-10-CM

## 2014-04-25 DIAGNOSIS — R45851 Suicidal ideations: Secondary | ICD-10-CM

## 2014-04-25 DIAGNOSIS — G2581 Restless legs syndrome: Secondary | ICD-10-CM | POA: Diagnosis present

## 2014-04-25 DIAGNOSIS — J45909 Unspecified asthma, uncomplicated: Secondary | ICD-10-CM | POA: Diagnosis present

## 2014-04-25 DIAGNOSIS — F411 Generalized anxiety disorder: Secondary | ICD-10-CM | POA: Diagnosis present

## 2014-04-25 DIAGNOSIS — Z794 Long term (current) use of insulin: Secondary | ICD-10-CM

## 2014-04-25 DIAGNOSIS — F410 Panic disorder [episodic paroxysmal anxiety] without agoraphobia: Secondary | ICD-10-CM | POA: Diagnosis present

## 2014-04-25 DIAGNOSIS — F329 Major depressive disorder, single episode, unspecified: Secondary | ICD-10-CM

## 2014-04-25 DIAGNOSIS — G4700 Insomnia, unspecified: Secondary | ICD-10-CM | POA: Diagnosis present

## 2014-04-25 DIAGNOSIS — I10 Essential (primary) hypertension: Secondary | ICD-10-CM | POA: Diagnosis present

## 2014-04-25 DIAGNOSIS — F4310 Post-traumatic stress disorder, unspecified: Secondary | ICD-10-CM | POA: Diagnosis present

## 2014-04-25 LAB — GLUCOSE, CAPILLARY
GLUCOSE-CAPILLARY: 378 mg/dL — AB (ref 70–99)
Glucose-Capillary: 339 mg/dL — ABNORMAL HIGH (ref 70–99)
Glucose-Capillary: 412 mg/dL — ABNORMAL HIGH (ref 70–99)
Glucose-Capillary: 433 mg/dL — ABNORMAL HIGH (ref 70–99)

## 2014-04-25 LAB — CBG MONITORING, ED
GLUCOSE-CAPILLARY: 136 mg/dL — AB (ref 70–99)
GLUCOSE-CAPILLARY: 390 mg/dL — AB (ref 70–99)
Glucose-Capillary: 347 mg/dL — ABNORMAL HIGH (ref 70–99)

## 2014-04-25 MED ORDER — VENLAFAXINE HCL ER 75 MG PO CP24
75.0000 mg | ORAL_CAPSULE | Freq: Every morning | ORAL | Status: DC
  Administered 2014-04-26: 75 mg via ORAL
  Filled 2014-04-25 (×2): qty 1

## 2014-04-25 MED ORDER — INSULIN ASPART 100 UNIT/ML ~~LOC~~ SOLN
20.0000 [IU] | Freq: Once | SUBCUTANEOUS | Status: AC
  Administered 2014-04-25: 20 [IU] via SUBCUTANEOUS

## 2014-04-25 MED ORDER — MAGNESIUM HYDROXIDE 400 MG/5ML PO SUSP: 30.0000 mL | Freq: Every day | ORAL | Status: DC | PRN

## 2014-04-25 MED ORDER — MONTELUKAST SODIUM 10 MG PO TABS
10.0000 mg | ORAL_TABLET | Freq: Every day | ORAL | Status: DC
  Administered 2014-04-25 – 2014-05-02 (×8): 10 mg via ORAL
  Filled 2014-04-25 (×11): qty 1

## 2014-04-25 MED ORDER — OXYBUTYNIN CHLORIDE ER 10 MG PO TB24
10.0000 mg | ORAL_TABLET | Freq: Every day | ORAL | Status: DC
  Administered 2014-04-26 – 2014-05-03 (×8): 10 mg via ORAL
  Filled 2014-04-25 (×10): qty 1

## 2014-04-25 MED ORDER — INSULIN ASPART 100 UNIT/ML ~~LOC~~ SOLN
0.0000 [IU] | Freq: Every day | SUBCUTANEOUS | Status: DC
  Administered 2014-04-25: 5 [IU] via SUBCUTANEOUS

## 2014-04-25 MED ORDER — INSULIN ASPART 100 UNIT/ML ~~LOC~~ SOLN
3.0000 [IU] | Freq: Three times a day (TID) | SUBCUTANEOUS | Status: DC
  Administered 2014-04-25: 3 [IU] via SUBCUTANEOUS

## 2014-04-25 MED ORDER — NYSTATIN 100000 UNIT/GM EX CREA
1.0000 | TOPICAL_CREAM | Freq: Two times a day (BID) | CUTANEOUS | Status: DC | PRN
Start: 2014-04-25 — End: 2014-05-03
  Filled 2014-04-25: qty 15

## 2014-04-25 MED ORDER — PANTOPRAZOLE SODIUM 40 MG PO TBEC
80.0000 mg | DELAYED_RELEASE_TABLET | Freq: Every day | ORAL | Status: DC
  Administered 2014-04-26 – 2014-05-03 (×8): 80 mg via ORAL
  Filled 2014-04-25 (×10): qty 2

## 2014-04-25 MED ORDER — INSULIN ASPART 100 UNIT/ML ~~LOC~~ SOLN
0.0000 [IU] | Freq: Three times a day (TID) | SUBCUTANEOUS | Status: DC
  Administered 2014-04-26: 5 [IU] via SUBCUTANEOUS

## 2014-04-25 MED ORDER — LORATADINE 10 MG PO TABS
10.0000 mg | ORAL_TABLET | Freq: Every day | ORAL | Status: DC
  Administered 2014-04-26 – 2014-05-03 (×8): 10 mg via ORAL
  Filled 2014-04-25 (×11): qty 1

## 2014-04-25 MED ORDER — HYDROXYZINE HCL 25 MG PO TABS
25.0000 mg | ORAL_TABLET | Freq: Four times a day (QID) | ORAL | Status: DC | PRN
  Administered 2014-04-27: 25 mg via ORAL
  Filled 2014-04-25: qty 12
  Filled 2014-04-25: qty 1

## 2014-04-25 MED ORDER — ACETAMINOPHEN 325 MG PO TABS
650.0000 mg | ORAL_TABLET | Freq: Four times a day (QID) | ORAL | Status: DC | PRN
  Administered 2014-04-25 – 2014-05-02 (×10): 650 mg via ORAL
  Filled 2014-04-25 (×11): qty 2

## 2014-04-25 MED ORDER — NICOTINE 21 MG/24HR TD PT24
21.0000 mg | MEDICATED_PATCH | Freq: Every day | TRANSDERMAL | Status: DC
Start: 2014-04-26 — End: 2014-04-26
  Filled 2014-04-25 (×2): qty 1

## 2014-04-25 MED ORDER — INSULIN ASPART PROT & ASPART (70-30 MIX) 100 UNIT/ML ~~LOC~~ SUSP
18.0000 [IU] | Freq: Every day | SUBCUTANEOUS | Status: DC
  Administered 2014-04-25: 18 [IU] via SUBCUTANEOUS

## 2014-04-25 MED ORDER — ALBUTEROL SULFATE (2.5 MG/3ML) 0.083% IN NEBU
2.5000 mg | INHALATION_SOLUTION | Freq: Four times a day (QID) | RESPIRATORY_TRACT | Status: DC | PRN
  Administered 2014-04-27: 2.5 mg via RESPIRATORY_TRACT
  Filled 2014-04-25: qty 3

## 2014-04-25 MED ORDER — BUDESONIDE-FORMOTEROL FUMARATE 160-4.5 MCG/ACT IN AERO
2.0000 | INHALATION_SPRAY | Freq: Two times a day (BID) | RESPIRATORY_TRACT | Status: DC
  Administered 2014-04-25 – 2014-05-03 (×16): 2 via RESPIRATORY_TRACT
  Filled 2014-04-25 (×2): qty 6

## 2014-04-25 MED ORDER — ROPINIROLE HCL 1 MG PO TABS
1.0000 mg | ORAL_TABLET | Freq: Every day | ORAL | Status: DC
  Administered 2014-04-25 – 2014-05-02 (×8): 1 mg via ORAL
  Filled 2014-04-25 (×10): qty 1
  Filled 2014-04-25: qty 14

## 2014-04-25 MED ORDER — INSULIN ASPART 100 UNIT/ML ~~LOC~~ SOLN
0.0000 [IU] | Freq: Three times a day (TID) | SUBCUTANEOUS | Status: DC
  Administered 2014-04-25: 9 [IU] via SUBCUTANEOUS

## 2014-04-25 NOTE — ED Notes (Signed)
[facility] called.

## 2014-04-25 NOTE — Progress Notes (Signed)
RN called RT to come and set up patients home cpap. Water chamber is filled with sterile water for humidification. Patient is tolerating well. RT will monitor as tolerated.

## 2014-04-25 NOTE — ED Notes (Signed)
Bags wanded and put in locker number

## 2014-04-25 NOTE — [facility] Counselor (Addendum)
[facility]. Support paperwork signed and [facility]. [employer] placed in pt's chart.   Per Dr. Ladona Ridgelaylor and Nanine MeansJamison Lord NP, pt [facility] program [facility] the [facility] hall [facility].  Evette Cristalaroline Paige Sahiti Jos[place], [place] Assessment Counselor

## 2014-04-25 NOTE — ED Notes (Addendum)
Belongings in locker number 29
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
## 2014-04-25 NOTE — ED Notes (Signed)
Informed MD of CBG, Per Dr Effie ShyWentz, monitor patient and encourage a liter of oral fluids-will recheck CBG
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
## 2014-04-25 NOTE — Progress Notes (Signed)
CBG 412 this evening. NP notified of CBG and order to administer insulin as scheduled

## 2014-04-25 NOTE — ED Notes (Signed)
RT at bedside.

## 2014-04-25 NOTE — Progress Notes (Signed)
Pt attended karaoke group this evening. Pt participate and was appropriate in group.   

## 2014-04-25 NOTE — ED Notes (Signed)
[facility]  Pt is A&Ox 4, calm and cooperative at this time.  RT contacted to place pt on her C-pap.

## 2014-04-25 NOTE — Consult Note (Signed)
  Review of Systems  Constitutional: Negative.   HENT: Negative.   Eyes: Negative.   Respiratory: Negative.   Cardiovascular: Negative.   Gastrointestinal: Negative.   Genitourinary: Negative.   Musculoskeletal: Positive for myalgias.  Skin: Negative.   Neurological: Negative.   Endo/Heme/Allergies: Negative.   Psychiatric/Behavioral: Positive for depression and suicidal ideas.

## 2014-04-25 NOTE — Tx Team (Signed)
Initial Interdisciplinary Treatment Plan  PATIENT STRENGTHS: (choose at least two) Communication skills Supportive family/friends  PATIENT STRESSORS: Financial difficulties Health problems   PROBLEM LIST: Problem List/Patient Goals Date to be addressed Date deferred Reason deferred Estimated date of resolution  Depression 04/25/2014     Anxiety 04/25/2014     Health issues 04/25/2014     Poor support system 04/25/2014     Difficulty obtaining meds 04/25/2014                              DISCHARGE CRITERIA:  Ability to meet basic life and health needs Medical problems require only outpatient monitoring Reduction of life-threatening or endangering symptoms to within safe limits  PRELIMINARY DISCHARGE PLAN: Attend aftercare/continuing care group Return to previous living arrangement  PATIENT/FAMIILY INVOLVEMENT: This treatment plan has been presented to and reviewed with the patient, Isabel [name] Isabel [name], and/or family member.  The patient and family have been given the opportunity to ask questions and make suggestions.  Isabel [name] 04/25/2014, 4:26 PM
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
## 2014-04-26 ENCOUNTER — Encounter (HOSPITAL_COMMUNITY): Payer: Self-pay | Admitting: Psychiatry

## 2014-04-26 DIAGNOSIS — F4310 Post-traumatic stress disorder, unspecified: Secondary | ICD-10-CM | POA: Diagnosis present

## 2014-04-26 LAB — GLUCOSE, CAPILLARY
GLUCOSE-CAPILLARY: 426 mg/dL — AB (ref 70–99)
GLUCOSE-CAPILLARY: 490 mg/dL — AB (ref 70–99)
GLUCOSE-CAPILLARY: 166 mg/dL — AB (ref 70–99)
Glucose-Capillary: 243 mg/dL — ABNORMAL HIGH (ref 70–99)
Glucose-Capillary: 356 mg/dL — ABNORMAL HIGH (ref 70–99)
Glucose-Capillary: 420 mg/dL — ABNORMAL HIGH (ref 70–99)
Glucose-Capillary: 354 mg/dL — ABNORMAL HIGH (ref 70–99)

## 2014-04-26 LAB — HEMOGLOBIN A1C
Hgb A1c MFr Bld: 13.6 % — ABNORMAL HIGH (ref <5.7)
Mean Plasma Glucose: 344 mg/dL — ABNORMAL HIGH (ref <117)

## 2014-04-26 MED ORDER — DESVENLAFAXINE SUCCINATE ER 50 MG PO TB24
50.0000 mg | ORAL_TABLET | Freq: Every day | ORAL | Status: DC
  Administered 2014-04-26 – 2014-05-03 (×8): 50 mg via ORAL
  Filled 2014-04-26: qty 4
  Filled 2014-04-26 (×9): qty 1

## 2014-04-26 MED ORDER — INSULIN ASPART 100 UNIT/ML ~~LOC~~ SOLN
20.0000 [IU] | Freq: Once | SUBCUTANEOUS | Status: AC
  Administered 2014-04-26: 20 [IU] via SUBCUTANEOUS

## 2014-04-26 MED ORDER — INSULIN ASPART PROT & ASPART (70-30 MIX) 100 UNIT/ML ~~LOC~~ SUSP
18.0000 [IU] | Freq: Two times a day (BID) | SUBCUTANEOUS | Status: DC
  Administered 2014-04-26 – 2014-04-28 (×5): 18 [IU] via SUBCUTANEOUS

## 2014-04-26 MED ORDER — INSULIN ASPART 100 UNIT/ML ~~LOC~~ SOLN
15.0000 [IU] | Freq: Once | SUBCUTANEOUS | Status: AC
  Administered 2014-04-26: 15 [IU] via SUBCUTANEOUS

## 2014-04-26 MED ORDER — INSULIN ASPART 100 UNIT/ML ~~LOC~~ SOLN: 0.0000 [IU] | Freq: Three times a day (TID) | SUBCUTANEOUS | Status: DC

## 2014-04-26 MED ORDER — INSULIN ASPART 100 UNIT/ML ~~LOC~~ SOLN
4.0000 [IU] | Freq: Three times a day (TID) | SUBCUTANEOUS | Status: DC
  Administered 2014-04-26: 4 [IU] via SUBCUTANEOUS

## 2014-04-26 NOTE — Progress Notes (Signed)
D. Pt has been visible in milieu this evening, attended and participated in evening group session. Pt spoke about how she does not care to live in the situation she lives in. Pt spoke about how she lives with her cousin in the same apartment and feels that they are getting tired of one another. Pt did speak some about how she had troubles in the past and did make some suicide attempts. Pt is able to contract for safety on the unit and has received medications without incident. A. Support and encouragement provided, medication education given. R. Pt verbalized understanding, safety maintained.

## 2014-04-26 NOTE — Care Management Utilization Note (Signed)
Per State Regulation 482.30  The chart was reviewed for necessity with respect to the patient's Admission/ Duration of stay.Admit 04/25/14 SI, HI.  Next Review Date:04/28/24  Lacinda Axon, RN, BSN

## 2014-04-26 NOTE — Progress Notes (Signed)
Patient ID: Isabel [name], female   DOB: 1956-04-23, 58 y.o.   MRN: [id number] D: Patient in dayroom on approach. Pt reports agitated today because social worker from [facility] asked her if she feels safe here. Pt reports the comment made her angry. Pt report feeling anxious and depressed all day. Pt endorses homicidal ideation towards her cousin but laughs and said "I won't kill her I love snoopy" (nickname for her cousin). Pt reports she sees ghost. Cooperative with assessment. No acute distressed noted at this time.   A: Met with pt 1:1. Medications administered as prescribed. Writer encouraged pt to discuss feelings. Pt encouraged to come to staff with any question or concerns.   R: Patient remains safe. She is complaint with medications and denies any adverse reaction. Continue current POC.

## 2014-04-26 NOTE — Progress Notes (Signed)
Inpatient Diabetes Program Recommendations  AACE/ADA: New Consensus Statement on Inpatient Glycemic Control (2013)  Target Ranges:  Prepandial:   less than 140 mg/dL      Peak postprandial:   less than 180 mg/dL (1-2 hours)      Critically ill patients:  140 - 180 mg/dL      Results for AMINAT, MIGDAL ANN (MRN [id number]) as of 04/26/2014 19:34  Ref. Range 04/26/2014 06:15 04/26/2014 11:52 04/26/2014 13:15 04/26/2014 16:41 04/26/2014 17:14 04/26/2014 18:13  Glucose-Capillary Latest Range: 70-99 mg/dL 947 (H) 076 (H) 151 (H) 490 (H) 420 (H) 354 (H)    Results for KRYSTN, SOLANO ANN (MRN [id number]) as of 04/26/2014 19:34  Ref. Range 04/26/2014 06:16  Hemoglobin A1C Latest Range: <5.7 % 13.6 (H)     Reason for Assessment: Received referral for this patient.  Patient with extremely elevated CBGs  Diabetes history: Type 2 DM  Home DM Meds: 70/30 insulin- 18 units QHS   **Patient has only been receiving 18 units 70/30 insulin (with supper only) the last two days.  Note 70/30 insulin increased to 18 units bid with meals (breakfast and supper today).  Agree.  Unsure why patient is only on once a day 70/30 insulin QHS at home.  Patient likely needs bid dosing at home as well.  **Note that patient was started on Novolog 4 units tid with meals as meal coverage this evening.  Generally we do not recommend additional scheduled Novolog meal coverage while the patient is taking 70/30 insulin since 70/30 insulin has a built in meal coverage dose embedded within it.  SSI (correction scale) is OK to give with 70/30.   Recommend the following:  1) We need to see how patient does with bid dosing of 70/30 insulin tomorrow.  Patient will get 2 doses tomorrow.  From there we can decide how much to titrate the 70/30 insulin upward based on how much extra SSI the patient receives  2) Recommend d/c of Novolog 4 units tid with meals  3) Recommend increase of Novolog SSI to Resistant scale tid ac + HS   I can  review patient's CBGs late tomorrow evening (05/23) and help better determine how much more 70/30 insulin patient may need.   Will follow Ambrose Finland RN, MSN, CDE Diabetes Coordinator Inpatient Diabetes Program Team Pager: [phone] (8a-10p)
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
## 2014-04-26 NOTE — [facility] Suicide Risk Assessment (Signed)
[facility] INPATIENT:  Family/Significant Other Suicide Prevention Education  Suicide Prevention Education:  Education Completed; Ty Hilts (pt's cousin) has been identified by the patient as the family member/significant other with whom the patient will be residing, and identified as the person(s) who will aid the patient in the event of a mental health crisis (suicidal ideations/suicide attempt).  With written consent from the patient, the family member/significant other has been provided the following suicide prevention education, prior to the and/or following the discharge of the patient.  The suicide prevention education provided includes the following:  Suicide risk factors  Suicide prevention and interventions  National Suicide Hotline telephone number  [facility] assessment telephone number  [facility] Emergency Assistance 911  [facility] and/or Residential Mobile Crisis Unit telephone number  Request made of family/significant other to:  Remove weapons (e.g., guns, rifles, knives), all items previously/currently identified as safety concern.    Remove drugs/medications (over-the-counter, prescriptions, illicit drugs), all items previously/currently identified as a safety concern.  The family member/significant other verbalizes understanding of the suicide prevention education information provided.  The family member/significant other agrees to remove the items of safety concern listed above.  Garima Chronis Smart  LCSWA   04/26/2014, 1:03 PM

## 2014-04-26 NOTE — Progress Notes (Signed)
Patient ID: Isabel [name], female   DOB: [birth year], 58 y.o.   MRN: [id number]  D: Patient anxious on approach this am. Complaining about roommate this am keeping her up talking nonstop. Patient reports depression "8" on scale and hopelessness "0". Patient has passive SI but contracts. Patient hoping that she will feel better soon. Makes vague statements about feeling like choking various people but not agitated around others A: Staff will monitor on q 15 minute checks, follow treatment plan, and give meds as ordered. R: Cooperative on the unit
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
## 2014-04-26 NOTE — Progress Notes (Signed)
   Hyperglycemic Event  CBG: 490 at 1641  Treatment: 15 units of Novolog   Symptoms: None  Follow-up CBG: Time: 1714 hrs CBG Result:420  Possible Reasons for Event: Other: Meals on milieu  Comments/MD notified: Dr. Jannifer Franklin was obtained at 1655 hrs and Conrad obtained at 1716hrs and order given to rechecked in 30 minutes to an hour and cbg rechecked at 1813 hrs and it was 354    Isabel [name] M Isabel [name]

## 2014-04-26 NOTE — [facility] Group Notes (Signed)
[facility] LCSW Group Therapy  04/26/2014 2:33 PM  Type of Therapy:  Group Therapy  Participation Level:  Active  Participation Quality:  Attentive  Affect:  Appropriate  Cognitive:  Alert and Oriented  Insight:  Engaged  Engagement in Therapy:  Improving  Modes of Intervention:  Confrontation, Discussion, Education, Exploration, Problem-solving, Rapport Building, Socialization and Support  Summary of Progress/Problems: Feelings around Relapse. Group members discussed the meaning of relapse and shared personal stories of relapse, how it affected them and others, and how they perceived themselves during this time. Group members were encouraged to identify triggers, warning signs and coping skills used when facing the possibility of relapse. Social supports were discussed and explored in detail. Shardasia was attentive and engaged throughout today's therapy group. She shared that relapse can mean mental health deterioration in addition to substance abuse issues. Baraah shows progress in the group setting and improving insight AEB her ability to process how calling for help and reaching out to others potentially saved her life. Noraa shared that she plans to move to [facility] where there are more mental health resources and transportation is cheaper in order to prevent future mental illness relapse and keep busy by going to [facility] groups.    Tnia Anglada Smart LCSWA 04/26/2014, 2:33 PM

## 2014-04-26 NOTE — [facility] Group Notes (Signed)
[facility] LCSW Aftercare Discharge Planning Group Note   04/26/2014 10:10 AM  Participation Quality: Appropriate    Mood/Affect:  Depressed and Irritable  Depression Rating:  8  Anxiety Rating:  9  Thoughts of Suicide:  No Will you contract for safety?   NA  Current AVH:  No  Plan for Discharge/Comments:  Pt reports that she plans to return home with he cousin temporarilty and is wanting referral to [employer]. Pt will be given [place] resources being that she plans to eventually move to [place].   Transportation Means: unknown at this time.   Supports: cousin  Teaching laboratory technician
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
## 2014-04-26 NOTE — [facility] Suicide Risk Assessment (Signed)
Suicide Risk Assessment  Admission Assessment     Nursing information obtained from:    Demographic factors:    Current Mental Status:    Loss Factors:    Historical Factors:    Risk Reduction Factors:    Total Time spent with patient: 45 minutes  CLINICAL FACTORS:   Depression:   Impulsivity Insomnia Severe  PCOGNITIVE FEATURES THAT CONTRIBUTE TO RISK:  Closed-mindedness Polarized thinking Thought constriction (tunnel vision)    SUICIDE RISK:   Moderate:  Frequent suicidal ideation with limited intensity, and duration, some specificity in terms of plans, no associated intent, good self-control, limited dysphoria/symptomatology, some risk factors present, and identifiable protective factors, including available and accessible social support.  PLAN OF CARE: Supportive approach/coping skills/relapse prevention                               CBT/mindfulness                               Optimize treatment with psychotropics  I certify that inpatient services furnished can reasonably be expected to improve the patient's condition.  Rachael Fee 04/26/2014, 3:13 PM

## 2014-04-27 DIAGNOSIS — R45851 Suicidal ideations: Secondary | ICD-10-CM

## 2014-04-27 LAB — GLUCOSE, CAPILLARY
GLUCOSE-CAPILLARY: 409 mg/dL — AB (ref 70–99)
GLUCOSE-CAPILLARY: 431 mg/dL — AB (ref 70–99)
Glucose-Capillary: 328 mg/dL — ABNORMAL HIGH (ref 70–99)
Glucose-Capillary: 385 mg/dL — ABNORMAL HIGH (ref 70–99)
Glucose-Capillary: 279 mg/dL — ABNORMAL HIGH (ref 70–99)

## 2014-04-27 MED ORDER — INSULIN ASPART 100 UNIT/ML ~~LOC~~ SOLN
0.0000 [IU] | Freq: Three times a day (TID) | SUBCUTANEOUS | Status: DC
  Administered 2014-04-27 (×2): 20 [IU] via SUBCUTANEOUS
  Administered 2014-04-27: 15 [IU] via SUBCUTANEOUS
  Administered 2014-04-28 (×3): 11 [IU] via SUBCUTANEOUS
  Administered 2014-04-29: 20 [IU] via SUBCUTANEOUS
  Administered 2014-04-29: 15 [IU] via SUBCUTANEOUS
  Administered 2014-04-29: 2 [IU] via SUBCUTANEOUS
  Administered 2014-04-30 (×2): 4 [IU] via SUBCUTANEOUS
  Administered 2014-04-30: 7 [IU] via SUBCUTANEOUS
  Administered 2014-05-01: 3 [IU] via SUBCUTANEOUS
  Administered 2014-05-01: 11 [IU] via SUBCUTANEOUS
  Administered 2014-05-02: 3 [IU] via SUBCUTANEOUS
  Administered 2014-05-02: 20 [IU] via SUBCUTANEOUS
  Administered 2014-05-03: 4 [IU] via SUBCUTANEOUS

## 2014-04-27 MED ORDER — INSULIN ASPART 100 UNIT/ML ~~LOC~~ SOLN
0.0000 [IU] | Freq: Every day | SUBCUTANEOUS | Status: DC
  Administered 2014-04-27: 3 [IU] via SUBCUTANEOUS

## 2014-04-27 NOTE — Progress Notes (Signed)
[facility] MD Progress Note  04/27/2014 6:33 PM Kiyomi Matty  MRN:  [id number] Subjective:  Omer admits she got really upset after she received a visit from DSS who said that they received a call stating that she was in an unsafe situation. The DSS worker told her that she would have to be placed. Etosha has no idea who and why some one called. States she feels more and more that she does not have control over her life and this creates a lot of stress for her Diagnosis:   DSM5: Schizophrenia Disorders:  none Obsessive-Compulsive Disorders:  none Trauma-Stressor Disorders:  Posttraumatic Stress Disorder (309.81) Substance/Addictive Disorders:  none Depressive Disorders:  Major Depressive Disorder - Severe (296.23) Total Time spent with patient: 30 minutes  Axis I: Generalized Anxiety Disorder  ADL's:  Intact  Sleep: Fair  Appetite:  Fair  Suicidal Ideation:  Plan:  denies Intent:  denies Means:  denies Homicidal Ideation:  Plan:  denies Intent:  denies Means:  denies AEB (as evidenced by):  Psychiatric Specialty Exam: Physical Exam  Review of Systems  Constitutional: Negative.   HENT: Negative.   Eyes: Negative.   Respiratory: Negative.   Cardiovascular: Negative.   Gastrointestinal: Negative.   Genitourinary: Negative.   Musculoskeletal: Negative.   Skin: Negative.   Neurological: Negative.   Endo/Heme/Allergies: Negative.   Psychiatric/Behavioral: Positive for depression. The patient is nervous/anxious and has insomnia.     Blood pressure 115/79, pulse 80, temperature 97.9 F (36.6 C), temperature source Oral, resp. rate 18, height 5' 4.5" (1.638 m), weight 70.308 kg (155 lb).Body mass index is 26.2 kg/(m^2).  General Appearance: Fairly Groomed  Patent attorney::  Fair  Speech:  Clear and Coherent and not spontaneous  Volume:  Normal  Mood:  Anxious, Depressed and worried  Affect:  anxious, worried  Thought Process:  Coherent and Goal Directed  Orientation:  Full  (Time, Place, and Person)  Thought Content:  worries, concerns, fears  Suicidal Thoughts:  intermittent  Homicidal Thoughts:  No  Memory:  Immediate;   Fair Recent;   Fair Remote;   Fair  Judgement:  Fair  Insight:  Superficial  Psychomotor Activity:  Restlessness  Concentration:  Fair  Recall:  [employer] of Knowledge:NA  Language: Fair  Akathisia:  No  Handed:    AIMS (if indicated):     Assets:  Desire for Improvement  Sleep:  Number of Hours: 6.25   Musculoskeletal: Strength & Muscle Tone: within normal limits Gait & Station: normal Patient leans: N/A  Current Medications: Current Facility-Administered Medications  Medication Dose Route Frequency Provider Last Rate Last Dose  . acetaminophen (TYLENOL) tablet 650 mg  650 mg Oral Q6H PRN Sanjuana Kava, NP   650 mg at 04/27/14 1735  . albuterol (PROVENTIL) (2.5 MG/3ML) 0.083% nebulizer solution 2.5 mg  2.5 mg Nebulization Q6H PRN Sanjuana Kava, NP   2.5 mg at 04/27/14 1259  . budesonide-formoterol (SYMBICORT) 160-4.5 MCG/ACT inhaler 2 puff  2 puff Inhalation BID Sanjuana Kava, NP   2 puff at 04/27/14 1707  . desvenlafaxine (PRISTIQ) 24 hr tablet 50 mg  50 mg Oral Daily Rachael Fee, MD   50 mg at 04/27/14 0825  . hydrOXYzine (ATARAX/VISTARIL) tablet 25 mg  25 mg Oral Q6H PRN Sanjuana Kava, NP      . insulin aspart (novoLOG) injection 0-20 Units  0-20 Units Subcutaneous TID WC Kristeen Mans, NP   20 Units at 04/27/14 1710  .  insulin aspart (novoLOG) injection 0-5 Units  0-5 Units Subcutaneous QHS Kristeen Mans, NP      . insulin aspart protamine- aspart (NOVOLOG MIX 70/30) injection 18 Units  18 Units Subcutaneous BID WC Sanjuana Kava, NP   18 Units at 04/27/14 1710  . loratadine (CLARITIN) tablet 10 mg  10 mg Oral Daily Sanjuana Kava, NP   10 mg at 04/27/14 0825  . magnesium hydroxide (MILK OF MAGNESIA) suspension 30 mL  30 mL Oral Daily PRN Sanjuana Kava, NP      . montelukast (SINGULAIR) tablet 10 mg  10 mg Oral QHS Sanjuana Kava, NP   10 mg at 04/26/14 2153  . nystatin cream (MYCOSTATIN) 1 application  1 application Topical BID PRN Sanjuana Kava, NP      . oxybutynin (DITROPAN-XL) 24 hr tablet 10 mg  10 mg Oral Daily Sanjuana Kava, NP   10 mg at 04/27/14 0824  . pantoprazole (PROTONIX) EC tablet 80 mg  80 mg Oral Daily Sanjuana Kava, NP   80 mg at 04/27/14 0825  . rOPINIRole (REQUIP) tablet 1 mg  1 mg Oral QHS Sanjuana Kava, NP   1 mg at 04/26/14 2153    Lab Results:  Results for orders placed during the hospital encounter of 04/25/14 (from the past 48 hour(s))  GLUCOSE, CAPILLARY     Status: Abnormal   Collection Time    04/25/14  6:34 PM      Result Value Ref Range   Glucose-Capillary 433 (*) 70 - 99 mg/dL   Comment 1 Notify RN    GLUCOSE, CAPILLARY     Status: Abnormal   Collection Time    04/25/14  9:49 PM      Result Value Ref Range   Glucose-Capillary 378 (*) 70 - 99 mg/dL   Comment 1 Notify RN    GLUCOSE, CAPILLARY     Status: Abnormal   Collection Time    04/26/14  6:15 AM      Result Value Ref Range   Glucose-Capillary 243 (*) 70 - 99 mg/dL  HEMOGLOBIN Z6X     Status: Abnormal   Collection Time    04/26/14  6:16 AM      Result Value Ref Range   Hemoglobin A1C 13.6 (*) <5.7 %   Comment: (NOTE)                                                                               According to the ADA Clinical Practice Recommendations for 2011, when     HbA1c is used as a screening test:      >=6.5%   Diagnostic of Diabetes Mellitus               (if abnormal result is confirmed)     5.7-6.4%   Increased risk of developing Diabetes Mellitus     References:Diagnosis and Classification of Diabetes Mellitus,Diabetes     Care,2011,34(Suppl 1):S62-S69 and Standards of Medical Care in             Diabetes - 2011,Diabetes Care,2011,34 (Suppl 1):S11-S61.   Mean Plasma Glucose 344 (*) <117 mg/dL   Comment: Performed  at [facility]  GLUCOSE, CAPILLARY     Status: Abnormal   Collection Time     04/26/14 11:52 AM      Result Value Ref Range   Glucose-Capillary 426 (*) 70 - 99 mg/dL   Comment 1 Notify RN    GLUCOSE, CAPILLARY     Status: Abnormal   Collection Time    04/26/14  1:15 PM      Result Value Ref Range   Glucose-Capillary 356 (*) 70 - 99 mg/dL   Comment 1 Notify RN     Comment 2 Repeat Test    GLUCOSE, CAPILLARY     Status: Abnormal   Collection Time    04/26/14  4:41 PM      Result Value Ref Range   Glucose-Capillary 490 (*) 70 - 99 mg/dL   Comment 1 Notify RN    GLUCOSE, CAPILLARY     Status: Abnormal   Collection Time    04/26/14  5:14 PM      Result Value Ref Range   Glucose-Capillary 420 (*) 70 - 99 mg/dL   Comment 1 Notify RN    GLUCOSE, CAPILLARY     Status: Abnormal   Collection Time    04/26/14  6:13 PM      Result Value Ref Range   Glucose-Capillary 354 (*) 70 - 99 mg/dL  GLUCOSE, CAPILLARY     Status: Abnormal   Collection Time    04/26/14  9:38 PM      Result Value Ref Range   Glucose-Capillary 166 (*) 70 - 99 mg/dL  GLUCOSE, CAPILLARY     Status: Abnormal   Collection Time    04/27/14  6:13 AM      Result Value Ref Range   Glucose-Capillary 409 (*) 70 - 99 mg/dL  GLUCOSE, CAPILLARY     Status: Abnormal   Collection Time    04/27/14 11:22 AM      Result Value Ref Range   Glucose-Capillary 328 (*) 70 - 99 mg/dL  GLUCOSE, CAPILLARY     Status: Abnormal   Collection Time    04/27/14  4:12 PM      Result Value Ref Range   Glucose-Capillary 431 (*) 70 - 99 mg/dL    Physical Findings: AIMS: Facial and Oral Movements Muscles of Facial Expression: None, normal Lips and Perioral Area: None, normal Jaw: None, normal Tongue: None, normal,Extremity Movements Upper (arms, wrists, hands, fingers): None, normal Lower (legs, knees, ankles, toes): None, normal, Trunk Movements Neck, shoulders, hips: None, normal, Overall Severity Severity of abnormal movements (highest score from questions above): None, normal Incapacitation due to abnormal  movements: None, normal Patient's awareness of abnormal movements (rate only patient's report): No Awareness, Dental Status Current problems with teeth and/or dentures?: No Does patient usually wear dentures?: No  CIWA:    COWS:     Treatment Plan Summary: Daily contact with patient to assess and evaluate symptoms and progress in treatment Medication management  Plan: Supportive approach/coping skills           Consider increasing the Pristiq  Medical Decision Making Problem Points:  Review of psycho-social stressors (1) Data Points:  Review of medication regiment & side effects (2) Review of new medications or change in dosage (2)  I certify that inpatient services furnished can reasonably be expected to improve the patient's condition.   Rachael Feerving A Caress Reffitt 04/27/2014, 6:33 PM

## 2014-04-27 NOTE — Progress Notes (Signed)
D: pt stated she felt very depressed this morning. " i have the feeling to drink, and i haven't had a drink in years."  Pt stated she was feeling very anxious and agitated this morning. Passive si, no plan. Contracts for safety. Pt wrote on self inventory that she slept fair. Energy level is low. Rates depression 5/10 and hopelessness 6/10. C/o of pain 8/10 in head. Pt stated once d/c she is going, " take care of me!" pt is appropriate on unit. Engaging with others well.  A: scheduled medications given. q 15 min safety checks. Support and encouragement offered R: pt remains safe on unit. No signs of distress noted

## 2014-04-27 NOTE — Progress Notes (Signed)
Patient ID: Isabel [name], female   DOB: Jan 02, 1956, 58 y.o.   MRN: [id number]  Isabel Quin, RN called to notify of patient's morning CBG 409. Reviewed patient's CBG trend and recommendations by Orlinda Blalock, RN Diabetes Coordinator from 04/26/14.   Will implement the following changes:   1. Continue Novolog 70/30 18 units SQ BID with meals. 2. Will change Novolog SSI from Moderate to Resistant scale SQ TID AC and HS 3. Discontinue Novolog 4 units SQ TID with meals.   Alberteen Sam, FNP-BC Agree with assessment and plan Madie Reno A. Dub Mikes, M.D.

## 2014-04-27 NOTE — [facility] Group Notes (Signed)
[facility] Group Notes:  (Nursing/MHT/Case Management/Adjunct)  Date:  04/27/2014  Time:  9:46 AM  Type of Therapy:  self inventory  Participation Level:  Active  Participation Quality:  Appropriate  Affect:  Appropriate  Cognitive:  Appropriate  Insight:  Good  Engagement in Group:  Engaged  Modes of Intervention:  Discussion  Summary of Progress/Problems: feel really depressed and feel like drinking. Wants pristq increased to 100mg . Passive si   Kellie Moor 04/27/2014, 9:46 AM

## 2014-04-27 NOTE — Progress Notes (Signed)
Inpatient Diabetes Program Recommendations  AACE/ADA: New Consensus Statement on Inpatient Glycemic Control (2013)  Target Ranges:  Prepandial:   less than 140 mg/dL      Peak postprandial:   less than 180 mg/dL (1-2 hours)      Critically ill patients:  140 - 180 mg/dL      Results for VIRGIL, CRUTCHLEY ANN (MRN [id number]) as of 04/27/2014 22:17  Ref. Range 04/27/2014 06:13 04/27/2014 11:22 04/27/2014 16:12 04/27/2014 18:20 04/27/2014 21:24  Glucose-Capillary Latest Range: 70-99 mg/dL 469 (H) 629 (H) 528 (H) 385 (H) 279 (H)     Note patient received a total of 58 units of Novolog correction insulin (SSI) today per the ordered Resistant correction scale.  Patient also received both doses of her 70/30 insulin (18 units bid with meals).   Based on patient's insulin needs and CBG results today, recommend the following upward titration of 70/30 insulin:  Increase 70/30 insulin to 30 units bid with meals (breakfast and supper) [this would be equivalent to 0.6 units/kg basal insulin dosing]   Will follow Ambrose Finland RN, MSN, CDE Diabetes Coordinator Inpatient Diabetes Program Team Pager: [phone] (8a-10p)

## 2014-04-27 NOTE — Progress Notes (Signed)
Psychoeducational Group Note  Date:  04/27/2014 Time:  0945 am  Group Topic/Focus:  Identifying Needs:   The focus of this group is to help patients identify their personal needs that have been historically problematic and identify healthy behaviors to address their needs.  Participation Level:  Active  Participation Quality:  Appropriate  Affect:  Appropriate  Cognitive:  Appropriate  Insight:  Developing/Improving  Engagement in Group:  Developing/Improving  Additional Comments:    Andrena Mews 04/27/2014,4:04 PM

## 2014-04-27 NOTE — [facility] Group Notes (Signed)
[facility] Group Notes:  (Clinical Social Work)  04/27/2014     10-11AM  Summary of Progress/Problems:   The main focus of today's process group was for the patient to identify ways in which they have in the past sabotaged their own recovery. Motivational Interviewing and a worksheet were utilized to help patients explore in depth the perceived benefits and costs of their substance use, as well as the potential benefits and costs of stopping.  The Stages of Change were explained using a handout, with an emphasis on making plans to deal with sabotaging behaviors proactively.  The patient expressed that her self-sabotaging behavior is not sticking to what she is supposed to do for her diabetes, stating she is isolative and that ends up leading to suicidal ideations.  She stated she does not drink alcohol now, but used to.  She became loudly tearful when talking.  Type of Therapy:  Group Therapy - Process   Participation Level:  Active  Participation Quality:  Attentive and Sharing  Affect:  Flat and Tearful  Cognitive:  Oriented  Insight:  Limited  Engagement in Therapy:  Limited  Modes of Intervention:  Education, Support and Processing, Motivational Interviewing  Ambrose Mantle, LCSW 04/27/2014, 12:40 PM

## 2014-04-28 LAB — GLUCOSE, CAPILLARY
GLUCOSE-CAPILLARY: 285 mg/dL — AB (ref 70–99)
Glucose-Capillary: 253 mg/dL — ABNORMAL HIGH (ref 70–99)
Glucose-Capillary: 272 mg/dL — ABNORMAL HIGH (ref 70–99)
Glucose-Capillary: 288 mg/dL — ABNORMAL HIGH (ref 70–99)
Glucose-Capillary: 168 mg/dL — ABNORMAL HIGH (ref 70–99)

## 2014-04-28 MED ORDER — TRAZODONE HCL 50 MG PO TABS
50.0000 mg | ORAL_TABLET | Freq: Every evening | ORAL | Status: DC | PRN
  Administered 2014-04-28 – 2014-05-02 (×5): 50 mg via ORAL
  Filled 2014-04-28 (×3): qty 1
  Filled 2014-04-28: qty 4
  Filled 2014-04-28 (×2): qty 1

## 2014-04-28 MED ORDER — INSULIN ASPART PROT & ASPART (70-30 MIX) 100 UNIT/ML ~~LOC~~ SUSP
25.0000 [IU] | Freq: Two times a day (BID) | SUBCUTANEOUS | Status: DC
  Administered 2014-04-29 – 2014-05-02 (×8): 25 [IU] via SUBCUTANEOUS

## 2014-04-28 NOTE — [facility] Group Notes (Signed)
[facility] Group Notes:  (Nursing/MHT/Case Management/Adjunct)  Date:  04/28/2014  Time:  10:41 AM  Type of Therapy:  self inventory  Participation Level:  Active  Participation Quality:  Appropriate  Affect:  Appropriate  Cognitive:  Alert and Appropriate  Insight:  Good  Engagement in Group:  Engaged  Modes of Intervention:  Discussion  Summary of Progress/Problems: pt stated she did not have a good night, but today is better. Pt stated she is going to work on picking the right things to eat so her sugar will not be so high.   Kellie Moor 04/28/2014, 10:41 AM

## 2014-04-28 NOTE — Progress Notes (Signed)
Patient did attend the evening speaker AA meeting.  

## 2014-04-28 NOTE — Progress Notes (Signed)
[facility] MD Progress Note  04/28/2014 4:30 PM Isabel [name]  MRN:  [id number] Subjective:  Isabel [name] is still having a hard time. She did not sleep too well last night. States she is worrying about her situation. Her cousin told her that DSS said that she was not coming back to that house.   Diagnosis:   DSM5: Schizophrenia Disorders:  none Obsessive-Compulsive Disorders:  none Trauma-Stressor Disorders:  PTSD Substance/Addictive Disorders:  none Depressive Disorders:  Major Depressive Disorder - Moderate (296.22) Total Time spent with patient: 30 minutes  Axis I: Generalized Anxiety Disorder  ADL's:  Intact  Sleep: Poor  Appetite:  Fair  Suicidal Ideation:  Plan:  denies Intent:  denies Means:  denies Homicidal Ideation:  Plan:  denies Intent:  denies Means:  denies AEB (as evidenced by):  Psychiatric Specialty Exam: Physical Exam  Review of Systems  Constitutional: Positive for malaise/fatigue.  HENT: Negative.   Eyes: Negative.   Respiratory: Negative.   Cardiovascular: Negative.   Gastrointestinal: Negative.   Genitourinary: Negative.   Musculoskeletal: Negative.   Skin: Negative.   Neurological: Positive for weakness.  Endo/Heme/Allergies: Negative.   Psychiatric/Behavioral: Positive for depression. The patient is nervous/anxious and has insomnia.     Blood pressure 122/76, pulse 87, temperature 97 F (36.1 C), temperature source Oral, resp. rate 19, height 5' 4.5" (1.638 m), weight 70.308 kg (155 lb).Body mass index is 26.2 kg/(m^2).  General Appearance: Fairly Groomed  Patent attorney::  Fair  Speech:  Clear and Coherent  Volume:  Normal  Mood:  Anxious and worries  Affect:  anxious, worried  Thought Process:  Coherent and Goal Directed  Orientation:  Full (Time, Place, and Person)  Thought Content:  symtpoms, worries concerns  Suicidal Thoughts:  Intermittent thinking about DSS  Homicidal Thoughts:  No  Memory:  Immediate;   Fair Recent;   Fair Remote;    Fair  Judgement:  Fair  Insight:  Shallow  Psychomotor Activity:  Restlessness  Concentration:  Fair  Recall:  [employer] of Knowledge:NA  Language: Fair  Akathisia:  No  Handed:    AIMS (if indicated):     Assets:  Desire for Improvement  Sleep:  Number of Hours: 5.5   Musculoskeletal: Strength & Muscle Tone: within normal limits Gait & Station: normal Patient leans: N/A  Current Medications: Current Facility-Administered Medications  Medication Dose Route Frequency Provider Last Rate Last Dose  . acetaminophen (TYLENOL) tablet 650 mg  650 mg Oral Q6H PRN Sanjuana Kava, NP   650 mg at 04/28/14 0800  . albuterol (PROVENTIL) (2.5 MG/3ML) 0.083% nebulizer solution 2.5 mg  2.5 mg Nebulization Q6H PRN Sanjuana Kava, NP   2.5 mg at 04/27/14 1259  . budesonide-formoterol (SYMBICORT) 160-4.5 MCG/ACT inhaler 2 puff  2 puff Inhalation BID Sanjuana Kava, NP   2 puff at 04/28/14 6761  . desvenlafaxine (PRISTIQ) 24 hr tablet 50 mg  50 mg Oral Daily Rachael Fee, MD   50 mg at 04/28/14 9509  . hydrOXYzine (ATARAX/VISTARIL) tablet 25 mg  25 mg Oral Q6H PRN Sanjuana Kava, NP   25 mg at 04/27/14 2226  . insulin aspart (novoLOG) injection 0-20 Units  0-20 Units Subcutaneous TID WC Kristeen Mans, NP   11 Units at 04/28/14 1119  . insulin aspart (novoLOG) injection 0-5 Units  0-5 Units Subcutaneous QHS Kristeen Mans, NP   3 Units at 04/27/14 2127  . insulin aspart protamine- aspart (NOVOLOG MIX 70/30) injection 18  Units  18 Units Subcutaneous BID WC Sanjuana KavaAgnes I Nwoko, NP   18 Units at 04/28/14 [id number]  . loratadine (CLARITIN) tablet 10 mg  10 mg Oral Daily Sanjuana KavaAgnes I Nwoko, NP   10 mg at 04/28/14 [id number]  . magnesium hydroxide (MILK OF MAGNESIA) suspension 30 mL  30 mL Oral Daily PRN Sanjuana KavaAgnes I Nwoko, NP      . montelukast (SINGULAIR) tablet 10 mg  10 mg Oral QHS Sanjuana KavaAgnes I Nwoko, NP   10 mg at 04/27/14 2128  . nystatin cream (MYCOSTATIN) 1 application  1 application Topical BID PRN Sanjuana KavaAgnes I Nwoko, NP      . oxybutynin  (DITROPAN-XL) 24 hr tablet 10 mg  10 mg Oral Daily Sanjuana KavaAgnes I Nwoko, NP   10 mg at 04/28/14 [id number]  . pantoprazole (PROTONIX) EC tablet 80 mg  80 mg Oral Daily Sanjuana KavaAgnes I Nwoko, NP   80 mg at 04/28/14 [id number]  . rOPINIRole (REQUIP) tablet 1 mg  1 mg Oral QHS Sanjuana KavaAgnes I Nwoko, NP   1 mg at 04/27/14 2128    Lab Results:  Results for orders placed during the hospital encounter of 04/25/14 (from the past 48 hour(s))  GLUCOSE, CAPILLARY     Status: Abnormal   Collection Time    04/26/14  4:41 PM      Result Value Ref Range   Glucose-Capillary 490 (*) 70 - 99 mg/dL   Comment 1 Notify RN    GLUCOSE, CAPILLARY     Status: Abnormal   Collection Time    04/26/14  5:14 PM      Result Value Ref Range   Glucose-Capillary 420 (*) 70 - 99 mg/dL   Comment 1 Notify RN    GLUCOSE, CAPILLARY     Status: Abnormal   Collection Time    04/26/14  6:13 PM      Result Value Ref Range   Glucose-Capillary 354 (*) 70 - 99 mg/dL  GLUCOSE, CAPILLARY     Status: Abnormal   Collection Time    04/26/14  9:38 PM      Result Value Ref Range   Glucose-Capillary 166 (*) 70 - 99 mg/dL  GLUCOSE, CAPILLARY     Status: Abnormal   Collection Time    04/27/14  6:13 AM      Result Value Ref Range   Glucose-Capillary 409 (*) 70 - 99 mg/dL  GLUCOSE, CAPILLARY     Status: Abnormal   Collection Time    04/27/14 11:22 AM      Result Value Ref Range   Glucose-Capillary 328 (*) 70 - 99 mg/dL  GLUCOSE, CAPILLARY     Status: Abnormal   Collection Time    04/27/14  4:12 PM      Result Value Ref Range   Glucose-Capillary 431 (*) 70 - 99 mg/dL  GLUCOSE, CAPILLARY     Status: Abnormal   Collection Time    04/27/14  6:20 PM      Result Value Ref Range   Glucose-Capillary 385 (*) 70 - 99 mg/dL   Comment 1 Notify RN    GLUCOSE, CAPILLARY     Status: Abnormal   Collection Time    04/27/14  9:24 PM      Result Value Ref Range   Glucose-Capillary 279 (*) 70 - 99 mg/dL   Comment 1 Notify RN     Comment 2 Documented in Chart    GLUCOSE,  CAPILLARY     Status: Abnormal   Collection Time  04/28/14  5:57 AM      Result Value Ref Range   Glucose-Capillary 253 (*) 70 - 99 mg/dL   Comment 1 Notify RN    GLUCOSE, CAPILLARY     Status: Abnormal   Collection Time    04/28/14  8:53 AM      Result Value Ref Range   Glucose-Capillary 272 (*) 70 - 99 mg/dL  GLUCOSE, CAPILLARY     Status: Abnormal   Collection Time    04/28/14 11:10 AM      Result Value Ref Range   Glucose-Capillary 285 (*) 70 - 99 mg/dL    Physical Findings: AIMS: Facial and Oral Movements Muscles of Facial Expression: None, normal Lips and Perioral Area: None, normal Jaw: None, normal Tongue: None, normal,Extremity Movements Upper (arms, wrists, hands, fingers): None, normal Lower (legs, knees, ankles, toes): None, normal, Trunk Movements Neck, shoulders, hips: None, normal, Overall Severity Severity of abnormal movements (highest score from questions above): None, normal Incapacitation due to abnormal movements: None, normal Patient's awareness of abnormal movements (rate only patient's report): No Awareness, Dental Status Current problems with teeth and/or dentures?: No Does patient usually wear dentures?: No  CIWA:  CIWA-Ar Total: 0 COWS:     Treatment Plan Summary: Daily contact with patient to assess and evaluate symptoms and progress in treatment Medication management  Plan: Supportive approach/coping skills           CBT; mindfulness           Trazodone HS PRN sleep Medical Decision Making Problem Points:  Established problem, worsening (2) and Review of psycho-social stressors (1) Data Points:  Review of medication regiment & side effects (2)  I certify that inpatient services furnished can reasonably be expected to improve the patient's condition.   Rachael Fee 04/28/2014, 4:30 PM

## 2014-04-28 NOTE — Progress Notes (Signed)
[facility] Group Notes:  (Nursing/MHT/Case Management/Adjunct)  Date:  04/28/2014  Time:  12:37 AM  Type of Therapy:  AA  Participation Level:  Minimal  Participation Quality:  Appropriate  Affect:  Appropriate  Cognitive:  Appropriate  Insight:  Appropriate  Engagement in Group:  Engaged  Modes of Intervention:  Education  Summary of Progress/Problems: Pt. Attended group.  Sondra Come 04/28/2014, 12:37 AM

## 2014-04-28 NOTE — Progress Notes (Signed)
Patient ID: Isabel [name], female   DOB: April 01, 1956, 58 y.o.   MRN: [id number]  Patient's CBG trend reviewed. Based on the recommendations on 04/28/14 from [place], [place] Diabetes Coordinator the following changes will be made:  1. Continue Novolog SSI, Resistant Scale. 2. Increase Novolog 70/30 to 25 units SQ BID with meals (breakfast and supper)  Alberteen Sam, FNP-BC

## 2014-04-28 NOTE — Progress Notes (Signed)
Inpatient Diabetes Program Recommendations  AACE/ADA: New Consensus Statement on Inpatient Glycemic Control (2013)  Target Ranges:  Prepandial:   less than 140 mg/dL      Peak postprandial:   less than 180 mg/dL (1-2 hours)      Critically ill patients:  140 - 180 mg/dL    Results for DENNIE, SNOW ANN (MRN [id number]) as of 04/28/2014 17:39  Ref. Range 04/28/2014 05:57 04/28/2014 08:53 04/28/2014 11:10 04/28/2014 16:54  Glucose-Capillary Latest Range: 70-99 mg/dL 809 (H) 983 (H) 382 (H) 288 (H)       Note patient received a total of 33 units of Novolog correction insulin (SSI) so far today per the ordered Resistant correction scale. Patient also received both doses of her 70/30 insulin (18 units bid with meals).    Based on patient's insulin needs and CBG results today, recommend the following upward titration of 70/30 insulin:   Increase 70/30 insulin to 25 units bid with meals (breakfast and supper)  Patient will likely need bid dosing of 70/30 insulin when she is discharged home   Will follow Ambrose Finland RN, MSN, CDE Diabetes Coordinator Inpatient Diabetes Program Team Pager: [phone] (8a-10p)

## 2014-04-28 NOTE — [facility] Group Notes (Signed)
[facility] Group Notes:  (Clinical Social Work)  04/28/2014  10:00-11:00AM  Summary of Progress/Problems:   The main focus of today's process group was to discuss healthy supports that are needed versus unhealthy supports and what to do about them.  Multiple group members were quite negative and difficult to redirect, and it was necessary to remind the group several times of the Stages of Change discussed yesterday, including the fact that not everyone has decided to pursue sobriety.  CSW introduced a variety of possible supports to pursue and used scaling question (1 = least to 10 = most) to address each patient's willingness to "do something different to pursue sobriety."  Isabel [name] spoke little in group today, but listened attentively and afterward said "I was good today."  She said her willingness to do something different to pursue sobriety is "8" and would be higher if she finds other people who believe in her.  Type of Therapy:  Process Group with Motivational Interviewing  Participation Level:  Active  Participation Quality:  Attentive  Affect:  Blunted  Cognitive:  Oriented  Insight:  Developing/Improving  Engagement in Therapy:   Developing/Improving  Modes of Intervention:   Education, Support and Processing, Activity  Ambrose Mantle, LCSW 04/28/2014, 12:15pm

## 2014-04-28 NOTE — Progress Notes (Signed)
D: pt became upset this morning due to being told that she would not receive a snack until the designated time. Pt stated she was told by her crisis team that she was going to get special privileges due to her medical condition and she was feeling as though everyone else was being treated better than her. Writer spoke with pt and explained to pt why she was not receiving and extra snack and why her snack choices were limited. Pt acknowledge understanding. Passive SI, pt contracts for safety. Denies hi/avh. C/o HA 8/10. Pt rates depression and hopelessness 10/10. And stated that she slept poorly waking up due to nightmares. A: Tylenol given. Support and encouragement offered. 1:1 time given. scheduled medications given R: pt remains safe on unit. Sugar free snacks givnen. No signs of distress at this time.

## 2014-04-28 NOTE — Progress Notes (Signed)
Psychoeducational Group Note  Date:  04/28/2014 Time:  1315 pm  Group Topic/Focus:  Making Healthy Choices:   The focus of this group is to help patients identify negative/unhealthy choices they were using prior to admission and identify positive/healthier coping strategies to replace them upon discharge.  Participation Level:  Active  Participation Quality:  Appropriate and Attentive  Affect:  Appropriate  Cognitive:  Alert and Appropriate  Insight:  Engaged  Engagement in Group:  Engaged  Additional Comments:    Shyam Dawson J 04/28/2014,  

## 2014-04-28 NOTE — Progress Notes (Signed)
Patient ID: Isabel [name], female   DOB: Oct 02, 1956, 58 y.o.   MRN: [id number]  D: Patient pleasant but needing much redirection and attention on unit. Pt interrupting conversations with staff and intrusive.  A: Q 15 minute safety checks, encourage staff/peer interaction, and group participation. Administer medications as ordered by MD. R: Patient compliant with medications and group session. No s/s of distress noted during shift.

## 2014-04-29 LAB — GLUCOSE, CAPILLARY
GLUCOSE-CAPILLARY: 467 mg/dL — AB (ref 70–99)
GLUCOSE-CAPILLARY: 364 mg/dL — AB (ref 70–99)
GLUCOSE-CAPILLARY: 408 mg/dL — AB (ref 70–99)
GLUCOSE-CAPILLARY: 69 mg/dL — AB (ref 70–99)
GLUCOSE-CAPILLARY: 113 mg/dL — AB (ref 70–99)
Glucose-Capillary: 321 mg/dL — ABNORMAL HIGH (ref 70–99)
Glucose-Capillary: 176 mg/dL — ABNORMAL HIGH (ref 70–99)
Glucose-Capillary: 269 mg/dL — ABNORMAL HIGH (ref 70–99)
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
## 2014-04-29 NOTE — Progress Notes (Signed)
CBG of 408 obtained. Notified NP Tekia and received orders for 20 units of Novolog. Novolog given in patient's right arm. Will continue to monitor patient's CBG.

## 2014-04-29 NOTE — Progress Notes (Addendum)
Patient ID: Isabel [name], female   DOB: 1956-05-12, 58 y.o.   MRN: [id number] D: pt. Visible on unit, in dayroom interacting, but argumentative about diabetes. "I ain't been eating noting but yogurt" reports depression at "6" of 10, denies SHI, "well I'm all right now, I talk to my cousin" A: Clinical research associate encouraged pt. To monitor snacks avoid sweet, noted BS elevated today. Staff will monitor q70min for safety. R: Pt. Is safe on the unit, attended group.

## 2014-04-29 NOTE — Progress Notes (Signed)
D: Patient denies SI/HI and auditory and visual hallucinations. The patient has an anxious mood and affect. The patient denies any detox symptoms at this time. The patient is attending groups and interacting appropriately within the milieu.   A: Patient given emotional support from RN. Patient encouraged to come to staff with concerns and/or questions. Patient's medication routine continued. Patient's orders and plan of care reviewed.  R: Patient remains cooperative. Will continue to monitor patient q15 minutes for safety.

## 2014-04-29 NOTE — Progress Notes (Addendum)
Inpatient Diabetes Program Recommendations  AACE/ADA: New Consensus Statement on Inpatient Glycemic Control (2013)  Target Ranges:  Prepandial:   less than 140 mg/dL      Peak postprandial:   less than 180 mg/dL (1-2 hours)      Critically ill patients:  140 - 180 mg/dL   Reason for Visit: Hyperglycemia  Results for Isabel, [name] [facility] (MRN [id number]) as of 04/29/2014 18:41  Ref. Range 04/28/2014 05:57 04/28/2014 08:53 04/28/2014 11:10 04/28/2014 16:54 04/28/2014 21:15 04/29/2014 06:19 04/29/2014 11:57 04/29/2014 16:26 04/29/2014 16:52 04/29/2014 17:25 04/29/2014 18:24  Glucose-Capillary Latest Range: 70-99 mg/dL 914 (H) 782 (H) 956 (H) 288 (H) 168 (H) 321 (H) 176 (H) 467 (H) 408 (H) 364 (H) 269 (H)    Inpatient Diabetes Program Recommendations Insulin - Basal: Increase 70/30 to 30 units bid Encourage pt to make healthy choices using portion control in hospital cafeteria.  Will continue to follow. Thank you. Ailene Ards, RD, LDN, CDE Inpatient Diabetes Coordinator [phone]  Addendum - Ordered OP Diabetes Education consult at [facility] when pt is discharged.

## 2014-04-29 NOTE — Progress Notes (Signed)
Pt is awake and alert, pleasant and cooperative. Patient denies HI, SI, AH or VH. Interacts well with staff and peers. Pt is medication compliant. Will continue to monitor for safety.

## 2014-04-29 NOTE — Progress Notes (Signed)
CRITICAL VALUE ALERT  Critical value received:  408 CBG  Date of notification:  04/29/14  Time of notification:  1642  Critical value read back:yes  Nurse who received alert:  Kendell Bane, RN  MD/PA/NP notified:  Georgeanna Harrison, NP  Time MD/PA/NP responded:  4562  Responding MD/PA/NP:  Georgeanna Harrison, NP

## 2014-04-29 NOTE — Progress Notes (Signed)
[facility] MD Progress Note  04/29/2014 2:40 PM Isabel [name]  MRN:  [id number] Subjective:  Pt was interviewed, and chart reviewed. Pt reports still feeling depressed. Pt slept better last night with meds. Appetite good. Yesterday, pt had a fleeting urge to cut herself (but does not have anything that she could cut herself with here in the hospital). Pt reports her father served in WWII, so today [employer] Day) is always a difficult day for her. Pt feels like jumping the fence and running away somewhere at times. Pt denies current SIB/SI/HI/AVH. No current thoughts of cutting herself. Pt reports she will be safe in the hospital. She was requesting to have some books to read, because she is bored here (with just magazines). Pt reports tolerating meds well. Diagnosis:   DSM5: Schizophrenia Disorders:  none Obsessive-Compulsive Disorders:  none Trauma-Stressor Disorders:  PTSD Substance/Addictive Disorders:  none Depressive Disorders:  Major Depressive Disorder - Moderate (296.22) Total Time spent with patient: 30 minutes  Axis I: Generalized Anxiety Disorder  ADL's:  Intact  Sleep: Fair  Appetite:  Fair  Suicidal Ideation:  Plan:  denies Intent:  denies Means:  denies Homicidal Ideation:  Plan:  denies Intent:  denies Means:  denies AEB (as evidenced by):  Psychiatric Specialty Exam: Physical Exam  Review of Systems  Constitutional: Positive for malaise/fatigue.  HENT: Negative.   Eyes: Negative.   Respiratory: Negative.   Cardiovascular: Negative.   Gastrointestinal: Negative.   Genitourinary: Negative.   Musculoskeletal: Negative.   Skin: Negative.   Neurological: Positive for weakness.  Endo/Heme/Allergies: Negative.   Psychiatric/Behavioral: Positive for depression. The patient is nervous/anxious and has insomnia.     Blood pressure 128/89, pulse 92, temperature 97.9 F (36.6 C), temperature source Oral, resp. rate 16, height 5' 4.5" (1.638 m), weight 70.308 kg (155  lb).Body mass index is 26.2 kg/(m^2).  General Appearance: Fairly Groomed  Patent attorney::  Fair  Speech:  Clear and Coherent  Volume:  Normal  Mood:  Anxious and worries, depressed  Affect:  anxious, worried, depressed  Thought Process:  Coherent and Goal Directed  Orientation:  Full (Time, Place, and Person)  Thought Content:  symtpoms, worries concerns  Suicidal Thoughts:  Not today  Homicidal Thoughts:  No  Memory:  Immediate;   Fair Recent;   Fair Remote;   Fair  Judgement:  Fair  Insight:  Shallow  Psychomotor Activity:  Restlessness  Concentration:  Fair  Recall:  [employer] of Knowledge:NA  Language: Fair  Akathisia:  No  Handed:    AIMS (if indicated):     Assets:  Desire for Improvement  Sleep:  Number of Hours: 6   Musculoskeletal: Strength & Muscle Tone: within normal limits Gait & Station: normal Patient leans: N/A  Current Medications: Current Facility-Administered Medications  Medication Dose Route Frequency Provider Last Rate Last Dose  . acetaminophen (TYLENOL) tablet 650 mg  650 mg Oral Q6H PRN Sanjuana Kava, NP   650 mg at 04/29/14 0744  . albuterol (PROVENTIL) (2.5 MG/3ML) 0.083% nebulizer solution 2.5 mg  2.5 mg Nebulization Q6H PRN Sanjuana Kava, NP   2.5 mg at 04/27/14 1259  . budesonide-formoterol (SYMBICORT) 160-4.5 MCG/ACT inhaler 2 puff  2 puff Inhalation BID Sanjuana Kava, NP   2 puff at 04/29/14 0741  . desvenlafaxine (PRISTIQ) 24 hr tablet 50 mg  50 mg Oral Daily Rachael Fee, MD   50 mg at 04/29/14 0741  . hydrOXYzine (ATARAX/VISTARIL) tablet 25 mg  25  mg Oral Q6H PRN Sanjuana Kava, NP   25 mg at 04/27/14 2226  . insulin aspart (novoLOG) injection 0-20 Units  0-20 Units Subcutaneous TID WC Kristeen Mans, NP   2 Units at 04/29/14 1204  . insulin aspart (novoLOG) injection 0-5 Units  0-5 Units Subcutaneous QHS Kristeen Mans, NP   3 Units at 04/27/14 2127  . insulin aspart protamine- aspart (NOVOLOG MIX 70/30) injection 25 Units  25 Units  Subcutaneous BID WC Kristeen Mans, NP   25 Units at 04/29/14 0741  . loratadine (CLARITIN) tablet 10 mg  10 mg Oral Daily Sanjuana Kava, NP   10 mg at 04/29/14 0740  . magnesium hydroxide (MILK OF MAGNESIA) suspension 30 mL  30 mL Oral Daily PRN Sanjuana Kava, NP      . montelukast (SINGULAIR) tablet 10 mg  10 mg Oral QHS Sanjuana Kava, NP   10 mg at 04/28/14 2140  . nystatin cream (MYCOSTATIN) 1 application  1 application Topical BID PRN Sanjuana Kava, NP      . oxybutynin (DITROPAN-XL) 24 hr tablet 10 mg  10 mg Oral Daily Sanjuana Kava, NP   10 mg at 04/29/14 0740  . pantoprazole (PROTONIX) EC tablet 80 mg  80 mg Oral Daily Sanjuana Kava, NP   80 mg at 04/29/14 0740  . rOPINIRole (REQUIP) tablet 1 mg  1 mg Oral QHS Sanjuana Kava, NP   1 mg at 04/28/14 2136  . traZODone (DESYREL) tablet 50 mg  50 mg Oral QHS PRN Rachael Fee, MD   50 mg at 04/28/14 2138    Lab Results:  Results for orders placed during the hospital encounter of 04/25/14 (from the past 48 hour(s))  GLUCOSE, CAPILLARY     Status: Abnormal   Collection Time    04/27/14  4:12 PM      Result Value Ref Range   Glucose-Capillary 431 (*) 70 - 99 mg/dL  GLUCOSE, CAPILLARY     Status: Abnormal   Collection Time    04/27/14  6:20 PM      Result Value Ref Range   Glucose-Capillary 385 (*) 70 - 99 mg/dL   Comment 1 Notify RN    GLUCOSE, CAPILLARY     Status: Abnormal   Collection Time    04/27/14  9:24 PM      Result Value Ref Range   Glucose-Capillary 279 (*) 70 - 99 mg/dL   Comment 1 Notify RN     Comment 2 Documented in Chart    GLUCOSE, CAPILLARY     Status: Abnormal   Collection Time    04/28/14  5:57 AM      Result Value Ref Range   Glucose-Capillary 253 (*) 70 - 99 mg/dL   Comment 1 Notify RN    GLUCOSE, CAPILLARY     Status: Abnormal   Collection Time    04/28/14  8:53 AM      Result Value Ref Range   Glucose-Capillary 272 (*) 70 - 99 mg/dL  GLUCOSE, CAPILLARY     Status: Abnormal   Collection Time     04/28/14 11:10 AM      Result Value Ref Range   Glucose-Capillary 285 (*) 70 - 99 mg/dL  GLUCOSE, CAPILLARY     Status: Abnormal   Collection Time    04/28/14  4:54 PM      Result Value Ref Range   Glucose-Capillary 288 (*) 70 - 99  mg/dL   Comment 1 Notify RN    GLUCOSE, CAPILLARY     Status: Abnormal   Collection Time    04/28/14  9:15 PM      Result Value Ref Range   Glucose-Capillary 168 (*) 70 - 99 mg/dL   Comment 1 Notify RN     Comment 2 Documented in Chart    GLUCOSE, CAPILLARY     Status: Abnormal   Collection Time    04/29/14  6:19 AM      Result Value Ref Range   Glucose-Capillary 321 (*) 70 - 99 mg/dL   Comment 1 Notify RN     Comment 2 Documented in Chart    GLUCOSE, CAPILLARY     Status: Abnormal   Collection Time    04/29/14 11:57 AM      Result Value Ref Range   Glucose-Capillary 176 (*) 70 - 99 mg/dL    Physical Findings: AIMS: Facial and Oral Movements Muscles of Facial Expression: None, normal Lips and Perioral Area: None, normal Jaw: None, normal Tongue: None, normal,Extremity Movements Upper (arms, wrists, hands, fingers): None, normal Lower (legs, knees, ankles, toes): None, normal, Trunk Movements Neck, shoulders, hips: None, normal, Overall Severity Severity of abnormal movements (highest score from questions above): None, normal Incapacitation due to abnormal movements: None, normal Patient's awareness of abnormal movements (rate only patient's report): No Awareness, Dental Status Current problems with teeth and/or dentures?: No Does patient usually wear dentures?: No  CIWA:  CIWA-Ar Total: 0 COWS:     Treatment Plan Summary: Daily contact with patient to assess and evaluate symptoms and progress in treatment Medication management  Plan: Supportive approach/coping skills           CBT; mindfulness           Trazodone HS PRN sleep, since was effective last night. Medical Decision Making Problem Points:  Established problem, worsening (2)  and Review of psycho-social stressors (1) Data Points:  Review of medication regiment & side effects (2)  I certify that inpatient services furnished can reasonably be expected to improve the patient's condition.   Kingslee Mairena P Ethelene Closser 04/29/2014, 2:40 PM

## 2014-04-30 LAB — GLUCOSE, CAPILLARY
GLUCOSE-CAPILLARY: 184 mg/dL — AB (ref 70–99)
GLUCOSE-CAPILLARY: 79 mg/dL (ref 70–99)
Glucose-Capillary: 223 mg/dL — ABNORMAL HIGH (ref 70–99)
Glucose-Capillary: 160 mg/dL — ABNORMAL HIGH (ref 70–99)

## 2014-04-30 NOTE — Progress Notes (Signed)
[facility] - 0900  The focus of this group is to educate the patient on the purpose and policies of crisis stabilization and provide a format to answer questions about their admission.  The group details unit policies and expectations of patients while admitted. 

## 2014-04-30 NOTE — Progress Notes (Signed)
[facility] MD Progress Note  04/30/2014 6:36 PM Isabel [name] Isabel [name]  MRN:  [id number] Subjective:  Still very worried about what is going to happen once she gets out of here. Not sure what options does she have considering that DSS did not want her to go back to her cousin's. States she cant stop thinking about it. Feels completely hopeless, helpless. States that her cousin is he only support Diagnosis:   DSM5: Schizophrenia Disorders:  none Obsessive-Compulsive Disorders:  none Trauma-Stressor Disorders:  Posttraumatic Stress Disorder (309.81) Substance/Addictive Disorders:  none Depressive Disorders:  Major Depressive Disorder - Moderate (296.22) Total Time spent with patient: 30 minutes  Axis I: Generalized Anxiety Disorder  ADL's:  Intact  Sleep: Fair  Appetite:  Fair  Suicidal Ideation:  Plan:  denies Intent:  denies Means:  denies Homicidal Ideation:  Plan:  denies Intent:  denies Means:  denies AEB (as evidenced by):  Psychiatric Specialty Exam: Physical Exam  Review of Systems  Constitutional: Negative.   HENT: Negative.   Eyes: Negative.   Respiratory: Negative.   Cardiovascular: Negative.   Gastrointestinal: Negative.   Genitourinary: Negative.   Musculoskeletal: Negative.   Skin: Negative.   Neurological: Negative.   Endo/Heme/Allergies: Negative.   Psychiatric/Behavioral: Positive for depression. The patient is nervous/anxious.     Blood pressure 132/87, pulse 76, temperature 97.4 F (36.3 C), temperature source Oral, resp. rate 18, height 5' 4.5" (1.638 m), weight 70.308 kg (155 lb).Body mass index is 26.2 kg/(m^2).  General Appearance: Fairly Groomed  Patent attorneyye Contact::  Fair  Speech:  Clear and Coherent  Volume:  Decreased  Mood:  Anxious and worried  Affect:  anxious, worried  Thought Process:  Coherent and Goal Directed  Orientation:  Full (Time, Place, and Person)  Thought Content:  symtpoms events worries concerns  Suicidal Thoughts:  No  Homicidal  Thoughts:  No  Memory:  Immediate;   Fair Recent;   Fair Remote;   Fair  Judgement:  Fair  Insight:  Present and Shallow  Psychomotor Activity:  Restlessness  Concentration:  Fair  Recall:  [employer]Fair  Fund of Knowledge:NA  Language: Fair  Akathisia:  No  Handed:    AIMS (if indicated):     Assets:  Desire for Improvement  Sleep:  Number of Hours: 5.25   Musculoskeletal: Strength & Muscle Tone: within normal limits Gait & Station: normal Patient leans: N/A  Current Medications: Current Facility-Administered Medications  Medication Dose Route Frequency Provider Last Rate Last Dose  . acetaminophen (TYLENOL) tablet 650 mg  650 mg Oral Q6H PRN Sanjuana KavaAgnes I Nwoko, NP   650 mg at 04/30/14 1435  . albuterol (PROVENTIL) (2.5 MG/3ML) 0.083% nebulizer solution 2.5 mg  2.5 mg Nebulization Q6H PRN Sanjuana KavaAgnes I Nwoko, NP   2.5 mg at 04/27/14 1259  . budesonide-formoterol (SYMBICORT) 160-4.5 MCG/ACT inhaler 2 puff  2 puff Inhalation BID Sanjuana KavaAgnes I Nwoko, NP   2 puff at 04/30/14 1700  . desvenlafaxine (PRISTIQ) 24 hr tablet 50 mg  50 mg Oral Daily Rachael FeeIrving A Izea Livolsi, MD   50 mg at 04/30/14 0748  . hydrOXYzine (ATARAX/VISTARIL) tablet 25 mg  25 mg Oral Q6H PRN Sanjuana KavaAgnes I Nwoko, NP   25 mg at 04/27/14 2226  . insulin aspart (novoLOG) injection 0-20 Units  0-20 Units Subcutaneous TID WC Kristeen MansFran E Hobson, NP   4 Units at 04/30/14 1700  . insulin aspart (novoLOG) injection 0-5 Units  0-5 Units Subcutaneous QHS Kristeen MansFran E Hobson, NP   3 Units at 04/27/14  2127  . insulin aspart protamine- aspart (NOVOLOG MIX 70/30) injection 25 Units  25 Units Subcutaneous BID WC Kristeen Mans, NP   25 Units at 04/30/14 1659  . loratadine (CLARITIN) tablet 10 mg  10 mg Oral Daily Sanjuana Kava, NP   10 mg at 04/30/14 0748  . magnesium hydroxide (MILK OF MAGNESIA) suspension 30 mL  30 mL Oral Daily PRN Sanjuana Kava, NP      . montelukast (SINGULAIR) tablet 10 mg  10 mg Oral QHS Sanjuana Kava, NP   10 mg at 04/29/14 2210  . nystatin cream (MYCOSTATIN)  1 application  1 application Topical BID PRN Sanjuana Kava, NP      . oxybutynin (DITROPAN-XL) 24 hr tablet 10 mg  10 mg Oral Daily Sanjuana Kava, NP   10 mg at 04/30/14 0749  . pantoprazole (PROTONIX) EC tablet 80 mg  80 mg Oral Daily Sanjuana Kava, NP   80 mg at 04/30/14 0748  . rOPINIRole (REQUIP) tablet 1 mg  1 mg Oral QHS Sanjuana Kava, NP   1 mg at 04/29/14 2211  . traZODone (DESYREL) tablet 50 mg  50 mg Oral QHS PRN Rachael Fee, MD   50 mg at 04/29/14 2212    Lab Results:  Results for orders placed during the hospital encounter of 04/25/14 (from the past 48 hour(s))  GLUCOSE, CAPILLARY     Status: Abnormal   Collection Time    04/28/14  9:15 PM      Result Value Ref Range   Glucose-Capillary 168 (*) 70 - 99 mg/dL   Comment 1 Notify RN     Comment 2 Documented in Chart    GLUCOSE, CAPILLARY     Status: Abnormal   Collection Time    04/29/14  6:19 AM      Result Value Ref Range   Glucose-Capillary 321 (*) 70 - 99 mg/dL   Comment 1 Notify RN     Comment 2 Documented in Chart    GLUCOSE, CAPILLARY     Status: Abnormal   Collection Time    04/29/14 11:57 AM      Result Value Ref Range   Glucose-Capillary 176 (*) 70 - 99 mg/dL  GLUCOSE, CAPILLARY     Status: Abnormal   Collection Time    04/29/14  4:26 PM      Result Value Ref Range   Glucose-Capillary 467 (*) 70 - 99 mg/dL  GLUCOSE, CAPILLARY     Status: Abnormal   Collection Time    04/29/14  4:52 PM      Result Value Ref Range   Glucose-Capillary 408 (*) 70 - 99 mg/dL  GLUCOSE, CAPILLARY     Status: Abnormal   Collection Time    04/29/14  5:25 PM      Result Value Ref Range   Glucose-Capillary 364 (*) 70 - 99 mg/dL  GLUCOSE, CAPILLARY     Status: Abnormal   Collection Time    04/29/14  6:24 PM      Result Value Ref Range   Glucose-Capillary 269 (*) 70 - 99 mg/dL  GLUCOSE, CAPILLARY     Status: Abnormal   Collection Time    04/29/14  9:29 PM      Result Value Ref Range   Glucose-Capillary 69 (*) 70 - 99 mg/dL   GLUCOSE, CAPILLARY     Status: Abnormal   Collection Time    04/29/14 10:38 PM  Result Value Ref Range   Glucose-Capillary 113 (*) 70 - 99 mg/dL  GLUCOSE, CAPILLARY     Status: Abnormal   Collection Time    04/30/14  6:17 AM      Result Value Ref Range   Glucose-Capillary 223 (*) 70 - 99 mg/dL  GLUCOSE, CAPILLARY     Status: Abnormal   Collection Time    04/30/14 11:56 AM      Result Value Ref Range   Glucose-Capillary 160 (*) 70 - 99 mg/dL  GLUCOSE, CAPILLARY     Status: Abnormal   Collection Time    04/30/14  4:58 PM      Result Value Ref Range   Glucose-Capillary 184 (*) 70 - 99 mg/dL    Physical Findings: AIMS: Facial and Oral Movements Muscles of Facial Expression: None, normal Lips and Perioral Area: None, normal Jaw: None, normal Tongue: None, normal,Extremity Movements Upper (arms, wrists, hands, fingers): None, normal Lower (legs, knees, ankles, toes): None, normal, Trunk Movements Neck, shoulders, hips: None, normal, Overall Severity Severity of abnormal movements (highest score from questions above): None, normal Incapacitation due to abnormal movements: None, normal Patient's awareness of abnormal movements (rate only patient's report): No Awareness, Dental Status Current problems with teeth and/or dentures?: No Does patient usually wear dentures?: No  CIWA:  CIWA-Ar Total: 0 COWS:     Treatment Plan Summary: Daily contact with patient to assess and evaluate symptoms and progress in treatment Medication management  Plan: Supportive approach/coping skills           Help to deal with current crisis  Medical Decision Making Problem Points:  Review of psycho-social stressors (1) Data Points:  Review of medication regiment & side effects (2)  I certify that inpatient services furnished can reasonably be expected to improve the patient's condition.   Rachael Fee 04/30/2014, 6:36 PM

## 2014-04-30 NOTE — [facility] Group Notes (Signed)
[facility] LCSW Group Therapy  04/30/2014 1:34 PM  Type of Therapy:  Group Therapy  Participation Level:  Active  Participation Quality:  Attentive  Affect:  Depressed and Flat  Cognitive:  Oriented  Insight:  Improving  Engagement in Therapy:  Improving  Modes of Intervention:  Confrontation, Discussion, Education, Exploration, Problem-solving, Rapport Building, Socialization and Support  Summary of Progress/Problems: MHA Speaker came to talk about his personal journey with substance abuse and addiction. Isabel [name] processed ways by which to relate to the speaker. MHA speaker provided handouts and educational information pertaining to groups and services offered by the [facility].    Ghazi Rumpf Smart LCSWA 04/30/2014, 1:34 PM

## 2014-04-30 NOTE — Progress Notes (Signed)
D: Patient denies SI/HI and auditory and visual hallucinations. The patient has an anxious mood and affect. The patient is not reporting any detox symptoms at this time. The patient is attending all groups and interacting appropriately within the milieu.  A: Patient given emotional support from RN. Patient encouraged to come to staff with concerns and/or questions. Patient's medication routine continued. Patient's orders and plan of care reviewed. Patient was given education regarding blood sugar and appropriate choice of food.  R: Patient remains appropriate and states that she is working on choosing healthier food choices to help with her CBGs. Will continue to monitor patient q15 minutes for safety.

## 2014-04-30 NOTE — Progress Notes (Signed)
Recreation Therapy Notes  Animal-Assisted Activity/Therapy (AAA/T) Program Checklist/Progress Notes Patient Eligibility Criteria Checklist & Daily Group note for Rec Tx Intervention  Date: 05.26.2015 Time: 2:45pm Location: [address] Dayroom    AAA/T Program Assumption of Risk Form signed by Patient/ or Parent Legal Guardian yes  Patient is free of allergies or sever asthma yes  Patient reports no fear of animals yes  Patient reports no history of cruelty to animals yes   Patient understands his/her participation is voluntary yes  Patient washes hands before animal contact yes  Patient washes hands after animal contact yes  Behavioral Response: Appropriate.  Education: Hand Washing, Appropriate Animal Interaction   Education Outcome: Acknowledges understanding  Clinical Observations/Feedback: Patient interacted appropriately in AAA session, petting therapy dog appropriately and interaction with peers appropriately.   Isabel [name] L [name], LRT/CTRS  Isabel [name] L [name] 04/30/2014 4:45 PM 

## 2014-05-01 DIAGNOSIS — F411 Generalized anxiety disorder: Secondary | ICD-10-CM

## 2014-05-01 LAB — GLUCOSE, CAPILLARY
GLUCOSE-CAPILLARY: 122 mg/dL — AB (ref 70–99)
GLUCOSE-CAPILLARY: 67 mg/dL — AB (ref 70–99)
Glucose-Capillary: 78 mg/dL (ref 70–99)
Glucose-Capillary: 281 mg/dL — ABNORMAL HIGH (ref 70–99)
Glucose-Capillary: 55 mg/dL — ABNORMAL LOW (ref 70–99)
Glucose-Capillary: 89 mg/dL (ref 70–99)

## 2014-05-01 NOTE — Tx Team (Signed)
Interdisciplinary Treatment Plan Update (Adult)  Date: 05/01/2014   Time Reviewed: 11:56 AM  Progress in Treatment:  Attending groups: Yes  Participating in groups: Yes    Taking medication as prescribed: Yes  Tolerating medication: Yes  Family/Significant othe contact made: SPE completed with pt's cousin/roomate, Trish.    Patient understands diagnosis: Yes, AEB seeking treatment for mood stabilization and medication management.  Discussing patient identified problems/goals with staff: Yes  Medical problems stabilized or resolved: Yes  Denies suicidal/homicidal ideation: Yes during group/self report.  Patient has not harmed self or Others: Yes  New problem(s) identified:  Discharge Plan or Barriers: Pt plans to return home temporarily and follow up at [facility]. She plans to move to [facility] in next few months due to availability of transportation and mental health resources. CSW provided pt with several resources in [place] area.  Additional comments:n/a  Reason for Continuation of Hospitalization: Medication management  Mood stabilization Estimated length of stay: 1-2 days  For review of initial/current patient goals, please see plan of care.  Attendees:  Patient:    Family:    Physician: Geoffery Lyons MD 05/01/2014 11:56 AM   Nursing: Griffin Dakin RN  05/01/2014 11:56 AM   Clinical Social Worker Yu Cragun Smart, LCSWA  05/01/2014 11:56 AM   Other: Darden Dates Nurse CM  05/01/2014 11:56 AM   Other:    Other: Aggie N. PA  05/01/2014 11:56 AM   Other:    Scribe for Treatment Team:  [employer] LCSWA 05/01/2014 11:56 AM

## 2014-05-01 NOTE — [facility] Group Notes (Signed)
[facility] LCSW Group Therapy  05/01/2014 3:12 PM  Type of Therapy:  Group Therapy  Participation Level:  Active  Participation Quality:  Attentive  Affect:  Appropriate  Cognitive:  Alert and Oriented  Insight:  Improving  Engagement in Therapy:  Improving  Modes of Intervention:  Confrontation, Discussion, Education, Exploration, Problem-solving, Rapport Building, Socialization and Support  Summary of Progress/Problems: Today's Topic: Overcoming Obstacles. Pt identified obstacles faced currently and processed barriers involved in overcoming these obstacles. Pt identified steps necessary for overcoming these obstacles and explored motivation (internal and external) for facing these difficulties head on. Pt further identified one area of concern in their lives and chose a skill of focus pulled from their "toolbox." Mirola was attentive and engaged throughout today's therapy group. She shared that she is worried about getting home when she d/c's as she does not have anyone that can pick her up. She shows progress in the group setting and improving insight AEB her ability to brainstorm with the group and CSW to figure out transportation issues. "Maybe I can take a cab if I have to."    [employer] LCSWA 05/01/2014, 3:12 PM

## 2014-05-01 NOTE — Progress Notes (Signed)
D Pt. Denies SI and HI, no complaints of pain or discomfort noted.    A Writer offered support and encouragement.,  Discussed medication and food intake with pt. As well as discharge plans.  R Pt. Remains safe on the unit,  PT.'s CBG was high 281 and pt. Admitted she had eaten sweets due to being low earlier in the day.  Pt. Received 11 units of novolog and 25 units of 70/30 prior to dinner.  Pt. Walked away as Clinical research associate attempted an assessment, states she will be returning to stay with her Sister when she is discharged from [facility].

## 2014-05-01 NOTE — Progress Notes (Signed)
Patient ID: Isabel [name], female   DOB: 1956-01-21, 58 y.o.   MRN: [id number] She has been up and about and to all the groups interacting with peers and staff. Has requested and received a prn  for headache today that was effective. Here CBG at 12 noon was 78 and her novologe insulin was [name]. Self inventory:  Depression and hopelessness at 5, denies wwithdrawal symptoms  Continues to have SI thoughts off and on.

## 2014-05-01 NOTE — [facility] Group Notes (Signed)
[facility] LCSW Aftercare Discharge Planning Group Note   05/01/2014 10:16 AM  Participation Quality:  Appropriate   Mood/Affect:  Appropriate  Depression Rating:  8  Anxiety Rating:  8  Thoughts of Suicide:  No Will you contract for safety?   NA  Current AVH:  No  Plan for Discharge/Comments:  Pt reports that she still feels depressed and needs more time with [place]. Pt plans to return home to [place] temporarily until she can find permanent housing in [place] area. She plans to follow up at [facility].   Transportation Means: unknown   Supports: Equities trader

## 2014-05-01 NOTE — Progress Notes (Signed)
Patient ID: Isabel [name] Isabel [name], female   DOB: October 07, 1956, 58 y.o.   MRN: [id number] [facility]  05/01/2014 1:27 PM Isabel [name] Isabel Gilder  MRN:  [id number]  Subjective:  Ms. [name] continue to endorse depressed mood. She says she is not sleeping well due to bad nightmares. Saw some children being killed by hitler. She states that she is very depressed because she does not have much to do with her time. Adds that she reads a lot, play cards and enjoys word puzzles just to keep busy. She plans to start going to church after discharge.  Diagnosis:   DSM5: Schizophrenia Disorders:  none Obsessive-Compulsive Disorders:  none Trauma-Stressor Disorders:  Posttraumatic Stress Disorder (309.81) Substance/Addictive Disorders:  none Depressive Disorders:  Major Depressive Disorder - Moderate (296.22) Total Time spent with patient: 30 minutes  Axis I: Generalized Anxiety Disorder  ADL's:  Intact  Sleep: Fair  Appetite:  Fair  Suicidal Ideation:  Plan:  denies Intent:  denies Means:  denies Homicidal Ideation:  Plan:  denies Intent:  denies Means:  denies AEB (as evidenced by):  Psychiatric Specialty Exam: Physical Exam  Review of Systems  Constitutional: Negative.   HENT: Negative.   Eyes: Negative.   Respiratory: Negative.   Cardiovascular: Negative.   Gastrointestinal: Negative.   Genitourinary: Negative.   Musculoskeletal: Negative.   Skin: Negative.   Neurological: Negative.   Endo/Heme/Allergies: Negative.   Psychiatric/Behavioral: Positive for depression. The patient is nervous/anxious.     Blood pressure 119/83, pulse 69, temperature 97.7 F (36.5 C), temperature source Oral, resp. rate 18, height 5' 4.5" (1.638 m), weight 70.308 kg (155 lb).Body mass index is 26.2 kg/(m^2).  General Appearance: Fairly Groomed  Patent attorneyye Contact::  Fair  Speech:  Clear and Coherent  Volume:  Decreased  Mood:  Anxious and worried  Affect:  anxious, worried  Thought Process:  Coherent  and Goal Directed  Orientation:  Full (Time, Place, and Person)  Thought Content:  symtpoms events worries concerns  Suicidal Thoughts:  No  Homicidal Thoughts:  No  Memory:  Immediate;   Fair Recent;   Fair Remote;   Fair  Judgement:  Fair  Insight:  Present and Shallow  Psychomotor Activity:  Restlessness  Concentration:  Fair  Recall:  [employer]Fair  Fund of Knowledge:NA  Language: Fair  Akathisia:  No  Handed:    AIMS (if indicated):     Assets:  Desire for Improvement  Sleep:  Number of Hours: 5.75   Musculoskeletal: Strength & Muscle Tone: within normal limits Gait & Station: normal Patient leans: N/A  Current Medications: Current Facility-Administered Medications  Medication Dose Route Frequency Provider Last Rate Last Dose  . acetaminophen (TYLENOL) tablet 650 mg  650 mg Oral Q6H PRN Sanjuana KavaAgnes I Nwoko, NP   650 mg at 05/01/14 1205  . albuterol (PROVENTIL) (2.5 MG/3ML) 0.083% nebulizer solution 2.5 mg  2.5 mg Nebulization Q6H PRN Sanjuana KavaAgnes I Nwoko, NP   2.5 mg at 04/27/14 1259  . budesonide-formoterol (SYMBICORT) 160-4.5 MCG/ACT inhaler 2 puff  2 puff Inhalation BID Sanjuana KavaAgnes I Nwoko, NP   2 puff at 05/01/14 0800  . desvenlafaxine (PRISTIQ) 24 hr tablet 50 mg  50 mg Oral Daily Rachael FeeIrving A Skip Litke, MD   50 mg at 05/01/14 0758  . hydrOXYzine (ATARAX/VISTARIL) tablet 25 mg  25 mg Oral Q6H PRN Sanjuana KavaAgnes I Nwoko, NP   25 mg at 04/27/14 2226  . insulin aspart (novoLOG) injection 0-20 Units  0-20 Units Subcutaneous TID WC Drenda FreezeFran E  Link Snuffer, NP   3 Units at 05/01/14 0620  . insulin aspart (novoLOG) injection 0-5 Units  0-5 Units Subcutaneous QHS Kristeen Mans, NP   3 Units at 04/27/14 2127  . insulin aspart protamine- aspart (NOVOLOG MIX 70/30) injection 25 Units  25 Units Subcutaneous BID WC Kristeen Mans, NP   25 Units at 05/01/14 0755  . loratadine (CLARITIN) tablet 10 mg  10 mg Oral Daily Sanjuana Kava, NP   10 mg at 05/01/14 0758  . magnesium hydroxide (MILK OF MAGNESIA) suspension 30 mL  30 mL Oral Daily PRN  Sanjuana Kava, NP      . montelukast (SINGULAIR) tablet 10 mg  10 mg Oral QHS Sanjuana Kava, NP   10 mg at 04/30/14 2141  . nystatin cream (MYCOSTATIN) 1 application  1 application Topical BID PRN Sanjuana Kava, NP      . oxybutynin (DITROPAN-XL) 24 hr tablet 10 mg  10 mg Oral Daily Sanjuana Kava, NP   10 mg at 05/01/14 0758  . pantoprazole (PROTONIX) EC tablet 80 mg  80 mg Oral Daily Sanjuana Kava, NP   80 mg at 05/01/14 0758  . rOPINIRole (REQUIP) tablet 1 mg  1 mg Oral QHS Sanjuana Kava, NP   1 mg at 04/30/14 2141  . traZODone (DESYREL) tablet 50 mg  50 mg Oral QHS PRN Rachael Fee, MD   50 mg at 04/30/14 2144    Lab Results:  Results for orders placed during the hospital encounter of 04/25/14 (from the past 48 hour(s))  GLUCOSE, CAPILLARY     Status: Abnormal   Collection Time    04/29/14  4:26 PM      Result Value Ref Range   Glucose-Capillary 467 (*) 70 - 99 mg/dL  GLUCOSE, CAPILLARY     Status: Abnormal   Collection Time    04/29/14  4:52 PM      Result Value Ref Range   Glucose-Capillary 408 (*) 70 - 99 mg/dL  GLUCOSE, CAPILLARY     Status: Abnormal   Collection Time    04/29/14  5:25 PM      Result Value Ref Range   Glucose-Capillary 364 (*) 70 - 99 mg/dL  GLUCOSE, CAPILLARY     Status: Abnormal   Collection Time    04/29/14  6:24 PM      Result Value Ref Range   Glucose-Capillary 269 (*) 70 - 99 mg/dL  GLUCOSE, CAPILLARY     Status: Abnormal   Collection Time    04/29/14  9:29 PM      Result Value Ref Range   Glucose-Capillary 69 (*) 70 - 99 mg/dL  GLUCOSE, CAPILLARY     Status: Abnormal   Collection Time    04/29/14 10:38 PM      Result Value Ref Range   Glucose-Capillary 113 (*) 70 - 99 mg/dL  GLUCOSE, CAPILLARY     Status: Abnormal   Collection Time    04/30/14  6:17 AM      Result Value Ref Range   Glucose-Capillary 223 (*) 70 - 99 mg/dL  GLUCOSE, CAPILLARY     Status: Abnormal   Collection Time    04/30/14 11:56 AM      Result Value Ref Range    Glucose-Capillary 160 (*) 70 - 99 mg/dL  GLUCOSE, CAPILLARY     Status: Abnormal   Collection Time    04/30/14  4:58 PM      Result  Value Ref Range   Glucose-Capillary 184 (*) 70 - 99 mg/dL  GLUCOSE, CAPILLARY     Status: None   Collection Time    04/30/14  9:19 PM      Result Value Ref Range   Glucose-Capillary 79  70 - 99 mg/dL  GLUCOSE, CAPILLARY     Status: Abnormal   Collection Time    05/01/14  5:46 AM      Result Value Ref Range   Glucose-Capillary 122 (*) 70 - 99 mg/dL   Comment 1 Notify RN    GLUCOSE, CAPILLARY     Status: None   Collection Time    05/01/14 12:00 PM      Result Value Ref Range   Glucose-Capillary 78  70 - 99 mg/dL    Physical Findings: AIMS: Facial and Oral Movements Muscles of Facial Expression: None, normal Lips and Perioral Area: None, normal Jaw: None, normal Tongue: None, normal,Extremity Movements Upper (arms, wrists, hands, fingers): None, normal Lower (legs, knees, ankles, toes): None, normal, Trunk Movements Neck, shoulders, hips: None, normal, Overall Severity Severity of abnormal movements (highest score from questions above): None, normal Incapacitation due to abnormal movements: None, normal Patient's awareness of abnormal movements (rate only patient's report): No Awareness, Dental Status Current problems with teeth and/or dentures?: No Does patient usually wear dentures?: No  CIWA:  CIWA-Ar Total: 0 COWS:     Treatment Plan Summary: Daily contact with patient to assess and evaluate symptoms and progress in treatment Medication management  Plan: Supportive approach/coping skills. Initiate Minipress 1 mg Q bedtime for nightmares. Help to deal with current crisis.  Medical Decision Making Problem Points:  Review of psycho-social stressors (1) Data Points:  Review of medication regiment & side effects (2)  I certify that inpatient services furnished can reasonably be expected to improve the patient's condition.   Sanjuana Kava, PMHNP-BC 05/01/2014, 1:27 PM Personally evaluated the patient formulated the treatment plan Madie Reno A. Dub Mikes, M.D.

## 2014-05-01 NOTE — Progress Notes (Signed)
  D: Pt observed sleeping in bed with eyes closed. RR even and unlabored. No distress noted  .  A: Q 15 minute checks were done for safety.  R: safety maintained on unit.  

## 2014-05-01 NOTE — Progress Notes (Signed)
Patient ID: Isabel [name], female   DOB: 18-Oct-1956, 58 y.o.   MRN: [id number] PER STATE REGULATIONS 482.30  THIS CHART WAS REVIEWED FOR MEDICAL NECESSITY WITH RESPECT TO THE PATIENT'S ADMISSION/ DURATION OF STAY.  NEXT REVIEW DATE: 05/02/2014  Willa Rough, RN, BSN CASE MANAGER

## 2014-05-02 LAB — GLUCOSE, CAPILLARY
GLUCOSE-CAPILLARY: 78 mg/dL (ref 70–99)
GLUCOSE-CAPILLARY: 192 mg/dL — AB (ref 70–99)
Glucose-Capillary: 136 mg/dL — ABNORMAL HIGH (ref 70–99)
Glucose-Capillary: 401 mg/dL — ABNORMAL HIGH (ref 70–99)
Glucose-Capillary: 72 mg/dL (ref 70–99)

## 2014-05-02 MED ORDER — INSULIN ASPART PROT & ASPART (70-30 MIX) 100 UNIT/ML ~~LOC~~ SUSP
30.0000 [IU] | Freq: Two times a day (BID) | SUBCUTANEOUS | Status: DC
  Administered 2014-05-03: 30 [IU] via SUBCUTANEOUS

## 2014-05-02 NOTE — Progress Notes (Signed)
Pt attended karaoke group this evening. Pt was appropriate and participate in group.    

## 2014-05-02 NOTE — Progress Notes (Signed)
Patient ID: Isabel [name], female   DOB: 05/30/1956, 58 y.o.   MRN: [id number]  Patient's CBG trend reviewed. Based on the recommendations on 04/29/14 from [place], [place] Diabetes Coordinator the following changes will be made:  1. Increase Novolog 70/30 to 30 units SQ BID with meals (breakfast and supper)    Alberteen Sam, FNP-BC Agree with assessment and plan Madie Reno A. [place], [place].D

## 2014-05-02 NOTE — Progress Notes (Signed)
Adult Psychoeducational Group Note  Date:  05/02/2014 Time:  12:15 AM  Group Topic/Focus:  NA group  Participation Level:  Active  Participation Quality:  Appropriate  Affect:  Appropriate  Cognitive:  Alert  Insight: Appropriate  Engagement in Group:  Engaged   Modes of Intervention:  Discussion  Additional Comments:  Pt attended NA group this evening.   Flonnie Hailstone 05/02/2014, 12:15 AM

## 2014-05-02 NOTE — Clinical Social Work Note (Signed)
CSW spoke with pt's cousin. She requested that CSW sit in with pt while she cancel services with [facility]. Marchelle Folks [place]) [phone]. Pt wants to return home at d/c tomorrow (no transportation at this time) and follow up at [facility] until she decides to move to [place]. CSW left message with Salome Spotted ([facility] case worker) to discuss pt aftercare plan.   [employer], LCSWA 05/02/2014 8:34 AM

## 2014-05-02 NOTE — Progress Notes (Signed)
D. CBG-192   Pt. Interacting with peers and ambulating the hallway.  A.  Continue to monitor pt. And continued to encourage pt. To make good food choices.  R.  Pt. Receptive.

## 2014-05-02 NOTE — [facility] Group Notes (Signed)
[facility] LCSW Group Therapy  05/02/2014 2:02 PM  Type of Therapy:  Group Therapy  Participation Level:  Active  Participation Quality:  Attentive  Affect:  Appropriate  Cognitive:  Alert and Oriented  Insight:  Engaged  Engagement in Therapy:  Engaged  Modes of Intervention:  Discussion, Education, Exploration, Limit-setting, Problem-solving, Rapport Building, Socialization and Support  Summary of Progress/Problems: Emotion Regulation: This group focused on both positive and negative emotion identification and allowed group members to process ways to identify feelings, regulate negative emotions, and find healthy ways to manage internal/external emotions. Group members were asked to reflect on a time when their reaction to an emotion led to a negative outcome and explored how alternative responses using emotion regulation would have benefited them. Group members were also asked to discuss a time when emotion regulation was utilized when a negative emotion was experienced. Isabel [name] shared that she struggles with regulating her anger and tends to react without thinking through her actions. Isabel [name] shows progress in the group setting and improving insight AEB her ability to process how "going for a walk, walking away from those situations, and going to read my book" will help her get time to deescalate. "then I can go back and talk out whatever the problem is." Isabel [name] shared that she and her cousin have been talking about how to better handle problems with each other in a constructive way. Isabel [name] shared that her cousin is a positive support for her and that they have been helping each other to appts and with following up with doctors. Isabel [name] was pleasant and calm during group.        Yonathan Perrow Smart LCSWA  05/02/2014, 2:02 PM

## 2014-05-02 NOTE — Clinical Social Work Note (Signed)
CSW and pt spoke with Marchelle Folks at [facility]. Pt cancelled services with this provider due to dissatisfaction with care. Pt wants to follow up at [facility] until her move to [place]. Pt interested in [facility] [employer] groups and stated that she will work on getting transportation secured to attend groups at [facility] in [place]. Pt plans to return home Friday [facility]) and still cannot find transportation home. CSW assessing.  [employer], LCSWA  05/02/2014 10:22 AM

## 2014-05-02 NOTE — Progress Notes (Signed)
Adult Psychoeducational Group Note  Date:  05/02/2014 Time:  9:00 AM  Group Topic/Focus:  Morning Wellness Group  Participation Level:  Active  Participation Quality:  Appropriate  Affect:  Appropriate  Cognitive:  Alert and Oriented  Insight: Appropriate  Engagement in Group:  Engaged  Modes of Intervention:  Discussion  Additional Comments: Patient's goal is to discharge on tomorrow and to make all her follow-up appointments.  Melanee Spry 05/02/2014, 10:04 AM

## 2014-05-02 NOTE — Progress Notes (Signed)
Patient ID: Isabel [name], female   DOB: [birth year], 58 y.o.   MRN: [id number] D: Pt. In bed eyes closed, respirations even. A: Writer observed for s/s of distress. Staff will monitor q64min for safety. R: Pt. Is safe on the unit, no distress noted.

## 2014-05-02 NOTE — Progress Notes (Signed)
Patient ID: Isabel [name], female   DOB: Dec 04, 1956, 58 y.o.   MRN: [id number] PER STATE REGULATIONS 482.30  THIS CHART WAS REVIEWED FOR MEDICAL NECESSITY WITH RESPECT TO THE PATIENT'S ADMISSION/ DURATION OF STAY.  NEXT REVIEW DATE: 04/05/2014  Willa Rough, RN, BSN CASE MANAGER

## 2014-05-02 NOTE — Progress Notes (Signed)
Patient ID: Isabel [name], female   DOB: Apr 18, 1956, 58 y.o.   MRN: [id number] She has been up and about and to the groups interacting with peers and staff,. Self inventory: depression 7 up from 5, hopelessness 7 up from 5 yesterday, denies withdrawal symptoms and SI thoughts. Her CBG was 78 at lunch novo log was [name]. She affect is much brighter.laughing interacting more with peers and staff.

## 2014-05-02 NOTE — Progress Notes (Signed)
[facility] MD Progress Note  05/02/2014 5:57 PM Isabel [name]  MRN:  [id number] Subjective:  States that even if she is allowed to go back with her cousin, she would want to eventually move to [place]. States that she is not getting the services that she has been promised by [facility]. She states she would like to be in a day program. States that she knows that if she goes back to the house and has no interactions nothing to do she will get more depressed end up here again Diagnosis:   DSM5: Schizophrenia Disorders:  none Obsessive-Compulsive Disorders:  none Trauma-Stressor Disorders:  Posttraumatic Stress Disorder (309.81) Substance/Addictive Disorders:  none Depressive Disorders:  Major Depressive Disorder - Moderate (296.22) Total Time spent with patient: 30 minutes  Axis I: Generalized Anxiety Disorder  ADL's:  Intact  Sleep: Fair  Appetite:  Fair  Suicidal Ideation:  Plan:  denies Intent:  denies Means:  denies Homicidal Ideation:  Plan:  denies Intent:  denies Means:  denies AEB (as evidenced by):  Psychiatric Specialty Exam: Physical Exam  Review of Systems  Constitutional: Negative.   HENT: Negative.   Eyes: Negative.   Respiratory: Negative.   Cardiovascular: Negative.   Gastrointestinal: Negative.   Genitourinary: Negative.   Musculoskeletal: Negative.   Skin: Negative.   Neurological: Negative.   Endo/Heme/Allergies: Negative.   Psychiatric/Behavioral: Positive for depression. The patient is nervous/anxious.     Blood pressure 122/83, pulse 82, temperature 97.7 F (36.5 C), temperature source Oral, resp. rate 16, height 5' 4.5" (1.638 m), weight 70.308 kg (155 lb).Body mass index is 26.2 kg/(m^2).  General Appearance: Fairly Groomed  Patent attorney::  Fair  Speech:  Clear and Coherent  Volume:  fluctuates  Mood:  Anxious, Depressed and worried  Affect:  anxious, worried  Thought Process:  Coherent and Goal Directed  Orientation:  Full (Time, Place,  and Person)  Thought Content:  worries, concerns  Suicidal Thoughts:  No  Homicidal Thoughts:  No  Memory:  Immediate;   Fair Recent;   Fair Remote;   Fair  Judgement:  Fair  Insight:  Present and Shallow  Psychomotor Activity:  Restlessness  Concentration:  Fair  Recall:  [employer] of Knowledge:NA  Language: Fair  Akathisia:  No  Handed:    AIMS (if indicated):     Assets:  Desire for Improvement  Sleep:  Number of Hours: 6.75   Musculoskeletal: Strength & Muscle Tone: within normal limits Gait & Station: normal Patient leans: N/A  Current Medications: Current Facility-Administered Medications  Medication Dose Route Frequency Provider Last Rate Last Dose  . acetaminophen (TYLENOL) tablet 650 mg  650 mg Oral Q6H PRN Sanjuana Kava, NP   650 mg at 05/02/14 1547  . albuterol (PROVENTIL) (2.5 MG/3ML) 0.083% nebulizer solution 2.5 mg  2.5 mg Nebulization Q6H PRN Sanjuana Kava, NP   2.5 mg at 04/27/14 1259  . budesonide-formoterol (SYMBICORT) 160-4.5 MCG/ACT inhaler 2 puff  2 puff Inhalation BID Sanjuana Kava, NP   2 puff at 05/02/14 1646  . desvenlafaxine (PRISTIQ) 24 hr tablet 50 mg  50 mg Oral Daily Rachael Fee, MD   50 mg at 05/02/14 0747  . hydrOXYzine (ATARAX/VISTARIL) tablet 25 mg  25 mg Oral Q6H PRN Sanjuana Kava, NP   25 mg at 04/27/14 2226  . insulin aspart (novoLOG) injection 0-20 Units  0-20 Units Subcutaneous TID WC Kristeen Mans, NP   20 Units at 05/02/14 1652  .  insulin aspart (novoLOG) injection 0-5 Units  0-5 Units Subcutaneous QHS Kristeen MansFran E Hobson, NP   3 Units at 04/27/14 2127  . insulin aspart protamine- aspart (NOVOLOG MIX 70/30) injection 25 Units  25 Units Subcutaneous BID WC Kristeen MansFran E Hobson, NP   25 Units at 05/02/14 1649  . loratadine (CLARITIN) tablet 10 mg  10 mg Oral Daily Sanjuana KavaAgnes I Nwoko, NP   10 mg at 05/02/14 0747  . magnesium hydroxide (MILK OF MAGNESIA) suspension 30 mL  30 mL Oral Daily PRN Sanjuana KavaAgnes I Nwoko, NP      . montelukast (SINGULAIR) tablet 10 mg  10  mg Oral QHS Sanjuana KavaAgnes I Nwoko, NP   10 mg at 05/01/14 2138  . nystatin cream (MYCOSTATIN) 1 application  1 application Topical BID PRN Sanjuana KavaAgnes I Nwoko, NP      . oxybutynin (DITROPAN-XL) 24 hr tablet 10 mg  10 mg Oral Daily Sanjuana KavaAgnes I Nwoko, NP   10 mg at 05/02/14 0747  . pantoprazole (PROTONIX) EC tablet 80 mg  80 mg Oral Daily Sanjuana KavaAgnes I Nwoko, NP   80 mg at 05/02/14 0747  . rOPINIRole (REQUIP) tablet 1 mg  1 mg Oral QHS Sanjuana KavaAgnes I Nwoko, NP   1 mg at 05/01/14 2138  . traZODone (DESYREL) tablet 50 mg  50 mg Oral QHS PRN Rachael FeeIrving A Fahed Morten, MD   50 mg at 05/01/14 2138    Lab Results:  Results for orders placed during the hospital encounter of 04/25/14 (from the past 48 hour(s))  GLUCOSE, CAPILLARY     Status: None   Collection Time    04/30/14  9:19 PM      Result Value Ref Range   Glucose-Capillary 79  70 - 99 mg/dL  GLUCOSE, CAPILLARY     Status: Abnormal   Collection Time    05/01/14  5:46 AM      Result Value Ref Range   Glucose-Capillary 122 (*) 70 - 99 mg/dL   Comment 1 Notify RN    GLUCOSE, CAPILLARY     Status: None   Collection Time    05/01/14 12:00 PM      Result Value Ref Range   Glucose-Capillary 78  70 - 99 mg/dL  GLUCOSE, CAPILLARY     Status: Abnormal   Collection Time    05/01/14  4:53 PM      Result Value Ref Range   Glucose-Capillary 281 (*) 70 - 99 mg/dL  GLUCOSE, CAPILLARY     Status: Abnormal   Collection Time    05/01/14  9:10 PM      Result Value Ref Range   Glucose-Capillary 55 (*) 70 - 99 mg/dL  GLUCOSE, CAPILLARY     Status: Abnormal   Collection Time    05/01/14  9:19 PM      Result Value Ref Range   Glucose-Capillary 67 (*) 70 - 99 mg/dL  GLUCOSE, CAPILLARY     Status: None   Collection Time    05/01/14  9:31 PM      Result Value Ref Range   Glucose-Capillary 89  70 - 99 mg/dL  GLUCOSE, CAPILLARY     Status: Abnormal   Collection Time    05/02/14  6:03 AM      Result Value Ref Range   Glucose-Capillary 136 (*) 70 - 99 mg/dL  GLUCOSE, CAPILLARY     Status:  None   Collection Time    05/02/14 11:50 AM      Result Value Ref Range  Glucose-Capillary 78  70 - 99 mg/dL   Comment 1 Notify RN    GLUCOSE, CAPILLARY     Status: Abnormal   Collection Time    05/02/14  4:30 PM      Result Value Ref Range   Glucose-Capillary 401 (*) 70 - 99 mg/dL   Comment 1 Notify RN      Physical Findings: AIMS: Facial and Oral Movements Muscles of Facial Expression: None, normal Lips and Perioral Area: None, normal Jaw: None, normal Tongue: None, normal,Extremity Movements Upper (arms, wrists, hands, fingers): None, normal Lower (legs, knees, ankles, toes): None, normal, Trunk Movements Neck, shoulders, hips: None, normal, Overall Severity Severity of abnormal movements (highest score from questions above): None, normal Incapacitation due to abnormal movements: None, normal Patient's awareness of abnormal movements (rate only patient's report): No Awareness, Dental Status Current problems with teeth and/or dentures?: No Does patient usually wear dentures?: No  CIWA:  CIWA-Ar Total: 0 COWS:     Treatment Plan Summary: Daily contact with patient to assess and evaluate symptoms and progress in treatment Medication management  Plan: Supportive approach/coping skills/CBT, mindfulness           Behavioral activation/life style changes to better manage her mood  Medical Decision Making Problem Points:  Review of psycho-social stressors (1) Data Points:  Review of medication regiment & side effects (2)  I certify that inpatient services furnished can reasonably be expected to improve the patient's condition.   Rachael Fee 05/02/2014, 5:57 PM

## 2014-05-02 NOTE — Progress Notes (Signed)
Pt.s CBG-401.    Pt. Reports that she ate [place] and french fries for Lunch.   A.   Dr. Dub Mikes notified and pt. Given given 25 units of  scheduled Novolog Mix 70/30 and Novolog 20 units per sliding scale per Dr. Dub Mikes.  R.  Pt. Encouraged to make better choices for supper.  Pt. Did not report any symptoms.

## 2014-05-02 NOTE — Clinical Social Work Note (Addendum)
CSW and pt met individually. Pt cannot find transportation home. CSW contacted pt's only support (cousin/roomate) Trish. Trish shared that she does not drive and has no way to get pt home. Pt was originally transported to ED by [employer]. Per Vidal Schwalbe, we can provide taxi voucher for pt for tomorrow's d/c. $54 estimate [facility]). [address] [place], [place].   [place], [place]  05/02/2014 2:34 PM

## 2014-05-03 DIAGNOSIS — F322 Major depressive disorder, single episode, severe without psychotic features: Secondary | ICD-10-CM

## 2014-05-03 DIAGNOSIS — F4310 Post-traumatic stress disorder, unspecified: Secondary | ICD-10-CM

## 2014-05-03 LAB — GLUCOSE, CAPILLARY
GLUCOSE-CAPILLARY: 158 mg/dL — AB (ref 70–99)
Glucose-Capillary: 56 mg/dL — ABNORMAL LOW (ref 70–99)
Glucose-Capillary: 136 mg/dL — ABNORMAL HIGH (ref 70–99)

## 2014-05-03 MED ORDER — HYDROXYZINE HCL 25 MG PO TABS: ORAL_TABLET | ORAL | Status: DC

## 2014-05-03 MED ORDER — MONTELUKAST SODIUM 10 MG PO TABS: 10.0000 mg | ORAL_TABLET | Freq: Every day | ORAL | Status: DC

## 2014-05-03 MED ORDER — BUDESONIDE-FORMOTEROL FUMARATE 160-4.5 MCG/ACT IN AERO: 2.0000 | INHALATION_SPRAY | Freq: Two times a day (BID) | RESPIRATORY_TRACT | Status: DC

## 2014-05-03 MED ORDER — DESVENLAFAXINE SUCCINATE ER 50 MG PO TB24: 50.0000 mg | ORAL_TABLET | Freq: Every day | ORAL | Status: DC

## 2014-05-03 MED ORDER — OXYBUTYNIN CHLORIDE ER 10 MG PO TB24: 10.0000 mg | ORAL_TABLET | Freq: Every day | ORAL | Status: DC

## 2014-05-03 MED ORDER — INSULIN ASPART PROT & ASPART (70-30 MIX) 100 UNIT/ML ~~LOC~~ SUSP: 30.0000 [IU] | Freq: Two times a day (BID) | SUBCUTANEOUS | Status: DC

## 2014-05-03 MED ORDER — ALBUTEROL SULFATE (2.5 MG/3ML) 0.083% IN NEBU: 2.5000 mg | INHALATION_SOLUTION | Freq: Four times a day (QID) | RESPIRATORY_TRACT | Status: DC | PRN

## 2014-05-03 MED ORDER — TRAZODONE HCL 50 MG PO TABS: 50.0000 mg | ORAL_TABLET | Freq: Every evening | ORAL | Status: DC | PRN

## 2014-05-03 MED ORDER — CETIRIZINE HCL 10 MG PO TABS: 10.0000 mg | ORAL_TABLET | Freq: Every day | ORAL | Status: DC

## 2014-05-03 MED ORDER — HYDROXYZINE HCL 25 MG PO TABS
25.0000 mg | ORAL_TABLET | Freq: Three times a day (TID) | ORAL | Status: DC | PRN
  Filled 2014-05-03: qty 12

## 2014-05-03 MED ORDER — ACETAMINOPHEN 325 MG PO TABS: 650.0000 mg | ORAL_TABLET | Freq: Four times a day (QID) | ORAL | Status: AC | PRN

## 2014-05-03 MED ORDER — ESOMEPRAZOLE MAGNESIUM 40 MG PO CPDR: 40.0000 mg | DELAYED_RELEASE_CAPSULE | Freq: Every day | ORAL | Status: DC

## 2014-05-03 MED ORDER — NYSTATIN 100000 UNIT/GM EX CREA: 1.0000 "application " | TOPICAL_CREAM | Freq: Two times a day (BID) | CUTANEOUS | Status: AC | PRN

## 2014-05-03 MED ORDER — ROPINIROLE HCL 1 MG PO TABS: 1.0000 mg | ORAL_TABLET | Freq: Every day | ORAL | Status: DC

## 2014-05-03 NOTE — Progress Notes (Signed)
Adult Psychoeducational Group Note  Date:  05/03/2014 Time:  5:05 PM  Group Topic/Focus:  Relapse Prevention Planning:   The focus of this group is to define relapse and discuss the need for planning to combat relapse.  Participation Level:  Active  Participation Quality:  Appropriate, Attentive, Sharing and Supportive  Affect:  Appropriate  Cognitive:  Appropriate  Insight: Appropriate  Engagement in Group:  Engaged and Supportive  Modes of Intervention:  Discussion, Socialization and Support  Additional Comments:   Yoshio Seliga C Skya Mccullum 05/03/2014, 5:05 PM 

## 2014-05-03 NOTE — Progress Notes (Signed)
[facility] Adult Case Management Discharge Plan :  Will you be returning to the same living situation after discharge: Yes,  home with cousin At discharge, do you have transportation home?:Yes,  taxi voucher in chart Do you have the ability to pay for your medications:Yes,  medicare  Release of information consent forms completed and submitted to Medical records by CSW. Patient to Follow up at: Follow-up Information   Follow up with [facility] On 05/06/2014. (Appt for hospital follow-up at 10:30AM for medication management/assessment for therapy services. [facility] is scheduled to pick you up for this appt.  )    Contact information:   [address] [place], [place] [address] Phone: [phone] Fax: [phone]      Patient denies SI/HI:   Yes,  during group/self report.    Safety Planning and Suicide Prevention discussed:  Yes,  SPE completed with pt's cousin. SPI pamphlet provided to pt and she was encouraged to share information with support network, ask questions, and talk about any concerns relating to SPE.  Maevyn Riordan Smart LCSWA 05/03/2014, 10:45 AM

## 2014-05-03 NOTE — [facility] Group Notes (Signed)
[facility] LCSW Group Therapy  05/03/2014 2:40 PM  Type of Therapy:  Group Therapy  Participation Level:  Active  Participation Quality:  Attentive  Affect:  Appropriate  Cognitive:  Alert and Oriented  Insight:  Engaged  Engagement in Therapy:  Engaged  Modes of Intervention:  Confrontation, Discussion, Education, Exploration, Problem-solving, Rapport Building, Socialization and Support  Summary of Progress/Problems: Feelings around Relapse. Group members discussed the meaning of relapse and shared personal stories of relapse, how it affected them and others, and how they perceived themselves during this time. Group members were encouraged to identify triggers, warning signs and coping skills used when facing the possibility of relapse. Skila shows progress in the group setting AEB her ability to acitvely participate in group discussion. She described her issues with eating unhealthy and her "sugar addiction" that causes problems with diabetes and medical health. Fatuma shared that she learned a lot while being at [facility] and found a "true support network here."    [employer] LCSWA  05/03/2014, 2:40 PM

## 2014-05-03 NOTE — [facility] Group Notes (Signed)
[facility] LCSW Aftercare Discharge Planning Group Note   05/03/2014 9:53 AM  Participation Quality:  Appropriate   Mood/Affect:  Appropriate  Depression Rating:  2  Anxiety Rating:  5  Thoughts of Suicide:  No Will you contract for safety?   NA  Current AVH:  No  Plan for Discharge/Comments:  Pt reports that she is excited to d/c today. APS did not call CSW back-pt returning home and will follow up at [facility]. She plans to attend mental health groups in [place] when she gets transportation.   Transportation Means: taxi voucher in chart   Supports: cousin  Teaching laboratory technician

## 2014-05-03 NOTE — Progress Notes (Signed)
Pt observed resting in bed with eyes closed. No acute distress. RR WNL, even and unlabored. Level III obs in place for safety and pt remains safe. Tacey Dimaggio Eakes Adrian Dinovo  

## 2014-05-03 NOTE — Discharge Summary (Signed)
Physician Discharge Summary Note  Patient:  Isabel [name] is an 58 y.o., female MRN:  [id number] DOB:  Jan 04, 1956 Patient phone:  [phone] (home)  Patient address:   [address] [place] [address],  Total Time spent with patient: Greater than 30 minutes  Date of Admission:  04/25/2014 Date of Discharge: 05/03/14  Reason for Admission: Mood stabilization  Discharge Diagnoses: Active Problems:   Major depression   PTSD (post-traumatic stress disorder)   Psychiatric Specialty Exam: Physical Exam  Psychiatric: Her speech is normal and behavior is normal. Judgment and thought content normal. Her mood appears not anxious. Her affect is not angry, not blunt, not labile and not inappropriate. Cognition and memory are normal. She does not exhibit a depressed mood.    Review of Systems  Constitutional: Negative.   HENT: Negative.   Eyes: Negative.   Respiratory: Negative.   Cardiovascular: Negative.   Gastrointestinal: Negative.   Genitourinary: Negative.   Musculoskeletal: Negative.   Skin: Negative.   Neurological: Negative.   Endo/Heme/Allergies: Negative.   Psychiatric/Behavioral: Positive for depression (Stabilized with medication prior to discharge). Negative for suicidal ideas, hallucinations, memory loss and substance abuse. The patient has insomnia (Stabilized with medication prior to discharge). The patient is not nervous/anxious.     Blood pressure 111/78, pulse 79, temperature 97.4 F (36.3 C), temperature source Oral, resp. rate 16, height 5' 4.5" (1.638 m), weight 70.308 kg (155 lb).Body mass index is 26.2 kg/(m^2).   General Appearance: Fairly Groomed   Patent attorney:: Fair   Speech: Clear and Coherent   Volume: Normal   Mood: Anxious   Affect: anxious   Thought Process: Coherent and Goal Directed   Orientation: Full (Time, Place, and Person)   Thought Content: plans as she moves on, life style changes that she plans to implement   Suicidal Thoughts: No    Homicidal Thoughts: No   Memory: Immediate; Fair  Recent; Fair  Remote; Fair   Judgement: Fair   Insight: Present   Psychomotor Activity: Normal   Concentration: Fair   Recall: [employer] of Knowledge:NA   Language: Fair   Akathisia: No   Handed: Right   AIMS (if indicated):   Assets: Desire for Improvement  Housing   Sleep: Number of Hours: 6    Past Psychiatric History: Diagnosis: Major Depressive Disorder - Severe (296.23), PTSD  Hospitalizations: NA  Outpatient Care: [facility] in [place], [place]  Substance Abuse Care: NA  Self-Mutilation: Denies attempts, admits history thought of suicide  Suicidal Attempts: Denies attempts, admits hx of thoughts of suicide  Violent Behaviors: NA   Musculoskeletal: Strength & Muscle Tone: within normal limits Gait & Station: normal Patient leans: N/A  DSM5: Schizophrenia Disorders:  NA Obsessive-Compulsive Disorders:  NA Trauma-Stressor Disorders:  Posttraumatic Stress Disorder (309.81) Substance/Addictive Disorders:  NA Depressive Disorders:  Major Depressive Disorder - Severe (296.23)  Axis Diagnosis:   AXIS I:  Major Depressive Disorder - Severe (296.23), PTSD AXIS II:  Deferred AXIS III:   Past Medical History  Diagnosis Date  . Allergy   . Asthma   . Depression   . GERD (gastroesophageal reflux disease)   . Hypertension   . Cystitis, interstitial   . Pneumonia   . Diabetes mellitus without complication   . Pancreatic pseudocyst   . Sleep apnea     wears a CPAP at night    AXIS IV:  other psychosocial or environmental problems and Mental illness, chronic AXIS V:  62  Level  of Care:  OP  Hospital Course:  58 Y/O female who stated she felt like killing herself, and killing her cousin. States they get on each others neverves. states she went to [facility] and she felt that they did not want to help. States she has no one to talk to. States she is in emotional pain, mother died one year ago no one tried to help  her deal with it. States last July she tried to kill herself after her mother died in a nursing home. She does not know what did she died of. States she was [facility] to [facility], could not go because of transportation.  While a patient in this hospital, Aurther Lofterry received medication management for her mood stabilization. She was ordered, received and discharged on Pristiq 50 mg daily for depression, Hydroxyzine 25 mg three times daily as needed for anxiety symptoms and Trazodone 50 mg Q bedtime for sleep. She was also enrolled in the group counseling sessions and AA/NA meetings being offered on this unit. She learned coping skills. Aurther Lofterry complained that the reason for her depression is boredom. She was encouraged to get involved within her community for some volunteer work on as needed basis. Aurther Lofterry is observed to enjoy reading novels and working on word puzzles. Besides the mood stabilization treatment, Aurther Lofterry also was resumed on her pertinent home medications for her other medical issues. She tolerated her treatment regimen without any significant adverse and or reactions.  Keleigh's mood is stable. This is evidenced by her reports of improved mood and absence of suicidal ideations. She is currently being discharged to continue psychiatric treatment and routine medication management at the [facility] in [place], [place]. She is provided with all the pertinent information required to make this appointment without problems. Upon discharge, she adamantly denies any SIHI, AVH, delusional thoughts and or paranoia. She was provided with a 4 days worth, supply samples of her [facility] discharge medications. She left [facility] with all personal belongings in no apparent distress. Transportation per taxi cab. Cab fare [facility] by [facility].    Consults:  psychiatry  Significant Diagnostic Studies:  labs: CBC with diff, CMP, UDS, toxicology tests, U/A  Discharge Vitals:   Blood pressure 111/78, pulse 79, temperature 97.4 F (36.3 C),  temperature source Oral, resp. rate 16, height 5' 4.5" (1.638 m), weight 70.308 kg (155 lb). Body mass index is 26.2 kg/(m^2). Lab Results:   Results for orders placed during the hospital encounter of 04/25/14 (from the past 72 hour(s))  GLUCOSE, CAPILLARY     Status: Abnormal   Collection Time    04/30/14 11:56 AM      Result Value Ref Range   Glucose-Capillary 160 (*) 70 - 99 mg/dL  GLUCOSE, CAPILLARY     Status: Abnormal   Collection Time    04/30/14  4:58 PM      Result Value Ref Range   Glucose-Capillary 184 (*) 70 - 99 mg/dL  GLUCOSE, CAPILLARY     Status: None   Collection Time    04/30/14  9:19 PM      Result Value Ref Range   Glucose-Capillary 79  70 - 99 mg/dL  GLUCOSE, CAPILLARY     Status: Abnormal   Collection Time    05/01/14  5:46 AM      Result Value Ref Range   Glucose-Capillary 122 (*) 70 - 99 mg/dL   Comment 1 Notify RN    GLUCOSE, CAPILLARY     Status: None   Collection  Time    05/01/14 12:00 PM      Result Value Ref Range   Glucose-Capillary 78  70 - 99 mg/dL  GLUCOSE, CAPILLARY     Status: Abnormal   Collection Time    05/01/14  4:53 PM      Result Value Ref Range   Glucose-Capillary 281 (*) 70 - 99 mg/dL  GLUCOSE, CAPILLARY     Status: Abnormal   Collection Time    05/01/14  9:10 PM      Result Value Ref Range   Glucose-Capillary 55 (*) 70 - 99 mg/dL  GLUCOSE, CAPILLARY     Status: Abnormal   Collection Time    05/01/14  9:19 PM      Result Value Ref Range   Glucose-Capillary 67 (*) 70 - 99 mg/dL  GLUCOSE, CAPILLARY     Status: None   Collection Time    05/01/14  9:31 PM      Result Value Ref Range   Glucose-Capillary 89  70 - 99 mg/dL  GLUCOSE, CAPILLARY     Status: Abnormal   Collection Time    05/02/14  6:03 AM      Result Value Ref Range   Glucose-Capillary 136 (*) 70 - 99 mg/dL  GLUCOSE, CAPILLARY     Status: None   Collection Time    05/02/14 11:50 AM      Result Value Ref Range   Glucose-Capillary 78  70 - 99 mg/dL   Comment 1  Notify RN    GLUCOSE, CAPILLARY     Status: Abnormal   Collection Time    05/02/14  4:30 PM      Result Value Ref Range   Glucose-Capillary 401 (*) 70 - 99 mg/dL   Comment 1 Notify RN    GLUCOSE, CAPILLARY     Status: Abnormal   Collection Time    05/02/14  6:47 PM      Result Value Ref Range   Glucose-Capillary 192 (*) 70 - 99 mg/dL   Comment 1 Notify RN    GLUCOSE, CAPILLARY     Status: None   Collection Time    05/02/14  9:52 PM      Result Value Ref Range   Glucose-Capillary 72  70 - 99 mg/dL   Comment 1 Notify RN    GLUCOSE, CAPILLARY     Status: Abnormal   Collection Time    05/03/14  6:13 AM      Result Value Ref Range   Glucose-Capillary 158 (*) 70 - 99 mg/dL    Physical Findings: AIMS: Facial and Oral Movements Muscles of Facial Expression: None, normal Lips and Perioral Area: None, normal Jaw: None, normal Tongue: None, normal,Extremity Movements Upper (arms, wrists, hands, fingers): None, normal Lower (legs, knees, ankles, toes): None, normal, Trunk Movements Neck, shoulders, hips: None, normal, Overall Severity Severity of abnormal movements (highest score from questions above): None, normal Incapacitation due to abnormal movements: None, normal Patient's awareness of abnormal movements (rate only patient's report): No Awareness, Dental Status Current problems with teeth and/or dentures?: No Does patient usually wear dentures?: No  CIWA:  CIWA-Ar Total: 0 COWS:     Psychiatric Specialty Exam: See Psychiatric Specialty Exam and Suicide Risk Assessment completed by Attending Physician prior to discharge.  Discharge destination:  Home  Is patient on multiple antipsychotic therapies at discharge:  No   Has Patient had three or more failed trials of antipsychotic monotherapy by history:  No  Recommended Plan for Multiple  Antipsychotic Therapies: NA    Medication List       Indication   acetaminophen 325 MG tablet  Commonly known as:  TYLENOL  Take 2  tablets (650 mg total) by mouth every 6 (six) hours as needed for headache.   Indication:  Pain     albuterol (2.5 MG/3ML) 0.083% nebulizer solution  Commonly known as:  PROVENTIL  Take 3 mLs (2.5 mg total) by nebulization every 6 (six) hours as needed for wheezing or shortness of breath.   Indication:  Acute Bronchospasm     budesonide-formoterol 160-4.5 MCG/ACT inhaler  Commonly known as:  SYMBICORT  Inhale 2 puffs into the lungs 2 (two) times daily. For asthma   Indication:  Asthma     cetirizine 10 MG tablet  Commonly known as:  ZYRTEC  Take 1 tablet (10 mg total) by mouth daily. For allergies   Indication:  Perennial Rhinitis, Hayfever     desvenlafaxine 50 MG 24 hr tablet  Commonly known as:  PRISTIQ  Take 1 tablet (50 mg total) by mouth daily. For depression   Indication:  Major Depressive Disorder     esomeprazole 40 MG capsule  Commonly known as:  NEXIUM  Take 1 capsule (40 mg total) by mouth daily. For acid reflux   Indication:  Gastroesophageal Reflux Disease with Current Symptoms     hydrOXYzine 25 MG tablet  Commonly known as:  ATARAX/VISTARIL  Take 25 mg (1 tablet) three times daily as needed: For anxiety   Indication:  Anxiety associated with Organic Disease, Tension     insulin aspart protamine- aspart (70-30) 100 UNIT/ML injection  Commonly known as:  NOVOLOG MIX 70/30  Inject 0.3 mLs (30 Units total) into the skin 2 (two) times daily with a meal. For diabetes management   Indication:  Type 2 Diabetes     montelukast 10 MG tablet  Commonly known as:  SINGULAIR  Take 1 tablet (10 mg total) by mouth at bedtime. For asthma   Indication:  Asthma     nystatin cream  Commonly known as:  MYCOSTATIN  Apply 1 application topically 2 (two) times daily as needed for dry skin (rash).   Indication:  Rash     oxybutynin 10 MG 24 hr tablet  Commonly known as:  DITROPAN-XL  Take 1 tablet (10 mg total) by mouth daily. For bladder spasm   Indication:  Bladder spasms      rOPINIRole 1 MG tablet  Commonly known as:  REQUIP  Take 1 tablet (1 mg total) by mouth at bedtime. For restless leg syndrome   Indication:  Restless Leg Syndrome     traZODone 50 MG tablet  Commonly known as:  DESYREL  Take 1 tablet (50 mg total) by mouth at bedtime as needed for sleep.   Indication:  Trouble Sleeping       Follow-up Information   Follow up with Emelia Loron On 05/06/2014. (Appt for hospital follow-up at 10:30AM for medication management/assessment for therapy services. [facility] is scheduled to pick you up for this appt.  )    Contact information:   [address] [place], [place] [address] Phone: [phone] Fax: [phone]     Follow-up recommendations:  Activity:  As tolerated Diet: As recommended by your primary care doctor. Keep all scheduled follow-up appointments as recommended.   Comments:  Take all your medications as prescribed by your mental healthcare provider. Report any adverse effects and or reactions from your medicines to your outpatient provider promptly.  Patient is instructed and cautioned to not engage in alcohol and or illegal drug use while on prescription medicines. In the event of worsening symptoms, patient is instructed to call the crisis hotline, 911 and or go to the nearest ED for appropriate evaluation and treatment of symptoms. Follow-up with your primary care provider for your other medical issues, concerns and or health care needs.   Total Discharge Time:  Greater than 30 minutes.  Signed: Sanjuana Kava, PMHNP-BC 05/03/2014, 11:27 AM I personally assessed the patient and formulated the plan Madie Reno A. Dub Mikes, M.D.

## 2014-05-03 NOTE — Tx Team (Signed)
Interdisciplinary Treatment Plan Update (Adult)  Date: 05/03/2014   Time Reviewed: 10:42 AM  Progress in Treatment:  Attending groups: Yes  Participating in groups: Yes    Taking medication as prescribed: Yes  Tolerating medication: Yes  Family/Significant othe contact made: SPE completed with pt's cousin/roomate, Trish.    Patient understands diagnosis: Yes, AEB seeking treatment for mood stabilization and medication management.  Discussing patient identified problems/goals with staff: Yes  Medical problems stabilized or resolved: Yes  Denies suicidal/homicidal ideation: Yes during group/self report.  Patient has not harmed self or Others: Yes  New problem(s) identified:  Discharge Plan or Barriers: Pt plans to return home temporarily and follow up at [facility]. She plans to [facility] [place] [facility] [place] next few months due to availability of transportation and mental health resources. CSW provided pt with several resources in [place] area. CSW attempted to make contact with pt's case worker from DSS/messages left-d/c ppw will be sent to DSS per Tia's previous request. Taxi voucher approved for pt (hannah coble LSCW) Additional comments:n/a  Reason for Continuation of Hospitalization: d/c today Estimated length of stay: d/c today  For review of initial/current patient goals, please see plan of care.  Attendees:  Patient:    Family:    Physician: Geoffery Lyons MD 05/03/2014 10:42 AM   Nursing: Brayton El RN  05/03/2014 10:42 AM   Clinical Social Worker Sisto Granillo Smart, LCSWA  05/03/2014 10:42 AM   Other: Darden Dates Nurse CM  05/03/2014 10:42 AM   Other: Virgina Norfolk Case manager [facility] 05/03/2014 10:43 AM   Other: Chandra Batch. PA  05/03/2014 10:42 AM   Other: Meryl Dare RN 05/03/2014 10:43 AM   Scribe for Treatment Team:  Trula Slade LCSWA 05/03/2014 10:42 AM

## 2014-05-03 NOTE — [facility] Suicide Risk Assessment (Signed)
Suicide Risk Assessment  Discharge Assessment     Demographic Factors:  Caucasian  Total Time spent with patient: 45 minutes  Psychiatric Specialty Exam:     Blood pressure 111/78, pulse 79, temperature 97.4 F (36.3 C), temperature source Oral, resp. rate 16, height 5' 4.5" (1.638 m), weight 70.308 kg (155 lb).Body mass index is 26.2 kg/(m^2).  General Appearance: Fairly Groomed  Patent attorney::  Fair  Speech:  Clear and Coherent  Volume:  Normal  Mood:  Anxious  Affect:  anxious  Thought Process:  Coherent and Goal Directed  Orientation:  Full (Time, Place, and Person)  Thought Content:  plans as she moves on, life style changes that she plans to implement  Suicidal Thoughts:  No  Homicidal Thoughts:  No  Memory:  Immediate;   Fair Recent;   Fair Remote;   Fair  Judgement:  Fair  Insight:  Present  Psychomotor Activity:  Normal  Concentration:  Fair  Recall:  [employer] of Knowledge:NA  Language: Fair  Akathisia:  No  Handed:  Right  AIMS (if indicated):     Assets:  Desire for Improvement Housing  Sleep:  Number of Hours: 6    Musculoskeletal: Strength & Muscle Tone: within normal limits Gait & Station: normal Patient leans: N/A   Mental Status Per Nursing Assessment::   On Admission:     Current Mental Status by Physician: In full contact with reality. There are no active SI/HI plans or intent. Her mood is euthymic her affect is appropriate   Loss Factors: NA  Historical Factors: NA  Risk Reduction Factors:   Living with another person, especially a relative  Continued Clinical Symptoms:  Depression:   Insomnia  Cognitive Features That Contribute To Risk:  Closed-mindedness Polarized thinking Thought constriction (tunnel vision)    Suicide Risk:  Minimal: No identifiable suicidal ideation.  Patients presenting with no risk factors but with morbid ruminations; may be classified as minimal risk based on the severity of the depressive  symptoms  Discharge Diagnoses:   AXIS I:  Major Depression recurrent, PTSD AXIS II:  No diagnosis AXIS III:   Past Medical History  Diagnosis Date  . Allergy   . Asthma   . Depression   . GERD (gastroesophageal reflux disease)   . Hypertension   . Cystitis, interstitial   . Pneumonia   . Diabetes mellitus without complication   . Pancreatic pseudocyst   . Sleep apnea     wears a CPAP at night    AXIS IV:  other psychosocial or environmental problems AXIS V:  61-70 mild symptoms  Plan Of Care/Follow-up recommendations:  Activity:  as tolerated Diet:  regular Follow up [facility] Is patient on multiple antipsychotic therapies at discharge:  No   Has Patient had three or more failed trials of antipsychotic monotherapy by history:  No  Recommended Plan for Multiple Antipsychotic Therapies: NA    Rachael Fee 05/03/2014, 1:22 PM

## 2014-05-06 NOTE — Progress Notes (Signed)
Patient Discharge Instructions:  After Visit Summary (AVS):   Faxed to:  05/06/14 Discharge Summary Note:   Faxed to:  05/06/14 Psychiatric Admission Assessment Note:   Faxed to:  05/06/14 Suicide Risk Assessment - Discharge Assessment:   Faxed to:  05/06/14 Faxed/Sent to the Next Level Care provider:  05/06/14 Faxed to [facility] @ [phone]  Jerelene Redden, 05/06/2014, 4:00 PM

## 2014-05-07 NOTE — Clinical Social Work Note (Signed)
Tia Turner (pt's caseworker) has not returned calls made by CSW. Pt d/ced several days ago. Dishcharge summary, AVS, and original H&P faxed to caseworker at [phone] per her original request.  [facility], LCSWA 05/07/2014 12:25 PM

## 2014-05-08 DIAGNOSIS — F332 Major depressive disorder, recurrent severe without psychotic features: Secondary | ICD-10-CM | POA: Diagnosis not present

## 2014-05-16 ENCOUNTER — Other Ambulatory Visit: Payer: Self-pay | Admitting: Family Medicine

## 2014-05-16 DIAGNOSIS — F332 Major depressive disorder, recurrent severe without psychotic features: Secondary | ICD-10-CM | POA: Diagnosis not present

## 2014-05-24 DIAGNOSIS — G4710 Hypersomnia, unspecified: Secondary | ICD-10-CM | POA: Diagnosis not present

## 2014-05-24 DIAGNOSIS — R0989 Other specified symptoms and signs involving the circulatory and respiratory systems: Secondary | ICD-10-CM | POA: Diagnosis not present

## 2014-05-24 DIAGNOSIS — G4730 Sleep apnea, unspecified: Secondary | ICD-10-CM | POA: Diagnosis not present

## 2014-05-24 DIAGNOSIS — J3089 Other allergic rhinitis: Secondary | ICD-10-CM | POA: Diagnosis not present

## 2014-05-24 DIAGNOSIS — K219 Gastro-esophageal reflux disease without esophagitis: Secondary | ICD-10-CM | POA: Diagnosis not present

## 2014-05-24 DIAGNOSIS — R0609 Other forms of dyspnea: Secondary | ICD-10-CM | POA: Diagnosis not present

## 2014-06-12 DIAGNOSIS — F332 Major depressive disorder, recurrent severe without psychotic features: Secondary | ICD-10-CM | POA: Diagnosis not present

## 2014-06-14 ENCOUNTER — Other Ambulatory Visit: Payer: Self-pay | Admitting: Family Medicine

## 2014-07-10 DIAGNOSIS — F331 Major depressive disorder, recurrent, moderate: Secondary | ICD-10-CM | POA: Diagnosis not present

## 2014-07-13 ENCOUNTER — Other Ambulatory Visit: Payer: Self-pay | Admitting: Family Medicine

## 2014-07-16 DIAGNOSIS — K219 Gastro-esophageal reflux disease without esophagitis: Secondary | ICD-10-CM | POA: Diagnosis not present

## 2014-07-16 DIAGNOSIS — G4710 Hypersomnia, unspecified: Secondary | ICD-10-CM | POA: Diagnosis not present

## 2014-07-16 DIAGNOSIS — J45909 Unspecified asthma, uncomplicated: Secondary | ICD-10-CM | POA: Diagnosis not present

## 2014-07-16 DIAGNOSIS — J3089 Other allergic rhinitis: Secondary | ICD-10-CM | POA: Diagnosis not present

## 2014-07-16 DIAGNOSIS — G4730 Sleep apnea, unspecified: Secondary | ICD-10-CM | POA: Diagnosis not present

## 2014-08-05 ENCOUNTER — Other Ambulatory Visit: Payer: Self-pay | Admitting: Family Medicine

## 2014-08-06 NOTE — Telephone Encounter (Signed)
Per Dr. [name], okay to send in all scripts for 1 year.

## 2014-08-07 DIAGNOSIS — E119 Type 2 diabetes mellitus without complications: Secondary | ICD-10-CM | POA: Diagnosis not present

## 2014-08-10 ENCOUNTER — Other Ambulatory Visit: Payer: Self-pay | Admitting: Family Medicine

## 2014-08-16 DIAGNOSIS — K219 Gastro-esophageal reflux disease without esophagitis: Secondary | ICD-10-CM | POA: Diagnosis not present

## 2014-08-16 DIAGNOSIS — G4710 Hypersomnia, unspecified: Secondary | ICD-10-CM | POA: Diagnosis not present

## 2014-08-16 DIAGNOSIS — G4730 Sleep apnea, unspecified: Secondary | ICD-10-CM | POA: Diagnosis not present

## 2014-08-16 DIAGNOSIS — J3089 Other allergic rhinitis: Secondary | ICD-10-CM | POA: Diagnosis not present

## 2014-08-16 DIAGNOSIS — J45909 Unspecified asthma, uncomplicated: Secondary | ICD-10-CM | POA: Diagnosis not present

## 2014-09-02 DIAGNOSIS — J3089 Other allergic rhinitis: Secondary | ICD-10-CM | POA: Diagnosis not present

## 2014-09-03 DIAGNOSIS — F331 Major depressive disorder, recurrent, moderate: Secondary | ICD-10-CM | POA: Diagnosis not present

## 2014-09-10 DIAGNOSIS — F3341 Major depressive disorder, recurrent, in partial remission: Secondary | ICD-10-CM | POA: Diagnosis not present

## 2014-09-24 ENCOUNTER — Other Ambulatory Visit: Payer: Self-pay | Admitting: Family Medicine

## 2014-09-30 ENCOUNTER — Telehealth: Payer: Self-pay

## 2014-09-30 NOTE — Telephone Encounter (Signed)
Isabel [name] from [facility] called and states she needed last time pt was seen by Dr. Clent RidgesFry and last HgA1C.  Advised that pt was last seen in by Dr. Clent RidgesFry on 4.3.14 and last A1C was 5.22.2015.  Isabel [name] states she would tell pt to follow up with Dr. Clent RidgesFry soon.

## 2014-10-01 DIAGNOSIS — R5383 Other fatigue: Secondary | ICD-10-CM | POA: Diagnosis not present

## 2014-10-01 DIAGNOSIS — J4520 Mild intermittent asthma, uncomplicated: Secondary | ICD-10-CM | POA: Diagnosis not present

## 2014-10-01 DIAGNOSIS — K219 Gastro-esophageal reflux disease without esophagitis: Secondary | ICD-10-CM | POA: Diagnosis not present

## 2014-10-01 DIAGNOSIS — G4733 Obstructive sleep apnea (adult) (pediatric): Secondary | ICD-10-CM | POA: Diagnosis not present

## 2014-10-01 DIAGNOSIS — E119 Type 2 diabetes mellitus without complications: Secondary | ICD-10-CM | POA: Diagnosis not present

## 2014-10-30 ENCOUNTER — Other Ambulatory Visit: Payer: Self-pay | Admitting: Family Medicine

## 2014-11-08 ENCOUNTER — Encounter: Payer: Self-pay | Admitting: Family Medicine

## 2014-11-08 ENCOUNTER — Other Ambulatory Visit: Payer: Self-pay | Admitting: Family Medicine

## 2014-11-08 ENCOUNTER — Ambulatory Visit (INDEPENDENT_AMBULATORY_CARE_PROVIDER_SITE_OTHER): Payer: Medicare Other | Admitting: Family Medicine

## 2014-11-08 VITALS — BP 130/90 | Temp 97.5°F | Wt 146.0 lb

## 2014-11-08 DIAGNOSIS — F331 Major depressive disorder, recurrent, moderate: Secondary | ICD-10-CM | POA: Diagnosis not present

## 2014-11-08 DIAGNOSIS — F4310 Post-traumatic stress disorder, unspecified: Secondary | ICD-10-CM | POA: Diagnosis not present

## 2014-11-08 MED ORDER — OXYBUTYNIN CHLORIDE ER 10 MG PO TB24: 10.0000 mg | ORAL_TABLET | Freq: Every day | ORAL | Status: DC

## 2014-11-08 MED ORDER — DESVENLAFAXINE SUCCINATE ER 50 MG PO TB24: 50.0000 mg | ORAL_TABLET | Freq: Every day | ORAL | Status: DC

## 2014-11-08 MED ORDER — DIAZEPAM 5 MG PO TABS: 5.0000 mg | ORAL_TABLET | Freq: Two times a day (BID) | ORAL | Status: DC | PRN

## 2014-11-08 MED ORDER — TRAZODONE HCL 100 MG PO TABS: 100.0000 mg | ORAL_TABLET | Freq: Every day | ORAL | Status: DC

## 2014-11-08 MED ORDER — IMIPRAMINE HCL 50 MG PO TABS: 50.0000 mg | ORAL_TABLET | Freq: Every day | ORAL | Status: DC

## 2014-11-08 NOTE — Progress Notes (Signed)
Pre visit review using our clinic review tool, if applicable. No additional management support is needed unless otherwise documented below in the visit note. Lab Results  Component Value Date   HGBA1C 13.6* 04/26/2014   HGBA1C 13.0* 06/09/2013   HGBA1C 9.2* 10/09/2012   Lab Results  Component Value Date   MICROALBUR 1.2 10/09/2012   LDLCALC 59 10/09/2012   CREATININE 0.42* 04/24/2014

## 2014-11-08 NOTE — Progress Notes (Signed)
   Subjective:    Patient ID: Isabel [name] Isabel Elza, female    DOB: 28-May-1956, [age] y.o.   MRN: [id number]  HPI Here to follow up on depression primarily. She was hospitalized briefly in May after a suicide attempt by overdose, and she was seen by Dr. Dub MikesLugo for a time. After her DC home she has been seeing Mental Health in [place], but she is not happy with how this is going. She wants us to take over prescribing her meds. She feels better but she is still depressed. Two events earlier this year were the catalyst that got her so upset. First she found out that her mother had died a year ago, and no one had told her. Also she found out that her mother's body had been cremated and that she was not buried next to her father's [place], as everyone had planned for years. These things have been bothering her ever since, but she has come a long way in her treatment. She was diagnosed with major depression and PTSD. She is pleased with how Pristiq works for her, but she does not like Hydroxyzine which was prescribed to her for anxiety. All this does is make her sleepy all day.    Review of Systems  Constitutional: Negative.   Respiratory: Negative.   Cardiovascular: Negative.   Neurological: Negative.   Psychiatric/Behavioral: Positive for dysphoric mood and agitation. Negative for suicidal ideas, hallucinations, behavioral problems, confusion, sleep disturbance, self-injury and decreased concentration. The patient is nervous/anxious. The patient is not hyperactive.        Objective:   Physical Exam  Constitutional: She is oriented to person, place, and time. She appears well-developed and well-nourished.  Cardiovascular: Normal rate, regular rhythm, normal heart sounds and intact distal pulses.   Pulmonary/Chest: Effort normal and breath sounds normal.  Neurological: She is alert and oriented to person, place, and time.  Psychiatric: She has a normal mood and affect. Her behavior is normal. Judgment and  thought content normal.          Assessment & Plan:  We agreed to assume management of her psychiatric meds. We will stop the Hydroxyzine and try Valium 5 mg bid. She contracted with me today to not try to harm herself and to tell us if she had these thoughts again. Recheck in one month.

## 2014-11-18 ENCOUNTER — Other Ambulatory Visit: Payer: Self-pay | Admitting: Family Medicine

## 2014-12-10 ENCOUNTER — Ambulatory Visit (INDEPENDENT_AMBULATORY_CARE_PROVIDER_SITE_OTHER): Payer: Medicare Other | Admitting: Family Medicine

## 2014-12-10 ENCOUNTER — Encounter: Payer: Self-pay | Admitting: Family Medicine

## 2014-12-10 VITALS — BP 124/92 | HR 73 | Temp 97.8°F | Ht 64.5 in | Wt 151.0 lb

## 2014-12-10 DIAGNOSIS — F328 Other depressive episodes: Secondary | ICD-10-CM

## 2014-12-10 DIAGNOSIS — F3289 Other specified depressive episodes: Secondary | ICD-10-CM

## 2014-12-10 DIAGNOSIS — E1169 Type 2 diabetes mellitus with other specified complication: Secondary | ICD-10-CM

## 2014-12-10 DIAGNOSIS — Z79899 Other long term (current) drug therapy: Secondary | ICD-10-CM | POA: Diagnosis not present

## 2014-12-10 DIAGNOSIS — E669 Obesity, unspecified: Secondary | ICD-10-CM

## 2014-12-10 DIAGNOSIS — E119 Type 2 diabetes mellitus without complications: Secondary | ICD-10-CM

## 2014-12-10 DIAGNOSIS — I10 Essential (primary) hypertension: Secondary | ICD-10-CM

## 2014-12-10 DIAGNOSIS — F4310 Post-traumatic stress disorder, unspecified: Secondary | ICD-10-CM | POA: Diagnosis not present

## 2014-12-10 LAB — CBC WITH DIFFERENTIAL/PLATELET
BASOS PCT: 0.4 % (ref 0.0–3.0)
Basophils Absolute: 0 10*3/uL (ref 0.0–0.1)
EOS ABS: 0.1 10*3/uL (ref 0.0–0.7)
Eosinophils Relative: 0.9 % (ref 0.0–5.0)
HCT: 41 % (ref 36.0–46.0)
HEMOGLOBIN: 13.3 g/dL (ref 12.0–15.0)
Lymphocytes Relative: 31.8 % (ref 12.0–46.0)
Lymphs Abs: 2 10*3/uL (ref 0.7–4.0)
MCHC: 32.5 g/dL (ref 30.0–36.0)
MCV: 83.3 fl (ref 78.0–100.0)
MONO ABS: 0.5 10*3/uL (ref 0.1–1.0)
Monocytes Relative: 8.3 % (ref 3.0–12.0)
NEUTROS ABS: 3.6 10*3/uL (ref 1.4–7.7)
Neutrophils Relative %: 58.6 % (ref 43.0–77.0)
Platelets: 226 10*3/uL (ref 150.0–400.0)
RBC: 4.92 Mil/uL (ref 3.87–5.11)
RDW: 14.2 % (ref 11.5–15.5)
WBC: 6.2 10*3/uL (ref 4.0–10.5)

## 2014-12-10 LAB — BASIC METABOLIC PANEL
BUN: 19 mg/dL (ref 6–23)
CHLORIDE: 102 meq/L (ref 96–112)
CO2: 27 mEq/L (ref 19–32)
Calcium: 10 mg/dL (ref 8.4–10.5)
Creatinine, Ser: 0.6 mg/dL (ref 0.4–1.2)
GFR: 105.59 mL/min (ref >60.00)
Glucose, Bld: 365 mg/dL — ABNORMAL HIGH (ref 70–99)
POTASSIUM: 5.8 meq/L — AB (ref 3.5–5.1)
Sodium: 141 mEq/L (ref 135–145)

## 2014-12-10 LAB — HEPATIC FUNCTION PANEL
ALT: 21 U/L (ref 0–35)
AST: 26 U/L (ref 0–37)
Albumin: 3.8 g/dL (ref 3.5–5.2)
Alkaline Phosphatase: 123 U/L — ABNORMAL HIGH (ref 39–117)
BILIRUBIN DIRECT: 0 mg/dL (ref 0.0–0.3)
BILIRUBIN TOTAL: 0.5 mg/dL (ref 0.2–1.2)
Total Protein: 8.1 g/dL (ref 6.0–8.3)

## 2014-12-10 LAB — MICROALBUMIN / CREATININE URINE RATIO
Creatinine,U: 71.3 mg/dL
MICROALB/CREAT RATIO: 1.1 mg/g (ref 0.0–30.0)
Microalb, Ur: 0.8 mg/dL (ref 0.0–1.9)

## 2014-12-10 LAB — LIPID PANEL
CHOLESTEROL: 161 mg/dL (ref 0–200)
HDL: 56.4 mg/dL (ref >39.00)
LDL CALC: 96 mg/dL (ref 0–99)
NONHDL: 104.6
Total CHOL/HDL Ratio: 3
Triglycerides: 42 mg/dL (ref 0.0–149.0)
VLDL: 8.4 mg/dL (ref 0.0–40.0)

## 2014-12-10 LAB — HEMOGLOBIN A1C

## 2014-12-10 LAB — TSH: TSH: 3.91 u[IU]/mL (ref 0.35–4.50)

## 2014-12-10 NOTE — Progress Notes (Signed)
Pre visit review using our clinic review tool, if applicable. No additional management support is needed unless otherwise documented below in the visit note. 

## 2014-12-10 NOTE — Progress Notes (Signed)
   Subjective:    Patient ID: Isabel [name], female    DOB: 12/21/1955, 59 y.o.   MRN: [id number]  HPI Here to follow up. She is pleased with how Pristiq is working for her and the recent addition of prn Valium has been helpful. She takes this once or twice a day. She is sleeping better as well. She does not check her glucoses very often but she is trying to watch her diet.    Review of Systems  Constitutional: Negative.   Respiratory: Negative.   Cardiovascular: Negative.   Neurological: Negative.   Psychiatric/Behavioral: Negative for behavioral problems, confusion, dysphoric mood and agitation. The patient is nervous/anxious.        Objective:   Physical Exam  Constitutional: She is oriented to person, place, and time. She appears well-developed and well-nourished.  Cardiovascular: Normal rate, regular rhythm, normal heart sounds and intact distal pulses.   Pulmonary/Chest: Effort normal and breath sounds normal.  Neurological: She is alert and oriented to person, place, and time.  Psychiatric: She has a normal mood and affect. Her behavior is normal. Thought content normal.          Assessment & Plan:  She is stable from a psychiatric standpoint. Get labs today including an A1c.

## 2014-12-11 ENCOUNTER — Telehealth: Payer: Self-pay | Admitting: Family Medicine

## 2014-12-11 NOTE — Telephone Encounter (Signed)
Pt would like a call back . She has questions about some visits she has had .

## 2014-12-11 NOTE — Telephone Encounter (Signed)
The note was written  

## 2014-12-11 NOTE — Telephone Encounter (Signed)
trish boyst called back (she is on dpr) to request a letter stating pt was seen by Dr fry in our office on the dates seen, (11/08/14 and 12/10/14) She needs this faxed, but only has the phone number, but not fax. AttnPattricia Boss: eloisa jennings  [phone] [place] is [employer]

## 2014-12-11 NOTE — Telephone Encounter (Signed)
I faxed to [phone].

## 2014-12-12 ENCOUNTER — Telehealth: Payer: Self-pay | Admitting: Family Medicine

## 2014-12-12 NOTE — Telephone Encounter (Signed)
emmi emailed °

## 2014-12-18 NOTE — Addendum Note (Signed)
Addended by: Gershon CraneFRY, STEPHEN A on: 12/18/2014 05:21 PM   Modules accepted: Orders

## 2014-12-24 DIAGNOSIS — J309 Allergic rhinitis, unspecified: Secondary | ICD-10-CM | POA: Diagnosis not present

## 2014-12-24 DIAGNOSIS — J4530 Mild persistent asthma, uncomplicated: Secondary | ICD-10-CM | POA: Diagnosis not present

## 2014-12-24 DIAGNOSIS — G4733 Obstructive sleep apnea (adult) (pediatric): Secondary | ICD-10-CM | POA: Diagnosis not present

## 2014-12-24 DIAGNOSIS — K219 Gastro-esophageal reflux disease without esophagitis: Secondary | ICD-10-CM | POA: Diagnosis not present

## 2014-12-26 ENCOUNTER — Encounter: Payer: Self-pay | Admitting: Family Medicine

## 2014-12-26 ENCOUNTER — Telehealth: Payer: Self-pay | Admitting: Family Medicine

## 2014-12-26 NOTE — Telephone Encounter (Signed)
Pt received lab resuts and still waiting to see if md is going to refer her to endocrinologist for dm.

## 2014-12-30 NOTE — Telephone Encounter (Signed)
The referral has been put in computer, pt is waiting on endo to call her.

## 2014-12-30 NOTE — Telephone Encounter (Signed)
Can you check on this referral for pt? 

## 2014-12-31 NOTE — Telephone Encounter (Signed)
Called [place] [facility] spoke with dawn states they are working on getting pt schedule will call the pt to schedule

## 2015-01-10 ENCOUNTER — Ambulatory Visit (INDEPENDENT_AMBULATORY_CARE_PROVIDER_SITE_OTHER): Payer: Medicare Other | Admitting: Endocrinology

## 2015-01-10 ENCOUNTER — Encounter: Payer: Self-pay | Admitting: Endocrinology

## 2015-01-10 VITALS — BP 124/86 | HR 80 | Temp 97.8°F | Ht 64.5 in | Wt 155.0 lb

## 2015-01-10 DIAGNOSIS — E669 Obesity, unspecified: Secondary | ICD-10-CM

## 2015-01-10 DIAGNOSIS — E1169 Type 2 diabetes mellitus with other specified complication: Secondary | ICD-10-CM

## 2015-01-10 DIAGNOSIS — E119 Type 2 diabetes mellitus without complications: Secondary | ICD-10-CM | POA: Diagnosis not present

## 2015-01-10 MED ORDER — INSULIN GLARGINE 100 UNIT/ML SOLOSTAR PEN: 15.0000 [IU] | PEN_INJECTOR | Freq: Every day | SUBCUTANEOUS | Status: DC

## 2015-01-10 MED ORDER — INSULIN ASPART 100 UNIT/ML FLEXPEN: 5.0000 [IU] | PEN_INJECTOR | Freq: Three times a day (TID) | SUBCUTANEOUS | Status: DC

## 2015-01-10 NOTE — Patient Instructions (Signed)
good diet and exercise habits significanly improve the control of your diabetes.  please let me know if you wish to be referred to a dietician.  high blood sugar is very risky to your health.  you should see an eye doctor and dentist every year.  It is very important to get all recommended vaccinations.  controlling your blood pressure and cholesterol drastically reduces the damage diabetes does to your body.  Those who smoke should quit.  please discuss these with your doctor.  controlling your blood pressure and cholesterol drastically reduces the damage diabetes does to your body.  Those who smoke should quit.  please discuss these with your doctor.  check your blood sugar twice a day.  vary the time of day when you check, between before the 3 meals, and at bedtime.  also check if you have symptoms of your blood sugar being too high or too low.  please keep a record of the readings and bring it to your next appointment here.  You can write it on any piece of paper.  please call us sooner if your blood sugar goes below 70, or if you have a lot of readings over 200. Please change your current insulin to: Lantus, 15 units at bedtime, and: novolog 5 units 3 times a day (just before each meal).  Please come back for a follow-up appointment in 1 month. Please call next week to report progress.

## 2015-01-10 NOTE — Progress Notes (Signed)
Subjective:    Patient ID: Isabel [name], female    DOB: Feb 06, 1956, 59 y.o.   MRN: [id number]  HPI pt states DM was dx'ed in 2011, when she presented with gallstone pancreatitis; she has mild neuropathy of the lower extremities, but she is unaware of any associated chronic complications; she has been on insulin since dx; pt says her diet is poor.  exercise is limited by health problems; she has never had GDM or severe hypoglycemia. She had 1 episode of DKA (2014).  Pt says she never misses the insulin.  She seldom checks cbg's.  She says it varies from 110-500.  There is no trend throughout the day.   Past Medical History  Diagnosis Date  . Allergy   . Asthma   . Depression   . GERD (gastroesophageal reflux disease)   . Hypertension   . Cystitis, interstitial   . Pneumonia   . Diabetes mellitus without complication   . Pancreatic pseudocyst   . Sleep apnea     wears a CPAP at night     Past Surgical History  Procedure Laterality Date  . Cystgastrostomy  09-02-12    per Dr. Cherly Hensen at [facility]   . Cholecystectomy      History   Social History  . Marital Status: Single    Spouse Name: N/A    Number of Children: N/A  . Years of Education: N/A   Occupational History  . Not on file.   Social History Main Topics  . Smoking status: Never Smoker   . Smokeless tobacco: Never Used  . Alcohol Use: No  . Drug Use: No  . Sexual Activity: No   Other Topics Concern  . Not on file   Social History Narrative    Current Outpatient Prescriptions on File Prior to Visit  Medication Sig Dispense Refill  . acetaminophen (TYLENOL) 325 MG tablet Take 2 tablets (650 mg total) by mouth every 6 (six) hours as needed for headache.    . albuterol (PROVENTIL HFA;VENTOLIN HFA) 108 (90 BASE) MCG/ACT inhaler Inhale 2 puffs into the lungs every 6 (six) hours as needed for wheezing or shortness of breath.    Marland Kitchen albuterol (PROVENTIL) (2.5 MG/3ML) 0.083% nebulizer solution Take 3 mLs (2.5 mg  total) by nebulization every 6 (six) hours as needed for wheezing or shortness of breath. 75 mL 12  . budesonide-formoterol (SYMBICORT) 160-4.5 MCG/ACT inhaler Inhale 2 puffs into the lungs 2 (two) times daily. For asthma 1 Inhaler 12  . cetirizine (ZYRTEC) 10 MG tablet Take 1 tablet (10 mg total) by mouth daily. For allergies 90 tablet 3  . desvenlafaxine (PRISTIQ) 50 MG 24 hr tablet Take 1 tablet (50 mg total) by mouth daily. For depression 30 tablet 5  . diazepam (VALIUM) 5 MG tablet Take 1 tablet (5 mg total) by mouth every 12 (twelve) hours as needed for anxiety. 60 tablet 5  . esomeprazole (NEXIUM) 40 MG capsule Take 1 capsule (40 mg total) by mouth daily. For acid reflux 30 capsule 3  . imipramine (TOFRANIL) 50 MG tablet Take 1 tablet (50 mg total) by mouth at bedtime. 30 tablet 5  . montelukast (SINGULAIR) 10 MG tablet Take 1 tablet (10 mg total) by mouth at bedtime. For asthma 90 tablet 3  . NASONEX 50 MCG/ACT nasal spray INSTILL 2 SPRAYS IN EACH NOSTRIL DAILY 51 g 3  . nystatin cream (MYCOSTATIN) Apply 1 application topically 2 (two) times daily as needed for dry skin (rash). 30  g 0  . oxybutynin (DITROPAN-XL) 10 MG 24 hr tablet Take 1 tablet (10 mg total) by mouth daily. For bladder spasm 30 tablet 11  . RELION SHORT PEN NEEDLES 31G X 8 MM MISC USE AS DIRECTED 100 each 0  . rOPINIRole (REQUIP) 1 MG tablet Take 1 tablet (1 mg total) by mouth at bedtime. For restless leg syndrome    . traZODone (DESYREL) 100 MG tablet Take 1 tablet (100 mg total) by mouth at bedtime. 30 tablet 5   No current facility-administered medications on file prior to visit.    Allergies  Allergen Reactions  . Aspirin     REACTION: unspecified  . Latex     rash  . Penicillins     REACTION: unspecified  . Povidone-Iodine     REACTION: unspecified  . Sulfamethoxazole     REACTION: unspecified    Family History  Problem Relation Age of Onset  . Cancer      other    BP 124/86 mmHg  Pulse 80   Temp(Src) 97.8 F (36.6 C) (Oral)  Ht 5' 4.5" (1.638 m)  Wt 155 lb (70.308 kg)  BMI 26.20 kg/m2  SpO2 97%  Review of Systems denies weight loss, blurry vision, chest pain, sob, n/v, urinary frequency, excessive diaphoresis, and cold intolerance.  She has intermittent headache, depression, rhinorrhea, easy bruising, and leg cramps    Objective:   Physical Exam VS: see vs page GEN: no distress Pulses: dorsalis pedis intact bilat.   MSK: no deformity of the feet CV: no leg edema Skin:  no ulcer on the feet.  normal color and temp on the feet. Neuro: sensation is intact to touch on the feet, but decreased from normal.  Ext: There is bilateral onychomycosis of the toenails.   Lab Results  Component Value Date   HGBA1C 12.4 Repeated and verified X2.* 12/10/2014       Assessment & Plan:  DM: severe exacerbation. Social situation such that she cannot travel here very often.  Cousin (who brings pt and also provides hx), say she cannot bring pt back to [place] for another month.   Depression: new to me: this can complicate the rx of DM.  Pt is advised to please continue the same medication for this.     Patient is advised the following: Patient Instructions  good diet and exercise habits significanly improve the control of your diabetes.  please let me know if you wish to be referred to a dietician.  high blood sugar is very risky to your health.  you should see an eye doctor and dentist every year.  It is very important to get all recommended vaccinations.  controlling your blood pressure and cholesterol drastically reduces the damage diabetes does to your body.  Those who smoke should quit.  please discuss these with your doctor.  controlling your blood pressure and cholesterol drastically reduces the damage diabetes does to your body.  Those who smoke should quit.  please discuss these with your doctor.  check your blood sugar twice a day.  vary the time of day when you check,  between before the 3 meals, and at bedtime.  also check if you have symptoms of your blood sugar being too high or too low.  please keep a record of the readings and bring it to your next appointment here.  You can write it on any piece of paper.  please call us sooner if your blood sugar goes below 70, or if  you have a lot of readings over 200. Please change your current insulin to: Lantus, 15 units at bedtime, and: novolog 5 units 3 times a day (just before each meal).  Please come back for a follow-up appointment in 1 month. Please call next week to report progress.

## 2015-02-06 ENCOUNTER — Ambulatory Visit (INDEPENDENT_AMBULATORY_CARE_PROVIDER_SITE_OTHER): Payer: Medicare Other | Admitting: Family Medicine

## 2015-02-06 ENCOUNTER — Encounter: Payer: Self-pay | Admitting: Family Medicine

## 2015-02-06 ENCOUNTER — Ambulatory Visit (INDEPENDENT_AMBULATORY_CARE_PROVIDER_SITE_OTHER): Payer: Medicare Other | Admitting: Endocrinology

## 2015-02-06 ENCOUNTER — Encounter: Payer: Self-pay | Admitting: Endocrinology

## 2015-02-06 VITALS — BP 134/94 | HR 77 | Temp 97.8°F | Ht 64.5 in | Wt 160.0 lb

## 2015-02-06 VITALS — BP 148/92 | HR 75 | Temp 98.1°F | Ht 64.5 in | Wt 161.0 lb

## 2015-02-06 DIAGNOSIS — E119 Type 2 diabetes mellitus without complications: Secondary | ICD-10-CM | POA: Diagnosis not present

## 2015-02-06 DIAGNOSIS — F70 Mild intellectual disabilities: Secondary | ICD-10-CM | POA: Diagnosis not present

## 2015-02-06 DIAGNOSIS — E1169 Type 2 diabetes mellitus with other specified complication: Secondary | ICD-10-CM

## 2015-02-06 DIAGNOSIS — E669 Obesity, unspecified: Secondary | ICD-10-CM | POA: Diagnosis not present

## 2015-02-06 DIAGNOSIS — S29011A Strain of muscle and tendon of front wall of thorax, initial encounter: Secondary | ICD-10-CM | POA: Diagnosis not present

## 2015-02-06 MED ORDER — INSULIN GLARGINE 100 UNIT/ML SOLOSTAR PEN: 10.0000 [IU] | PEN_INJECTOR | Freq: Every day | SUBCUTANEOUS | Status: DC

## 2015-02-06 MED ORDER — INSULIN ASPART 100 UNIT/ML FLEXPEN: 7.0000 [IU] | PEN_INJECTOR | Freq: Three times a day (TID) | SUBCUTANEOUS | Status: DC

## 2015-02-06 NOTE — Progress Notes (Signed)
Pre visit review using our clinic review tool, if applicable. No additional management support is needed unless otherwise documented below in the visit note. 

## 2015-02-06 NOTE — Progress Notes (Signed)
   Subjective:    Patient ID: Isabel [name], female    DOB: [birth year], 57 y.o.   MRN: [id number]  HPI Here for several issues. Here with her cousin as usual. For the past few days she has had sharp pains in the right side and she thinks this came from lifting a heavy box at home. No SOB. The pain is worst when she takes a deep breath or twists her trunk. 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 She would benefit from getting out of her house on a daily basis to pursue activities with other adults since she lives alone.   Review of Systems  Constitutional: Negative.   Respiratory: Negative.   Cardiovascular: Positive for chest pain. Negative for palpitations and leg swelling.  Neurological: Negative.   Psychiatric/Behavioral: Positive for dysphoric mood. Negative for behavioral problems, confusion, decreased concentration and agitation. The patient is nervous/anxious.        Objective:   Physical Exam  Constitutional: She is oriented to person, place, and time. She appears well-nourished.  Neck: No thyromegaly present.  Cardiovascular: Normal rate, regular rhythm, normal heart sounds and intact distal pulses.   Pulmonary/Chest: Effort normal and breath sounds normal. No respiratory distress. She has no wheezes. She has no rales.  She is tender in a broad band around the right side at the lower rib margin, no crepitus or ecchymosis   Lymphadenopathy:    She has no cervical adenopathy.  Neurological: She is alert and oriented to person, place, and time.  Psychiatric: She has a normal mood and affect. Her behavior is normal. Thought content normal.          Assessment & Plan:  She has pulled some trunk muscles and they will resolve over time. Use Advil and heat prn. We will refer her [place] a [place] [facility] agency [place] social work consult, to see how to get her enrolled in an adult day care program

## 2015-02-06 NOTE — Patient Instructions (Addendum)
check your blood sugar twice a day.  vary the time of day when you check, between before the 3 meals, and at bedtime.  also check if you have symptoms of your blood sugar being too high or too low.  please keep a record of the readings and bring it to your next appointment here.  You can write it on any piece of paper.  please call us sooner if your blood sugar goes below 70, or if you have a lot of readings over 200. Please reduce the lantus to 10 units at bedtime, and:  novolog 7 units 3 times a day (just before each meal).  Please come back for a follow-up appointment on 03/10/15.

## 2015-02-06 NOTE — Progress Notes (Signed)
Subjective:    Patient ID: Isabel [name], female    DOB: [birth year], 59 y.o.   MRN: [id number]  HPI  Pt returns for f/u of diabetes mellitus: DM type: 1 Dx'ed: 2011, when she presented with gallstone pancreatitis Complications: polyneuropathy. Therapy: insulin since dx GDM: never DKA: once (2014) Severe hypoglycemia: never Pancreatitis: never Other: she takes multiple daily injections.  She declines pump and continuous glucose monitor Interval history: no cbg record, but states cbg's vary from 110-350, but most are in the 100's.  It is in general higher as the day goes on.  Pt says she never misses the insulin. Past Medical History  Diagnosis Date  . Allergy   . Asthma   . Depression   . GERD (gastroesophageal reflux disease)   . Hypertension   . Cystitis, interstitial   . Pneumonia   . Diabetes mellitus without complication   . Pancreatic pseudocyst   . Sleep apnea     wears a CPAP at night     Past Surgical History  Procedure Laterality Date  . Cystgastrostomy  09-02-12    per Dr. Cherly [facility]ensen at [facility]   . Cholecystectomy      History   Social History  . Marital Status: Single    Spouse Name: N/A  . Number of Children: N/A  . Years of Education: N/A   Occupational History  . Not on file.   Social History Main Topics  . Smoking status: Never Smoker   . Smokeless tobacco: Never Used  . Alcohol Use: No  . Drug Use: No  . Sexual Activity: No   Other Topics Concern  . Not on file   Social History Narrative    Current Outpatient Prescriptions on File Prior to Visit  Medication Sig Dispense Refill  . acetaminophen (TYLENOL) 325 MG tablet Take 2 tablets (650 mg total) by mouth every 6 (six) hours as needed for headache.    . albuterol (PROVENTIL HFA;VENTOLIN HFA) 108 (90 BASE) MCG/ACT inhaler Inhale 2 puffs into the lungs every 6 (six) hours as needed for wheezing or shortness of breath.    Marland Kitchen albuterol (PROVENTIL) (2.5 MG/3ML) 0.083% nebulizer solution Take  3 mLs (2.5 mg total) by nebulization every 6 (six) hours as needed for wheezing or shortness of breath. 75 mL 12  . budesonide-formoterol (SYMBICORT) 160-4.5 MCG/ACT inhaler Inhale 2 puffs into the lungs 2 (two) times daily. For asthma 1 Inhaler 12  . cetirizine (ZYRTEC) 10 MG tablet Take 1 tablet (10 mg total) by mouth daily. For allergies 90 tablet 3  . desvenlafaxine (PRISTIQ) 50 MG 24 hr tablet Take 1 tablet (50 mg total) by mouth daily. For depression 30 tablet 5  . diazepam (VALIUM) 5 MG tablet Take 1 tablet (5 mg total) by mouth every 12 (twelve) hours as needed for anxiety. 60 tablet 5  . esomeprazole (NEXIUM) 40 MG capsule Take 1 capsule (40 mg total) by mouth daily. For acid reflux 30 capsule 3  . imipramine (TOFRANIL) 50 MG tablet Take 1 tablet (50 mg total) by mouth at bedtime. 30 tablet 5  . montelukast (SINGULAIR) 10 MG tablet Take 1 tablet (10 mg total) by mouth at bedtime. For asthma 90 tablet 3  . NASONEX 50 MCG/ACT nasal spray INSTILL 2 SPRAYS IN EACH NOSTRIL DAILY 51 g 3  . nystatin cream (MYCOSTATIN) Apply 1 application topically 2 (two) times daily as needed for dry skin (rash). 30 g 0  . oxybutynin (DITROPAN-XL) 10 MG 24 hr tablet  Take 1 tablet (10 mg total) by mouth daily. For bladder spasm 30 tablet 11  . RELION SHORT PEN NEEDLES 31G X 8 MM MISC USE AS DIRECTED 100 each 0  . rOPINIRole (REQUIP) 1 MG tablet Take 1 tablet (1 mg total) by mouth at bedtime. For restless leg syndrome    . traZODone (DESYREL) 100 MG tablet Take 1 tablet (100 mg total) by mouth at bedtime. 30 tablet 5   No current facility-administered medications on file prior to visit.    Allergies  Allergen Reactions  . Aspirin     REACTION: unspecified  . Latex     rash  . Penicillins     REACTION: unspecified  . Povidone-Iodine     REACTION: unspecified  . Sulfamethoxazole     REACTION: unspecified    Family History  Problem Relation Age of Onset  . Cancer      other    BP 134/94 mmHg   Pulse 77  Temp(Src) 97.8 F (36.6 C) (Oral)  Ht 5' 4.5" (1.638 m)  Wt 160 lb (72.576 kg)  BMI 27.05 kg/m2  SpO2 97%    Review of Systems She denies hypoglycemia.     Objective:   Physical Exam VITAL SIGNS:  See vs page GENERAL: no distress PSYCH: Alert and well-oriented.  Does not appear anxious nor depressed.        Assessment & Plan:  DM: Based on the pattern of her cbg's, she needs some adjustment in her therapy  Patient is advised the following: Patient Instructions  check your blood sugar twice a day.  vary the time of day when you check, between before the 3 meals, and at bedtime.  also check if you have symptoms of your blood sugar being too high or too low.  please keep a record of the readings and bring it to your next appointment here.  You can write it on any piece of paper.  please call us sooner if your blood sugar goes below 70, or if you have a lot of readings over 200. Please reduce the lantus to 10 units at bedtime, and:  novolog 7 units 3 times a day (just before each meal).  Please come back for a follow-up appointment on 03/10/15.

## 2015-02-07 ENCOUNTER — Telehealth: Payer: Self-pay | Admitting: Family Medicine

## 2015-02-07 NOTE — Telephone Encounter (Signed)
I sent pt a may chart message, we do not have a phone number listed for pt. Please see note attached to my chart.

## 2015-02-07 NOTE — Telephone Encounter (Signed)
FYI Jenniifer from [facility] is trying to get in touch with pt for nursing and social worker eval. Victorino DikeJennifer will try to reach pt tomorrow

## 2015-02-07 NOTE — Telephone Encounter (Signed)
I spoke with Eunice Blaseebbie from [facility] and they are going to send out a nurse to evaluate.

## 2015-02-10 DIAGNOSIS — F79 Unspecified intellectual disabilities: Secondary | ICD-10-CM | POA: Diagnosis not present

## 2015-02-10 DIAGNOSIS — F70 Mild intellectual disabilities: Secondary | ICD-10-CM | POA: Diagnosis not present

## 2015-02-10 DIAGNOSIS — R32 Unspecified urinary incontinence: Secondary | ICD-10-CM | POA: Diagnosis not present

## 2015-02-10 DIAGNOSIS — J449 Chronic obstructive pulmonary disease, unspecified: Secondary | ICD-10-CM | POA: Diagnosis not present

## 2015-02-10 DIAGNOSIS — Z794 Long term (current) use of insulin: Secondary | ICD-10-CM | POA: Diagnosis not present

## 2015-02-10 DIAGNOSIS — E119 Type 2 diabetes mellitus without complications: Secondary | ICD-10-CM | POA: Diagnosis not present

## 2015-02-10 NOTE — Telephone Encounter (Signed)
noted 

## 2015-02-11 ENCOUNTER — Telehealth: Payer: Self-pay | Admitting: Family Medicine

## 2015-02-11 NOTE — Telephone Encounter (Signed)
Melina CopaSarah rn  saw pt yesterday for home health they will be working on dm and medication

## 2015-02-11 NOTE — Telephone Encounter (Signed)
noted 

## 2015-02-13 DIAGNOSIS — J449 Chronic obstructive pulmonary disease, unspecified: Secondary | ICD-10-CM | POA: Diagnosis not present

## 2015-02-13 DIAGNOSIS — Z794 Long term (current) use of insulin: Secondary | ICD-10-CM | POA: Diagnosis not present

## 2015-02-13 DIAGNOSIS — F70 Mild intellectual disabilities: Secondary | ICD-10-CM | POA: Diagnosis not present

## 2015-02-13 DIAGNOSIS — R32 Unspecified urinary incontinence: Secondary | ICD-10-CM | POA: Diagnosis not present

## 2015-02-13 DIAGNOSIS — E119 Type 2 diabetes mellitus without complications: Secondary | ICD-10-CM | POA: Diagnosis not present

## 2015-02-13 DIAGNOSIS — F79 Unspecified intellectual disabilities: Secondary | ICD-10-CM | POA: Diagnosis not present

## 2015-02-14 DIAGNOSIS — E119 Type 2 diabetes mellitus without complications: Secondary | ICD-10-CM | POA: Diagnosis not present

## 2015-02-14 DIAGNOSIS — R32 Unspecified urinary incontinence: Secondary | ICD-10-CM | POA: Diagnosis not present

## 2015-02-14 DIAGNOSIS — F79 Unspecified intellectual disabilities: Secondary | ICD-10-CM | POA: Diagnosis not present

## 2015-02-14 DIAGNOSIS — J449 Chronic obstructive pulmonary disease, unspecified: Secondary | ICD-10-CM | POA: Diagnosis not present

## 2015-02-14 DIAGNOSIS — F70 Mild intellectual disabilities: Secondary | ICD-10-CM | POA: Diagnosis not present

## 2015-02-14 DIAGNOSIS — Z794 Long term (current) use of insulin: Secondary | ICD-10-CM | POA: Diagnosis not present

## 2015-02-20 ENCOUNTER — Telehealth: Payer: Self-pay

## 2015-02-20 DIAGNOSIS — Z9889 Other specified postprocedural states: Secondary | ICD-10-CM | POA: Diagnosis not present

## 2015-02-20 DIAGNOSIS — F70 Mild intellectual disabilities: Secondary | ICD-10-CM | POA: Diagnosis not present

## 2015-02-20 DIAGNOSIS — J449 Chronic obstructive pulmonary disease, unspecified: Secondary | ICD-10-CM | POA: Diagnosis not present

## 2015-02-20 DIAGNOSIS — E119 Type 2 diabetes mellitus without complications: Secondary | ICD-10-CM | POA: Diagnosis not present

## 2015-02-20 DIAGNOSIS — I10 Essential (primary) hypertension: Secondary | ICD-10-CM | POA: Diagnosis not present

## 2015-02-20 DIAGNOSIS — F329 Major depressive disorder, single episode, unspecified: Secondary | ICD-10-CM | POA: Diagnosis not present

## 2015-02-20 DIAGNOSIS — F29 Unspecified psychosis not due to a substance or known physiological condition: Secondary | ICD-10-CM | POA: Diagnosis not present

## 2015-02-20 DIAGNOSIS — Z880 Allergy status to penicillin: Secondary | ICD-10-CM | POA: Diagnosis not present

## 2015-02-20 DIAGNOSIS — G4733 Obstructive sleep apnea (adult) (pediatric): Secondary | ICD-10-CM | POA: Diagnosis not present

## 2015-02-20 DIAGNOSIS — Z79899 Other long term (current) drug therapy: Secondary | ICD-10-CM | POA: Diagnosis not present

## 2015-02-20 DIAGNOSIS — F79 Unspecified intellectual disabilities: Secondary | ICD-10-CM | POA: Diagnosis not present

## 2015-02-20 DIAGNOSIS — Z794 Long term (current) use of insulin: Secondary | ICD-10-CM | POA: Diagnosis not present

## 2015-02-20 DIAGNOSIS — J45909 Unspecified asthma, uncomplicated: Secondary | ICD-10-CM | POA: Diagnosis not present

## 2015-02-20 DIAGNOSIS — Z87891 Personal history of nicotine dependence: Secondary | ICD-10-CM | POA: Diagnosis not present

## 2015-02-20 DIAGNOSIS — F4310 Post-traumatic stress disorder, unspecified: Secondary | ICD-10-CM | POA: Diagnosis not present

## 2015-02-20 DIAGNOSIS — S71112A Laceration without foreign body, left thigh, initial encounter: Secondary | ICD-10-CM | POA: Diagnosis not present

## 2015-02-20 DIAGNOSIS — F321 Major depressive disorder, single episode, moderate: Secondary | ICD-10-CM | POA: Diagnosis not present

## 2015-02-20 DIAGNOSIS — R32 Unspecified urinary incontinence: Secondary | ICD-10-CM | POA: Diagnosis not present

## 2015-02-20 DIAGNOSIS — Z9049 Acquired absence of other specified parts of digestive tract: Secondary | ICD-10-CM | POA: Diagnosis not present

## 2015-02-20 DIAGNOSIS — Z888 Allergy status to other drugs, medicaments and biological substances status: Secondary | ICD-10-CM | POA: Diagnosis not present

## 2015-02-20 DIAGNOSIS — Z91040 Latex allergy status: Secondary | ICD-10-CM | POA: Diagnosis not present

## 2015-02-20 DIAGNOSIS — R45851 Suicidal ideations: Secondary | ICD-10-CM | POA: Diagnosis not present

## 2015-02-20 DIAGNOSIS — F0390 Unspecified dementia without behavioral disturbance: Secondary | ICD-10-CM | POA: Diagnosis not present

## 2015-02-20 DIAGNOSIS — F419 Anxiety disorder, unspecified: Secondary | ICD-10-CM | POA: Diagnosis not present

## 2015-02-20 DIAGNOSIS — Z882 Allergy status to sulfonamides status: Secondary | ICD-10-CM | POA: Diagnosis not present

## 2015-02-20 NOTE — Telephone Encounter (Signed)
Rolly PancakeMichelle Jones RN from [facility] called and states that since last visit pt has had thoughts of suicide.  Today pt is tearful and has a cut on the side of her leg where she tried to cut herself.  Per Dr. Corinna GabHunter Michelle Jones should call 911.  Rolly PancakeMichelle Jones verbalized understanding.

## 2015-02-21 NOTE — Telephone Encounter (Signed)
FYI

## 2015-02-21 NOTE — Telephone Encounter (Signed)
Sp[facility] to [facility]ard at [facility] stating patient was there with concerns of SI/HI and they wanted to know if this was new. Called MD Clent RidgesFry to ask about patient's mental health. MD Clent RidgesFry stated patient is labile and unpredictable and to take her SI/HI statements very seriously. Called Howard back at [facility] to make aware of MD Fry's assessment.

## 2015-02-22 DIAGNOSIS — F79 Unspecified intellectual disabilities: Secondary | ICD-10-CM | POA: Diagnosis not present

## 2015-02-22 DIAGNOSIS — R32 Unspecified urinary incontinence: Secondary | ICD-10-CM | POA: Diagnosis not present

## 2015-02-22 DIAGNOSIS — J449 Chronic obstructive pulmonary disease, unspecified: Secondary | ICD-10-CM | POA: Diagnosis not present

## 2015-02-22 DIAGNOSIS — E119 Type 2 diabetes mellitus without complications: Secondary | ICD-10-CM | POA: Diagnosis not present

## 2015-02-22 DIAGNOSIS — Z794 Long term (current) use of insulin: Secondary | ICD-10-CM | POA: Diagnosis not present

## 2015-02-22 DIAGNOSIS — F70 Mild intellectual disabilities: Secondary | ICD-10-CM | POA: Diagnosis not present

## 2015-02-23 ENCOUNTER — Encounter: Payer: Self-pay | Admitting: Family Medicine

## 2015-02-25 DIAGNOSIS — J449 Chronic obstructive pulmonary disease, unspecified: Secondary | ICD-10-CM | POA: Diagnosis not present

## 2015-02-25 DIAGNOSIS — F79 Unspecified intellectual disabilities: Secondary | ICD-10-CM | POA: Diagnosis not present

## 2015-02-25 DIAGNOSIS — F70 Mild intellectual disabilities: Secondary | ICD-10-CM | POA: Diagnosis not present

## 2015-02-25 DIAGNOSIS — R32 Unspecified urinary incontinence: Secondary | ICD-10-CM | POA: Diagnosis not present

## 2015-02-25 DIAGNOSIS — Z794 Long term (current) use of insulin: Secondary | ICD-10-CM | POA: Diagnosis not present

## 2015-02-25 DIAGNOSIS — E119 Type 2 diabetes mellitus without complications: Secondary | ICD-10-CM | POA: Diagnosis not present

## 2015-02-25 NOTE — Telephone Encounter (Signed)
Yes - that would be fine

## 2015-03-03 DIAGNOSIS — R32 Unspecified urinary incontinence: Secondary | ICD-10-CM | POA: Diagnosis not present

## 2015-03-03 DIAGNOSIS — F79 Unspecified intellectual disabilities: Secondary | ICD-10-CM | POA: Diagnosis not present

## 2015-03-03 DIAGNOSIS — J449 Chronic obstructive pulmonary disease, unspecified: Secondary | ICD-10-CM | POA: Diagnosis not present

## 2015-03-03 DIAGNOSIS — E119 Type 2 diabetes mellitus without complications: Secondary | ICD-10-CM | POA: Diagnosis not present

## 2015-03-03 DIAGNOSIS — Z794 Long term (current) use of insulin: Secondary | ICD-10-CM | POA: Diagnosis not present

## 2015-03-03 DIAGNOSIS — F70 Mild intellectual disabilities: Secondary | ICD-10-CM | POA: Diagnosis not present

## 2015-03-10 ENCOUNTER — Ambulatory Visit: Payer: Self-pay | Admitting: Endocrinology

## 2015-03-11 DIAGNOSIS — E119 Type 2 diabetes mellitus without complications: Secondary | ICD-10-CM | POA: Diagnosis not present

## 2015-03-11 DIAGNOSIS — F70 Mild intellectual disabilities: Secondary | ICD-10-CM | POA: Diagnosis not present

## 2015-03-11 DIAGNOSIS — J449 Chronic obstructive pulmonary disease, unspecified: Secondary | ICD-10-CM | POA: Diagnosis not present

## 2015-03-11 DIAGNOSIS — R32 Unspecified urinary incontinence: Secondary | ICD-10-CM | POA: Diagnosis not present

## 2015-03-12 ENCOUNTER — Ambulatory Visit (INDEPENDENT_AMBULATORY_CARE_PROVIDER_SITE_OTHER): Payer: Medicare Other | Admitting: Endocrinology

## 2015-03-12 ENCOUNTER — Encounter: Payer: Self-pay | Admitting: Endocrinology

## 2015-03-12 ENCOUNTER — Ambulatory Visit (INDEPENDENT_AMBULATORY_CARE_PROVIDER_SITE_OTHER): Payer: Medicare Other | Admitting: Family Medicine

## 2015-03-12 ENCOUNTER — Encounter: Payer: Self-pay | Admitting: Family Medicine

## 2015-03-12 VITALS — BP 140/85 | HR 64 | Ht 64.5 in | Wt 161.0 lb

## 2015-03-12 VITALS — BP 120/84 | HR 54 | Temp 97.9°F | Ht 64.5 in | Wt 159.0 lb

## 2015-03-12 DIAGNOSIS — E1042 Type 1 diabetes mellitus with diabetic polyneuropathy: Secondary | ICD-10-CM | POA: Diagnosis not present

## 2015-03-12 DIAGNOSIS — F331 Major depressive disorder, recurrent, moderate: Secondary | ICD-10-CM | POA: Diagnosis not present

## 2015-03-12 DIAGNOSIS — F4310 Post-traumatic stress disorder, unspecified: Secondary | ICD-10-CM | POA: Diagnosis not present

## 2015-03-12 LAB — HEMOGLOBIN A1C: Hgb A1c MFr Bld: 9.7 % — ABNORMAL HIGH (ref 4.6–6.5)

## 2015-03-12 MED ORDER — DESVENLAFAXINE SUCCINATE ER 100 MG PO TB24: 100.0000 mg | ORAL_TABLET | Freq: Every day | ORAL | Status: DC

## 2015-03-12 NOTE — Progress Notes (Signed)
Subjective:    Patient ID: Isabel [name], female    DOB: 06-May-1956, 58 y.o.   MRN: [id number]  HPI Pt returns for f/u of diabetes mellitus: DM type: 1 Dx'ed: 2011, when she presented with gallstone pancreatitis Complications: polyneuropathy. Therapy: insulin since dx GDM: never DKA: once (2014) Severe hypoglycemia: never Pancreatitis: only at dx Other: she takes multiple daily injections.  She declines pump and continuous glucose monitor Interval history:  Pt says she never misses the insulin.  no cbg record, but states cbg's are well-controlled.  Her meter is broken.   Past Medical History  Diagnosis Date  . Allergy   . Asthma   . Depression   . GERD (gastroesophageal reflux disease)   . Hypertension   . Cystitis, interstitial   . Pneumonia   . Diabetes mellitus without complication   . Pancreatic pseudocyst   . Sleep apnea     wears a CPAP at night     Past Surgical History  Procedure Laterality Date  . Cystgastrostomy  09-02-12    per Dr. Cherly [facility] at [facility]   . Cholecystectomy      History   Social History  . Marital Status: Single    Spouse Name: N/A  . Number of Children: N/A  . Years of Education: N/A   Occupational History  . Not on file.   Social History Main Topics  . Smoking status: Never Smoker   . Smokeless tobacco: Never Used  . Alcohol Use: No  . Drug Use: No  . Sexual Activity: No   Other Topics Concern  . Not on file   Social History Narrative    Current Outpatient Prescriptions on File Prior to Visit  Medication Sig Dispense Refill  . acetaminophen (TYLENOL) 325 MG tablet Take 2 tablets (650 mg total) by mouth every 6 (six) hours as needed for headache.    . albuterol (PROVENTIL HFA;VENTOLIN HFA) 108 (90 BASE) MCG/ACT inhaler Inhale 2 puffs into the lungs every 6 (six) hours as needed for wheezing or shortness of breath.    Marland Kitchen albuterol (PROVENTIL) (2.5 MG/3ML) 0.083% nebulizer solution Take 3 mLs (2.5 mg total) by nebulization  every 6 (six) hours as needed for wheezing or shortness of breath. 75 mL 12  . budesonide-formoterol (SYMBICORT) 160-4.5 MCG/ACT inhaler Inhale 2 puffs into the lungs 2 (two) times daily. For asthma 1 Inhaler 12  . cetirizine (ZYRTEC) 10 MG tablet Take 1 tablet (10 mg total) by mouth daily. For allergies 90 tablet 3  . diazepam (VALIUM) 5 MG tablet Take 1 tablet (5 mg total) by mouth every 12 (twelve) hours as needed for anxiety. 60 tablet 5  . esomeprazole (NEXIUM) 40 MG capsule Take 1 capsule (40 mg total) by mouth daily. For acid reflux 30 capsule 3  . imipramine (TOFRANIL) 50 MG tablet Take 1 tablet (50 mg total) by mouth at bedtime. 30 tablet 5  . insulin aspart (NOVOLOG FLEXPEN) 100 UNIT/ML FlexPen Inject 7 Units into the skin 3 (three) times daily with meals. And pen needles 4/day (Patient taking differently: Inject 10 Units into the skin 3 (three) times daily with meals. And pen needles 4/day) 15 mL 11  . Insulin Glargine (LANTUS SOLOSTAR) 100 UNIT/ML Solostar Pen Inject 10 Units into the skin at bedtime. 5 pen PRN  . montelukast (SINGULAIR) 10 MG tablet Take 1 tablet (10 mg total) by mouth at bedtime. For asthma 90 tablet 3  . NASONEX 50 MCG/ACT nasal spray INSTILL 2 SPRAYS IN [facility]  NOSTRIL DAILY 51 g 3  . nystatin cream (MYCOSTATIN) Apply 1 application topically 2 (two) times daily as needed for dry skin (rash). 30 g 0  . oxybutynin (DITROPAN-XL) 10 MG 24 hr tablet Take 1 tablet (10 mg total) by mouth daily. For bladder spasm 30 tablet 11  . RELION SHORT PEN NEEDLES 31G X 8 MM MISC USE AS DIRECTED 100 each 0  . rOPINIRole (REQUIP) 1 MG tablet Take 1 tablet (1 mg total) by mouth at bedtime. For restless leg syndrome    . traZODone (DESYREL) 100 MG tablet Take 1 tablet (100 mg total) by mouth at bedtime. 30 tablet 5   No current facility-administered medications on file prior to visit.    Allergies  Allergen Reactions  . Aspirin     REACTION: unspecified  . Latex     rash  .  Penicillins     REACTION: unspecified  . Povidone-Iodine     REACTION: unspecified  . Sulfamethoxazole     REACTION: unspecified    Family History  Problem Relation Age of Onset  . Cancer      other    BP 120/84 mmHg  Pulse 54  Temp(Src) 97.9 F (36.6 C) (Oral)  Ht 5' 4.5" (1.638 m)  Wt 159 lb (72.122 kg)  BMI 26.88 kg/m2  SpO2 98%    Review of Systems She denies hypoglycemia.  She has gained a few lbs.    Objective:   Physical Exam VITAL SIGNS:  See vs page GENERAL: no distress Pulses: dorsalis pedis intact bilat.   MSK: no deformity of the feet CV: no leg edema Skin:  no ulcer on the feet.  normal color and temp on the feet. Neuro: sensation is intact to touch on the feet  Lab Results  Component Value Date   HGBA1C 9.7* 03/12/2015       Assessment & Plan:  DM: improved, but she needs increased rx. Noncompliance with cbg recording, persistent: I'll work around this as best I can   Patient is advised the following: Patient Instructions  check your blood sugar twice a day.  vary the time of day when you check, between before the 3 meals, and at bedtime.  also check if you have symptoms of your blood sugar being too high or too low.  please keep a record of the readings and bring it to your next appointment here.  You can write it on any piece of paper.  please call us sooner if your blood sugar goes below 70, or if you have a lot of readings over 200. Here is a list of providers who can get you free test strips.  Please call, and they will mail to you. blood tests are requested for you today.  We'll let you know about the results. Please come back for a follow-up appointment in 3 months.     addendum: increase the novolog to 10 units 3 times a day (just before each meal).

## 2015-03-12 NOTE — Patient Instructions (Addendum)
check your blood sugar twice a day.  vary the time of day when you check, between before the 3 meals, and at bedtime.  also check if you have symptoms of your blood sugar being too high or too low.  please keep a record of the readings and bring it to your next appointment here.  You can write it on any piece of paper.  please call us sooner if your blood sugar goes below 70, or if you have a lot of readings over 200. Here is a list of providers who can get you free test strips.  Please call, and they will mail to you. blood tests are requested for you today.  We'll let you know about the results. Please come back for a follow-up appointment in 3 months.

## 2015-03-12 NOTE — Progress Notes (Signed)
Pre visit review using our clinic review tool, if applicable. No additional management support is needed unless otherwise documented below in the visit note. 

## 2015-03-12 NOTE — Progress Notes (Signed)
   Subjective:    Patient ID: Isabel [name], female    DOB: 1956-02-05, 59 y.o.   MRN: [id number]  HPI Here to follow up an overnight stay at [facility] from 02-20-15 to 02-21-15 for suicidal ideation and depression. She seemed to stabilize quickly and she was sent back home to follow up with us. None of her meds were changed. Since then she has felt well and denies any suicidal thoughts at all. She sleeps well. She had a visit with Dr. Everardo AllEllison today.    Review of Systems  Constitutional: Negative.   Respiratory: Negative.   Cardiovascular: Negative.   Neurological: Negative.   Psychiatric/Behavioral: Negative.        Objective:   Physical Exam  Constitutional: She is oriented to person, place, and time. She appears well-developed and well-nourished. No distress.  Neurological: She is alert and oriented to person, place, and time.  Psychiatric: She has a normal mood and affect. Her behavior is normal. Judgment and thought content normal.          Assessment & Plan:  She seems to be stable now but we agreed to increase the Pristiq to 100 mg daily. Recheck in one month

## 2015-03-20 DIAGNOSIS — R32 Unspecified urinary incontinence: Secondary | ICD-10-CM | POA: Diagnosis not present

## 2015-03-20 DIAGNOSIS — Z794 Long term (current) use of insulin: Secondary | ICD-10-CM | POA: Diagnosis not present

## 2015-03-20 DIAGNOSIS — E119 Type 2 diabetes mellitus without complications: Secondary | ICD-10-CM | POA: Diagnosis not present

## 2015-03-20 DIAGNOSIS — F79 Unspecified intellectual disabilities: Secondary | ICD-10-CM | POA: Diagnosis not present

## 2015-03-20 DIAGNOSIS — J449 Chronic obstructive pulmonary disease, unspecified: Secondary | ICD-10-CM | POA: Diagnosis not present

## 2015-03-20 DIAGNOSIS — F70 Mild intellectual disabilities: Secondary | ICD-10-CM | POA: Diagnosis not present

## 2015-03-25 ENCOUNTER — Other Ambulatory Visit: Payer: Self-pay | Admitting: Family Medicine

## 2015-03-27 ENCOUNTER — Encounter: Payer: Self-pay | Admitting: Family Medicine

## 2015-03-28 NOTE — Telephone Encounter (Signed)
I can only assume that this means the approval time is running out. Someone has to pay for this of course.

## 2015-04-01 DIAGNOSIS — J449 Chronic obstructive pulmonary disease, unspecified: Secondary | ICD-10-CM | POA: Diagnosis not present

## 2015-04-01 DIAGNOSIS — Z794 Long term (current) use of insulin: Secondary | ICD-10-CM | POA: Diagnosis not present

## 2015-04-01 DIAGNOSIS — F79 Unspecified intellectual disabilities: Secondary | ICD-10-CM | POA: Diagnosis not present

## 2015-04-01 DIAGNOSIS — R32 Unspecified urinary incontinence: Secondary | ICD-10-CM | POA: Diagnosis not present

## 2015-04-01 DIAGNOSIS — E119 Type 2 diabetes mellitus without complications: Secondary | ICD-10-CM | POA: Diagnosis not present

## 2015-04-01 DIAGNOSIS — F70 Mild intellectual disabilities: Secondary | ICD-10-CM | POA: Diagnosis not present

## 2015-04-07 DIAGNOSIS — E119 Type 2 diabetes mellitus without complications: Secondary | ICD-10-CM | POA: Diagnosis not present

## 2015-04-07 DIAGNOSIS — Z794 Long term (current) use of insulin: Secondary | ICD-10-CM | POA: Diagnosis not present

## 2015-04-07 DIAGNOSIS — F70 Mild intellectual disabilities: Secondary | ICD-10-CM | POA: Diagnosis not present

## 2015-04-07 DIAGNOSIS — J449 Chronic obstructive pulmonary disease, unspecified: Secondary | ICD-10-CM | POA: Diagnosis not present

## 2015-04-07 DIAGNOSIS — F79 Unspecified intellectual disabilities: Secondary | ICD-10-CM | POA: Diagnosis not present

## 2015-04-07 DIAGNOSIS — R32 Unspecified urinary incontinence: Secondary | ICD-10-CM | POA: Diagnosis not present

## 2015-04-10 NOTE — Telephone Encounter (Signed)
Noted  

## 2015-04-14 ENCOUNTER — Ambulatory Visit: Payer: Self-pay | Admitting: Family Medicine

## 2015-04-15 DIAGNOSIS — R5383 Other fatigue: Secondary | ICD-10-CM | POA: Diagnosis not present

## 2015-04-15 DIAGNOSIS — J310 Chronic rhinitis: Secondary | ICD-10-CM | POA: Diagnosis not present

## 2015-04-15 DIAGNOSIS — G4733 Obstructive sleep apnea (adult) (pediatric): Secondary | ICD-10-CM | POA: Diagnosis not present

## 2015-04-15 DIAGNOSIS — J4520 Mild intermittent asthma, uncomplicated: Secondary | ICD-10-CM | POA: Diagnosis not present

## 2015-05-06 ENCOUNTER — Encounter: Payer: Self-pay | Admitting: Family Medicine

## 2015-05-09 ENCOUNTER — Ambulatory Visit: Payer: Self-pay | Admitting: Family Medicine

## 2015-05-09 ENCOUNTER — Encounter: Payer: Self-pay | Admitting: Family Medicine

## 2015-05-09 ENCOUNTER — Telehealth: Payer: Self-pay | Admitting: Family Medicine

## 2015-05-09 NOTE — Telephone Encounter (Signed)
Called and spoke with Gavin Poundeborah at [facility] she states to send pt's last office note and write may have home health nursing for medication planner.  Note and Office Visits faxed.

## 2015-05-09 NOTE — Telephone Encounter (Signed)
Edison Paceeborah Carr from [facility] called to ask for home health orders for medication planner   Phone number [phone]

## 2015-05-09 NOTE — Telephone Encounter (Signed)
Please take care of this order  

## 2015-05-12 ENCOUNTER — Other Ambulatory Visit: Payer: Self-pay | Admitting: *Deleted

## 2015-05-12 ENCOUNTER — Telehealth: Payer: Self-pay

## 2015-05-12 MED ORDER — DIAZEPAM 5 MG PO TABS: 5.0000 mg | ORAL_TABLET | Freq: Two times a day (BID) | ORAL | Status: DC | PRN

## 2015-05-12 NOTE — Telephone Encounter (Signed)
Call in #[phone] with 5 rf 

## 2015-05-12 NOTE — Telephone Encounter (Signed)
Patient is requesting a refill of Diazepam 5 mg tablet, take 1 tablet every 12 hours as needed for anxiety [facility], [place] [phone]

## 2015-05-12 NOTE — Telephone Encounter (Signed)
Done

## 2015-05-12 NOTE — Telephone Encounter (Signed)
Left mess on voicemail to call us back (for her overdue mammogram)

## 2015-05-13 ENCOUNTER — Telehealth: Payer: Self-pay | Admitting: Family Medicine

## 2015-05-13 NOTE — Telephone Encounter (Signed)
Huntley DecSara from [facility] call to say they had a referral for pt to have home health to  do her pill box to make sure she is in complaince. Home health said pt need a [employer] meter it is the one that talks .   [phone]

## 2015-05-14 NOTE — Telephone Encounter (Signed)
I contacted the sarah. She wanted to know if we had sent in a prescription for a talking meter, I advised her the prescription for the talking meter normally come from a diabetic supply company and they send the  forms so we can fill the form out. She voiced understanding and sated she would contact the pt and advise of my note. I also advised we could supply a meter from our office if need be. She stated she would let us know.

## 2015-05-14 NOTE — Telephone Encounter (Signed)
[facility] HughEllison's office.   Megan please call Huntley DecSara. They are confused about the type of meter that was ordered for the pt.

## 2015-05-22 DIAGNOSIS — F79 Unspecified intellectual disabilities: Secondary | ICD-10-CM | POA: Diagnosis not present

## 2015-05-22 DIAGNOSIS — E119 Type 2 diabetes mellitus without complications: Secondary | ICD-10-CM | POA: Diagnosis not present

## 2015-05-22 DIAGNOSIS — F331 Major depressive disorder, recurrent, moderate: Secondary | ICD-10-CM | POA: Diagnosis not present

## 2015-05-22 DIAGNOSIS — F4310 Post-traumatic stress disorder, unspecified: Secondary | ICD-10-CM | POA: Diagnosis not present

## 2015-05-28 ENCOUNTER — Other Ambulatory Visit: Payer: Self-pay | Admitting: Family Medicine

## 2015-06-27 ENCOUNTER — Other Ambulatory Visit: Payer: Self-pay | Admitting: Family Medicine

## 2015-07-09 ENCOUNTER — Ambulatory Visit (INDEPENDENT_AMBULATORY_CARE_PROVIDER_SITE_OTHER): Payer: Medicare Other | Admitting: Family Medicine

## 2015-07-09 ENCOUNTER — Encounter: Payer: Self-pay | Admitting: Family Medicine

## 2015-07-09 VITALS — BP 124/85 | HR 75 | Temp 97.8°F | Ht 64.5 in | Wt 157.0 lb

## 2015-07-09 DIAGNOSIS — I10 Essential (primary) hypertension: Secondary | ICD-10-CM | POA: Diagnosis not present

## 2015-07-09 DIAGNOSIS — F4310 Post-traumatic stress disorder, unspecified: Secondary | ICD-10-CM | POA: Diagnosis not present

## 2015-07-09 DIAGNOSIS — H6122 Impacted cerumen, left ear: Secondary | ICD-10-CM | POA: Diagnosis not present

## 2015-07-09 DIAGNOSIS — K219 Gastro-esophageal reflux disease without esophagitis: Secondary | ICD-10-CM

## 2015-07-09 DIAGNOSIS — J45909 Unspecified asthma, uncomplicated: Secondary | ICD-10-CM

## 2015-07-09 MED ORDER — ROPINIROLE HCL 1 MG PO TABS: 1.0000 mg | ORAL_TABLET | Freq: Every day | ORAL | Status: DC

## 2015-07-09 MED ORDER — ALBUTEROL SULFATE HFA 108 (90 BASE) MCG/ACT IN AERS: 2.0000 | INHALATION_SPRAY | Freq: Four times a day (QID) | RESPIRATORY_TRACT | Status: DC | PRN

## 2015-07-09 MED ORDER — MONTELUKAST SODIUM 10 MG PO TABS: 10.0000 mg | ORAL_TABLET | Freq: Every day | ORAL | Status: DC

## 2015-07-09 MED ORDER — ESOMEPRAZOLE MAGNESIUM 40 MG PO CPDR: 40.0000 mg | DELAYED_RELEASE_CAPSULE | Freq: Every day | ORAL | Status: DC

## 2015-07-09 MED ORDER — TRAZODONE HCL 100 MG PO TABS: 100.0000 mg | ORAL_TABLET | Freq: Every day | ORAL | Status: DC

## 2015-07-09 MED ORDER — DIAZEPAM 5 MG PO TABS: 5.0000 mg | ORAL_TABLET | Freq: Two times a day (BID) | ORAL | Status: DC | PRN

## 2015-07-09 MED ORDER — INSULIN GLARGINE 100 UNIT/ML SOLOSTAR PEN: 10.0000 [IU] | PEN_INJECTOR | Freq: Every day | SUBCUTANEOUS | Status: DC

## 2015-07-09 MED ORDER — ALBUTEROL SULFATE (2.5 MG/3ML) 0.083% IN NEBU
2.5000 mg | INHALATION_SOLUTION | Freq: Four times a day (QID) | RESPIRATORY_TRACT | Status: AC | PRN
Start: 2015-07-09 — End: ?

## 2015-07-09 MED ORDER — DESVENLAFAXINE SUCCINATE ER 100 MG PO TB24: 100.0000 mg | ORAL_TABLET | Freq: Every day | ORAL | Status: DC

## 2015-07-09 MED ORDER — IMIPRAMINE HCL 50 MG PO TABS: 50.0000 mg | ORAL_TABLET | Freq: Every day | ORAL | Status: DC

## 2015-07-09 MED ORDER — OXYBUTYNIN CHLORIDE ER 10 MG PO TB24: 10.0000 mg | ORAL_TABLET | Freq: Every day | ORAL | Status: DC

## 2015-07-09 MED ORDER — BUDESONIDE-FORMOTEROL FUMARATE 160-4.5 MCG/ACT IN AERO: 2.0000 | INHALATION_SPRAY | Freq: Two times a day (BID) | RESPIRATORY_TRACT | Status: DC

## 2015-07-09 NOTE — Progress Notes (Signed)
Pre visit review using our clinic review tool, if applicable. No additional management support is needed unless otherwise documented below in the visit note. 

## 2015-07-09 NOTE — Progress Notes (Signed)
   Subjective:    Patient ID: Isabel [name], female    DOB: 01-24-1956, 59 y.o.   MRN: [id number]  HPI Here to follow up on multiple issues. She is doing well in general. She does complain of loss of hearing in the left ear. She needs refill on everyting.   Review of Systems  Constitutional: Negative.   Respiratory: Negative.   Cardiovascular: Negative.   Gastrointestinal: Negative.   Neurological: Negative.   Psychiatric/Behavioral: Negative.        Objective:   Physical Exam  Constitutional: She is oriented to person, place, and time. She appears well-developed and well-nourished.  HENT:  Right Ear: External ear normal.  Nose: Nose normal.  Left ear canal is full of cerumen   Cardiovascular: Normal rate, regular rhythm, normal heart sounds and intact distal pulses.   Pulmonary/Chest: Effort normal and breath sounds normal.  Neurological: She is alert and oriented to person, place, and time.          Assessment & Plan:  She seems to be doing well in general. Her ear was irriagted clear with water. All meds were refilled.

## 2015-07-11 ENCOUNTER — Telehealth: Payer: Self-pay | Admitting: *Deleted

## 2015-07-11 MED ORDER — NEXIUM 40 MG PO CPDR: 40.0000 mg | DELAYED_RELEASE_CAPSULE | Freq: Every day | ORAL | Status: DC

## 2015-07-11 NOTE — Telephone Encounter (Signed)
Patient requests brand name Nexium and pharmacy requests a new Rx be sent for DAW.

## 2015-07-11 NOTE — Telephone Encounter (Signed)
Rx resent to pharmacy for Brand Name.   Left a message for pt that rx was resent to pharmacy.

## 2015-07-15 ENCOUNTER — Telehealth: Payer: Self-pay | Admitting: Family Medicine

## 2015-07-15 NOTE — Telephone Encounter (Signed)
Pt was unable to wait for [facility] on Friday ansd that was the last day of her certification period, [facility] will need new order for a whole new start of care.  Pls call w/ verbal or fax ok.  [phone]

## 2015-07-16 DIAGNOSIS — R5383 Other fatigue: Secondary | ICD-10-CM | POA: Diagnosis not present

## 2015-07-16 DIAGNOSIS — J4520 Mild intermittent asthma, uncomplicated: Secondary | ICD-10-CM | POA: Diagnosis not present

## 2015-07-16 DIAGNOSIS — G4733 Obstructive sleep apnea (adult) (pediatric): Secondary | ICD-10-CM | POA: Diagnosis not present

## 2015-07-16 DIAGNOSIS — J310 Chronic rhinitis: Secondary | ICD-10-CM | POA: Diagnosis not present

## 2015-07-16 NOTE — Telephone Encounter (Signed)
Pt was called the day before and cancelled at last min b/c she had to go somewhere. they

## 2015-07-16 NOTE — Telephone Encounter (Signed)
I spoke with [name] and she needStanton [name] a verbal order to continue with medication planning, pt needs help filling up pill pack . Per [name] NP okay to give verbal order for a restart and I did give this verbal order to [name]. Also will forward this note to Dr. [name] for review.

## 2015-07-20 NOTE — Progress Notes (Signed)
   Subjective:    Patient ID: Isabel [name], female    DOB: 08/23/1956, 59 y.o.   MRN: [id number]  HPI    Review of Systems     Objective:   Physical Exam        Assessment & Plan:  After we irrigated her ear canal, her hearing loss was resolved. Her PTSD is stable and her meds were refilled. Her asthma is stable.  Her GERD is stable. She will stay on the same regimen.

## 2015-07-22 NOTE — Telephone Encounter (Signed)
noted 

## 2015-08-05 DIAGNOSIS — F331 Major depressive disorder, recurrent, moderate: Secondary | ICD-10-CM | POA: Diagnosis not present

## 2015-08-08 ENCOUNTER — Telehealth: Payer: Self-pay | Admitting: Family Medicine

## 2015-08-08 NOTE — Telephone Encounter (Signed)
Pt would like a cb to discuss name brand NEXIUM 40 MG capsule Pt has not heard any new info about getting the approved.

## 2015-08-12 ENCOUNTER — Other Ambulatory Visit: Payer: Self-pay | Admitting: *Deleted

## 2015-08-12 ENCOUNTER — Ambulatory Visit (INDEPENDENT_AMBULATORY_CARE_PROVIDER_SITE_OTHER): Payer: Medicare Other | Admitting: Endocrinology

## 2015-08-12 ENCOUNTER — Encounter: Payer: Self-pay | Admitting: Endocrinology

## 2015-08-12 VITALS — BP 124/70 | HR 77 | Temp 97.9°F | Ht 64.5 in | Wt 147.0 lb

## 2015-08-12 DIAGNOSIS — E1042 Type 1 diabetes mellitus with diabetic polyneuropathy: Secondary | ICD-10-CM

## 2015-08-12 LAB — POCT GLYCOSYLATED HEMOGLOBIN (HGB A1C): Hemoglobin A1C: 13.7

## 2015-08-12 MED ORDER — INSULIN ASPART 100 UNIT/ML FLEXPEN: 7.0000 [IU] | PEN_INJECTOR | Freq: Every day | SUBCUTANEOUS | Status: DC

## 2015-08-12 MED ORDER — GLUCOSE BLOOD VI STRP: 1.0000 | ORAL_STRIP | Freq: Two times a day (BID) | Status: DC

## 2015-08-12 MED ORDER — INSULIN GLARGINE 100 UNIT/ML SOLOSTAR PEN: 15.0000 [IU] | PEN_INJECTOR | SUBCUTANEOUS | Status: DC

## 2015-08-12 MED ORDER — GLUCOSE BLOOD VI STRP
1.0000 | ORAL_STRIP | Freq: Two times a day (BID) | Status: DC
Start: 2015-08-12 — End: 2015-08-12

## 2015-08-12 NOTE — Progress Notes (Signed)
Subjective:    Patient ID: Isabel [name], female    DOB: 07/26/1956, 59 y.o.   MRN: [id number]  HPI Pt returns for f/u of diabetes mellitus: DM type: 1 Dx'ed: 2011, when she presented with gallstone pancreatitis.  Complications: polyneuropathy. Therapy: insulin since dx.   GDM: never. DKA: once (2014) Severe hypoglycemia: never.   Pancreatitis: only once, at dx.  Other: she takes multiple daily injections.  She declines pump and continuous glucose monitor Interval history:  Pt says she takes novolog 7 units, just once per day (with the evening meal).  no cbg record, but states cbg's are well-controlled.  She does not check cbg's.   Past Medical History  Diagnosis Date  . Allergy   . Asthma   . Depression   . GERD (gastroesophageal reflux disease)   . Hypertension   . Cystitis, interstitial   . Pneumonia   . Diabetes mellitus without complication   . Pancreatic pseudocyst   . Sleep apnea     wears a CPAP at night     Past Surgical History  Procedure Laterality Date  . Cystgastrostomy  09-02-12    per Dr. Cherly Hensen at [facility]   . Cholecystectomy      Social History   Social History  . Marital Status: Single    Spouse Name: N/A  . Number of Children: N/A  . Years of Education: N/A   Occupational History  . Not on file.   Social History Main Topics  . Smoking status: Never Smoker   . Smokeless tobacco: Never Used  . Alcohol Use: No  . Drug Use: No  . Sexual Activity: No   Other Topics Concern  . Not on file   Social History Narrative    Current Outpatient Prescriptions on File Prior to Visit  Medication Sig Dispense Refill  . acetaminophen (TYLENOL) 325 MG tablet Take 2 tablets (650 mg total) by mouth every 6 (six) hours as needed for headache.    . albuterol (PROVENTIL HFA;VENTOLIN HFA) 108 (90 BASE) MCG/ACT inhaler Inhale 2 puffs into the lungs every 6 (six) hours as needed for wheezing or shortness of breath. 1 Inhaler 11  . albuterol (PROVENTIL) (2.5  MG/3ML) 0.083% nebulizer solution Take 3 mLs (2.5 mg total) by nebulization every 6 (six) hours as needed for wheezing or shortness of breath. 75 mL 11  . budesonide-formoterol (SYMBICORT) 160-4.5 MCG/ACT inhaler Inhale 2 puffs into the lungs 2 (two) times daily. For asthma 1 Inhaler 11  . cetirizine (ZYRTEC) 10 MG tablet Take 1 tablet (10 mg total) by mouth daily. For allergies 90 tablet 3  . desvenlafaxine (PRISTIQ) 100 MG 24 hr tablet Take 1 tablet (100 mg total) by mouth daily. 30 tablet 11  . diazepam (VALIUM) 5 MG tablet Take 1 tablet (5 mg total) by mouth every 12 (twelve) hours as needed for anxiety. 60 tablet 5  . imipramine (TOFRANIL) 50 MG tablet Take 1 tablet (50 mg total) by mouth at bedtime. 30 tablet 11  . mometasone (NASONEX) 50 MCG/ACT nasal spray INSTILL 2 SPRAYS IN EACH NOSTRIL DAILY 51 g 1  . montelukast (SINGULAIR) 10 MG tablet Take 1 tablet (10 mg total) by mouth at bedtime. For asthma 30 tablet 11  . NEXIUM 40 MG capsule Take 1 capsule (40 mg total) by mouth daily at 12 noon. 30 capsule 11  . nystatin cream (MYCOSTATIN) Apply 1 application topically 2 (two) times daily as needed for dry skin (rash). 30 g 0  .  oxybutynin (DITROPAN-XL) 10 MG 24 hr tablet Take 1 tablet (10 mg total) by mouth daily. For bladder spasm 30 tablet 11  . RELION SHORT PEN NEEDLES 31G X 8 MM MISC USE AS DIRECTED 100 each 0  . rOPINIRole (REQUIP) 1 MG tablet Take 1 tablet (1 mg total) by mouth at bedtime. For restless leg syndrome 30 tablet 11  . traZODone (DESYREL) 100 MG tablet Take 1 tablet (100 mg total) by mouth at bedtime. 30 tablet 11   No current facility-administered medications on file prior to visit.    Allergies  Allergen Reactions  . Aspirin     REACTION: unspecified  . Latex     rash  . Penicillins     REACTION: unspecified  . Povidone-Iodine     REACTION: unspecified  . Sulfamethoxazole     REACTION: unspecified    Family History  Problem Relation Age of Onset  . Cancer        other    BP 124/70 mmHg  Pulse 77  Temp(Src) 97.9 F (36.6 C) (Oral)  Ht 5' 4.5" (1.638 m)  Wt 147 lb (66.679 kg)  BMI 24.85 kg/m2  SpO2 95%  Review of Systems She denies hypoglycemia    Objective:   Physical Exam VITAL SIGNS:  See vs page GENERAL: no distress Pulses: dorsalis pedis intact bilat.   MSK: no deformity of the feet CV: no leg edema.   Skin:  no ulcer on the feet.  normal color and temp on the feet. Neuro: sensation is intact to touch on the feet.  Ext: toenails are very long.    A1c=13.7%    Assessment & Plan:  Noncompliance with cbg recording and insulin.   DM: very poor control. Long toenails: this puts pt at risk for paronychial infection.    Patient is advised the following: Patient Instructions  check your blood sugar twice a day.  vary the time of day when you check, between before the 3 meals, and at bedtime.  also check if you have symptoms of your blood sugar being too high or too low.  please keep a record of the readings and bring it to your next appointment here.  You can write it on any piece of paper.  please call us sooner if your blood sugar goes below 70, or if you have a lot of readings over 200.   Here is a new meter.  i have sent a prescription to your pharmacy, for strips.  Please call if the insurance does not pay, and tell us what type they do pay for. Please increase the lantus to 15 units each morning. Please continue the same novolog (7 units with the evening meal).  Please come back for a follow-up appointment in 3 months.   It is important to keep your toenails trimmed, to prevent injury.

## 2015-08-12 NOTE — Patient Instructions (Addendum)
check your blood sugar twice a day.  vary the time of day when you check, between before the 3 meals, and at bedtime.  also check if you have symptoms of your blood sugar being too high or too low.  please keep a record of the readings and bring it to your next appointment here.  You can write it on any piece of paper.  please call us sooner if your blood sugar goes below 70, or if you have a lot of readings over 200.   Here is a new meter.  i have sent a prescription to your pharmacy, for strips.  Please call if the insurance does not pay, and tell us what type they do pay for. Please increase the lantus to 15 units each morning. Please continue the same novolog (7 units with the evening meal).  Please come back for a follow-up appointment in 3 months.   It is important to keep your toenails trimmed, to prevent injury.

## 2015-08-14 ENCOUNTER — Telehealth: Payer: Self-pay | Admitting: Family Medicine

## 2015-08-14 NOTE — Telephone Encounter (Signed)
Please increase the lantus to 20 units each morning. Please call next week to report cbg's.

## 2015-08-14 NOTE — Telephone Encounter (Signed)
Called and spoke to patient and Argyle from [facility]. Both are updated and aware of MD Ellison's instructions.

## 2015-08-14 NOTE — Telephone Encounter (Signed)
Called [facility] and spoke with [facility] to let Aundra Millet know about pt's message. Dr. Everardo All is out of the office as well as Dr. [name].

## 2015-08-14 NOTE — Telephone Encounter (Signed)
Holly from [facility] called to report pt BS of 505.  Elease Hashimoto will triage the call.

## 2015-08-14 NOTE — Telephone Encounter (Signed)
Attempted to reach the pt at 430. Pt was unavailable. Pt was advised of Dr. George Hugh instructions via Rolly Pancake.

## 2015-08-14 NOTE — Telephone Encounter (Signed)
Spoke with [place] from [facility] and she states that pt told her the blood sugars had been up in the 600's and she saw the endocrinologist on Tuesday and some of her insulin was changed.  Advised that this message would be forwarded to Dr. Everardo All for review as well.

## 2015-10-14 DIAGNOSIS — J310 Chronic rhinitis: Secondary | ICD-10-CM | POA: Diagnosis not present

## 2015-10-14 DIAGNOSIS — G4733 Obstructive sleep apnea (adult) (pediatric): Secondary | ICD-10-CM | POA: Diagnosis not present

## 2015-10-14 DIAGNOSIS — R5383 Other fatigue: Secondary | ICD-10-CM | POA: Diagnosis not present

## 2015-10-14 DIAGNOSIS — J4520 Mild intermittent asthma, uncomplicated: Secondary | ICD-10-CM | POA: Diagnosis not present

## 2015-10-24 DIAGNOSIS — G92 Toxic encephalopathy: Secondary | ICD-10-CM | POA: Diagnosis not present

## 2015-10-24 DIAGNOSIS — I10 Essential (primary) hypertension: Secondary | ICD-10-CM | POA: Diagnosis present

## 2015-10-24 DIAGNOSIS — E1010 Type 1 diabetes mellitus with ketoacidosis without coma: Secondary | ICD-10-CM | POA: Diagnosis not present

## 2015-10-24 DIAGNOSIS — D72829 Elevated white blood cell count, unspecified: Secondary | ICD-10-CM | POA: Diagnosis not present

## 2015-10-24 DIAGNOSIS — R7309 Other abnormal glucose: Secondary | ICD-10-CM | POA: Diagnosis not present

## 2015-10-24 DIAGNOSIS — E1310 Other specified diabetes mellitus with ketoacidosis without coma: Secondary | ICD-10-CM | POA: Diagnosis not present

## 2015-10-24 DIAGNOSIS — E860 Dehydration: Secondary | ICD-10-CM | POA: Diagnosis not present

## 2015-10-24 DIAGNOSIS — R739 Hyperglycemia, unspecified: Secondary | ICD-10-CM | POA: Diagnosis not present

## 2015-10-24 DIAGNOSIS — G2581 Restless legs syndrome: Secondary | ICD-10-CM | POA: Diagnosis present

## 2015-10-24 DIAGNOSIS — Z794 Long term (current) use of insulin: Secondary | ICD-10-CM | POA: Diagnosis not present

## 2015-10-24 DIAGNOSIS — K219 Gastro-esophageal reflux disease without esophagitis: Secondary | ICD-10-CM | POA: Diagnosis not present

## 2015-10-24 DIAGNOSIS — G9341 Metabolic encephalopathy: Secondary | ICD-10-CM | POA: Diagnosis not present

## 2015-10-24 DIAGNOSIS — J4520 Mild intermittent asthma, uncomplicated: Secondary | ICD-10-CM | POA: Diagnosis present

## 2015-10-24 DIAGNOSIS — Z87891 Personal history of nicotine dependence: Secondary | ICD-10-CM | POA: Diagnosis not present

## 2015-10-24 DIAGNOSIS — R0600 Dyspnea, unspecified: Secondary | ICD-10-CM | POA: Diagnosis not present

## 2015-10-24 DIAGNOSIS — E876 Hypokalemia: Secondary | ICD-10-CM | POA: Diagnosis present

## 2015-10-24 DIAGNOSIS — F321 Major depressive disorder, single episode, moderate: Secondary | ICD-10-CM | POA: Diagnosis present

## 2015-10-24 DIAGNOSIS — Z79899 Other long term (current) drug therapy: Secondary | ICD-10-CM | POA: Diagnosis not present

## 2015-10-24 DIAGNOSIS — Z452 Encounter for adjustment and management of vascular access device: Secondary | ICD-10-CM | POA: Diagnosis not present

## 2015-10-24 DIAGNOSIS — F0390 Unspecified dementia without behavioral disturbance: Secondary | ICD-10-CM | POA: Diagnosis present

## 2015-10-25 DIAGNOSIS — E860 Dehydration: Secondary | ICD-10-CM | POA: Diagnosis not present

## 2015-10-25 DIAGNOSIS — K219 Gastro-esophageal reflux disease without esophagitis: Secondary | ICD-10-CM | POA: Diagnosis not present

## 2015-10-25 DIAGNOSIS — G92 Toxic encephalopathy: Secondary | ICD-10-CM | POA: Diagnosis not present

## 2015-10-25 DIAGNOSIS — E1310 Other specified diabetes mellitus with ketoacidosis without coma: Secondary | ICD-10-CM | POA: Diagnosis not present

## 2015-10-26 DIAGNOSIS — G92 Toxic encephalopathy: Secondary | ICD-10-CM | POA: Diagnosis not present

## 2015-10-26 DIAGNOSIS — E1310 Other specified diabetes mellitus with ketoacidosis without coma: Secondary | ICD-10-CM | POA: Diagnosis not present

## 2015-10-26 DIAGNOSIS — K219 Gastro-esophageal reflux disease without esophagitis: Secondary | ICD-10-CM | POA: Diagnosis not present

## 2015-10-26 DIAGNOSIS — E860 Dehydration: Secondary | ICD-10-CM | POA: Diagnosis not present

## 2015-10-27 DIAGNOSIS — E1310 Other specified diabetes mellitus with ketoacidosis without coma: Secondary | ICD-10-CM | POA: Diagnosis not present

## 2015-10-27 DIAGNOSIS — K219 Gastro-esophageal reflux disease without esophagitis: Secondary | ICD-10-CM | POA: Diagnosis not present

## 2015-10-27 DIAGNOSIS — G92 Toxic encephalopathy: Secondary | ICD-10-CM | POA: Diagnosis not present

## 2015-10-27 DIAGNOSIS — E860 Dehydration: Secondary | ICD-10-CM | POA: Diagnosis not present

## 2015-10-28 ENCOUNTER — Telehealth: Payer: Self-pay | Admitting: Family Medicine

## 2015-10-28 NOTE — Telephone Encounter (Signed)
Ok to schedule.

## 2015-10-28 NOTE — Telephone Encounter (Signed)
This is pt's responsibility, if she wants us to take care then she will have to come in and sign and release of information so that it can be faxed to the hospital.

## 2015-10-28 NOTE — Telephone Encounter (Addendum)
Just wanted to let you know that I called [facility] and they informed me that a signed medical release form is not required if a medical facility requests records after a patient's hospitalization.  They just need the request on our letterhead.  So I have sent for the records and the request is in the patient's chart under documents - Release of Information.  Hopefully the records will be here before the patient's appt.

## 2015-10-28 NOTE — Telephone Encounter (Signed)
Oliver Pilarish - Cousin of the patient would like to know if she can bring the patient to Dr. Clent RidgesFry for her hospital follow up early afternoon, on the 5th of December.  This would be after she sees Dr. Everardo AllEllison at 10:00am. She says go ahead and make the appt if the doctor agrees to it.

## 2015-10-28 NOTE — Telephone Encounter (Signed)
Patient's cousin - Rosann Auerbachrish has been informed that an appt was made on 11/10/15 at 1:00pm.  The cousin would also like for the RN to send for ALL of the patient's records from [facility] in [place] before the hospital follow up appt.

## 2015-10-29 ENCOUNTER — Ambulatory Visit: Payer: Self-pay | Admitting: Family Medicine

## 2015-10-31 DIAGNOSIS — F419 Anxiety disorder, unspecified: Secondary | ICD-10-CM | POA: Diagnosis not present

## 2015-10-31 DIAGNOSIS — K219 Gastro-esophageal reflux disease without esophagitis: Secondary | ICD-10-CM | POA: Diagnosis not present

## 2015-10-31 DIAGNOSIS — Z794 Long term (current) use of insulin: Secondary | ICD-10-CM | POA: Diagnosis not present

## 2015-10-31 DIAGNOSIS — F331 Major depressive disorder, recurrent, moderate: Secondary | ICD-10-CM | POA: Diagnosis not present

## 2015-10-31 DIAGNOSIS — I10 Essential (primary) hypertension: Secondary | ICD-10-CM | POA: Diagnosis not present

## 2015-10-31 DIAGNOSIS — R269 Unspecified abnormalities of gait and mobility: Secondary | ICD-10-CM | POA: Diagnosis not present

## 2015-10-31 DIAGNOSIS — J449 Chronic obstructive pulmonary disease, unspecified: Secondary | ICD-10-CM | POA: Diagnosis not present

## 2015-10-31 DIAGNOSIS — M6281 Muscle weakness (generalized): Secondary | ICD-10-CM | POA: Diagnosis not present

## 2015-10-31 DIAGNOSIS — Z915 Personal history of self-harm: Secondary | ICD-10-CM | POA: Diagnosis not present

## 2015-10-31 DIAGNOSIS — E119 Type 2 diabetes mellitus without complications: Secondary | ICD-10-CM | POA: Diagnosis not present

## 2015-10-31 DIAGNOSIS — Z87891 Personal history of nicotine dependence: Secondary | ICD-10-CM | POA: Diagnosis not present

## 2015-10-31 DIAGNOSIS — F4310 Post-traumatic stress disorder, unspecified: Secondary | ICD-10-CM | POA: Diagnosis not present

## 2015-11-03 DIAGNOSIS — M6281 Muscle weakness (generalized): Secondary | ICD-10-CM | POA: Diagnosis not present

## 2015-11-03 DIAGNOSIS — F419 Anxiety disorder, unspecified: Secondary | ICD-10-CM | POA: Diagnosis not present

## 2015-11-03 DIAGNOSIS — E119 Type 2 diabetes mellitus without complications: Secondary | ICD-10-CM | POA: Diagnosis not present

## 2015-11-03 DIAGNOSIS — F331 Major depressive disorder, recurrent, moderate: Secondary | ICD-10-CM | POA: Diagnosis not present

## 2015-11-03 DIAGNOSIS — R269 Unspecified abnormalities of gait and mobility: Secondary | ICD-10-CM | POA: Diagnosis not present

## 2015-11-03 DIAGNOSIS — I10 Essential (primary) hypertension: Secondary | ICD-10-CM | POA: Diagnosis not present

## 2015-11-05 DIAGNOSIS — M6281 Muscle weakness (generalized): Secondary | ICD-10-CM | POA: Diagnosis not present

## 2015-11-05 DIAGNOSIS — I10 Essential (primary) hypertension: Secondary | ICD-10-CM | POA: Diagnosis not present

## 2015-11-05 DIAGNOSIS — R269 Unspecified abnormalities of gait and mobility: Secondary | ICD-10-CM | POA: Diagnosis not present

## 2015-11-05 DIAGNOSIS — F331 Major depressive disorder, recurrent, moderate: Secondary | ICD-10-CM | POA: Diagnosis not present

## 2015-11-05 DIAGNOSIS — E119 Type 2 diabetes mellitus without complications: Secondary | ICD-10-CM | POA: Diagnosis not present

## 2015-11-05 DIAGNOSIS — F419 Anxiety disorder, unspecified: Secondary | ICD-10-CM | POA: Diagnosis not present

## 2015-11-06 ENCOUNTER — Telehealth: Payer: Self-pay | Admitting: Family Medicine

## 2015-11-06 DIAGNOSIS — Z7689 Persons encountering health services in other specified circumstances: Secondary | ICD-10-CM

## 2015-11-06 NOTE — Telephone Encounter (Signed)
Mark a PT with [facility] home [facility] call to request  PT for 1 times a week for 1 week 2 times a week for 3 weeks and 1 time a week for 2 weeks    [phone]

## 2015-11-07 DIAGNOSIS — M6281 Muscle weakness (generalized): Secondary | ICD-10-CM | POA: Diagnosis not present

## 2015-11-07 DIAGNOSIS — E119 Type 2 diabetes mellitus without complications: Secondary | ICD-10-CM | POA: Diagnosis not present

## 2015-11-07 DIAGNOSIS — F331 Major depressive disorder, recurrent, moderate: Secondary | ICD-10-CM | POA: Diagnosis not present

## 2015-11-07 DIAGNOSIS — R269 Unspecified abnormalities of gait and mobility: Secondary | ICD-10-CM | POA: Diagnosis not present

## 2015-11-07 DIAGNOSIS — F419 Anxiety disorder, unspecified: Secondary | ICD-10-CM | POA: Diagnosis not present

## 2015-11-07 DIAGNOSIS — I10 Essential (primary) hypertension: Secondary | ICD-10-CM | POA: Diagnosis not present

## 2015-11-07 NOTE — Telephone Encounter (Signed)
Okay per Dr. Clent RidgesFry to give a verbal order and I did leave this message on Mark's voice mail.

## 2015-11-10 ENCOUNTER — Encounter: Payer: Self-pay | Admitting: Family Medicine

## 2015-11-10 ENCOUNTER — Encounter: Payer: Self-pay | Admitting: Endocrinology

## 2015-11-10 ENCOUNTER — Ambulatory Visit (INDEPENDENT_AMBULATORY_CARE_PROVIDER_SITE_OTHER): Payer: Medicare Other | Admitting: Family Medicine

## 2015-11-10 ENCOUNTER — Telehealth: Payer: Self-pay | Admitting: Endocrinology

## 2015-11-10 ENCOUNTER — Ambulatory Visit (INDEPENDENT_AMBULATORY_CARE_PROVIDER_SITE_OTHER): Payer: Medicare Other | Admitting: Endocrinology

## 2015-11-10 VITALS — BP 134/88 | HR 80 | Temp 98.1°F | Ht 64.5 in | Wt 148.0 lb

## 2015-11-10 VITALS — BP 130/89 | HR 70 | Temp 98.1°F | Ht 64.5 in | Wt 149.0 lb

## 2015-11-10 DIAGNOSIS — F4310 Post-traumatic stress disorder, unspecified: Secondary | ICD-10-CM | POA: Diagnosis not present

## 2015-11-10 DIAGNOSIS — I10 Essential (primary) hypertension: Secondary | ICD-10-CM

## 2015-11-10 DIAGNOSIS — F329 Major depressive disorder, single episode, unspecified: Secondary | ICD-10-CM

## 2015-11-10 DIAGNOSIS — F32A Depression, unspecified: Secondary | ICD-10-CM

## 2015-11-10 DIAGNOSIS — E1042 Type 1 diabetes mellitus with diabetic polyneuropathy: Secondary | ICD-10-CM

## 2015-11-10 MED ORDER — GLUCOSE BLOOD VI STRP: 1.0000 | ORAL_STRIP | Freq: Four times a day (QID) | Status: DC

## 2015-11-10 MED ORDER — ASPIRIN EC 81 MG PO TBEC: 81.0000 mg | DELAYED_RELEASE_TABLET | Freq: Every day | ORAL | Status: DC

## 2015-11-10 MED ORDER — INSULIN ASPART PROT & ASPART (70-30 MIX) 100 UNIT/ML PEN: PEN_INJECTOR | SUBCUTANEOUS | Status: DC

## 2015-11-10 NOTE — Progress Notes (Signed)
Pre visit review using our clinic review tool, if applicable. No additional management support is needed unless otherwise documented below in the visit note. 

## 2015-11-10 NOTE — Patient Instructions (Addendum)
check your blood sugar 4 times a day: before the 3 meals, and at bedtime.  also check if you have symptoms of your blood sugar being too high or too low.  please keep a record of the readings and bring it to your next appointment here.  You can write it on any piece of paper.  please call us sooner if your blood sugar goes below 70, or if you have a lot of readings over 200.   Please change both insulins to novolog 70/30, 25 units with breakfast, and 15 units with the evening meal.   On this type of insulin schedule, you should eat meals on a regular schedule.  If a meal is missed or significantly delayed, your blood sugar could go low.  Please call in 3-4 days, to tell us how the blood sugar is doing.   Please come back for a follow-up appointment in 6 weeks.    Please see Bonita QuinLinda (diabetes education) the same day.

## 2015-11-10 NOTE — Progress Notes (Signed)
Subjective:    Patient ID: Isabel [name], female    DOB: Jun 09, 1956, 59 y.o.   MRN: [id number]  HPI Pt returns for f/u of diabetes mellitus: DM type: 1 Dx'ed: 2011, when she presented with gallstone pancreatitis.   Complications: polyneuropathy.  Therapy: insulin since dx.   GDM: never. DKA: once (2014) Severe hypoglycemia: never.   Pancreatitis: only once, at dx.  Other: she takes multiple daily injections.  She declines pump and continuous glucose monitor Interval history:  Pt was recently hospitalized at [facility], for DKA.   Pt says she had stopped her insulin the day before, due to n/v.  Since hosp d/c, pt says she has been checking cbg's, but has no cbg record.  She takes multiple daily injections, for a total of 39 units per day.  She says cbg's vary from 129-430.  It is in general higher as the day goes on.  I asked dtr, who says prior to the illness, cbg's were well-controlled, but she wants to go to just bid insulin.   Past Medical History  Diagnosis Date  . Allergy   . Asthma   . Depression   . GERD (gastroesophageal reflux disease)   . Hypertension   . Cystitis, interstitial   . Pneumonia   . Diabetes mellitus without complication (HCC)   . Pancreatic pseudocyst   . Sleep apnea     wears a CPAP at night     Past Surgical History  Procedure Laterality Date  . Cystgastrostomy  09-02-12    per Dr. Cherly Hensen at [facility]   . Cholecystectomy      Social History   Social History  . Marital Status: Single    Spouse Name: N/A  . Number of Children: N/A  . Years of Education: N/A   Occupational History  . Not on file.   Social History Main Topics  . Smoking status: Never Smoker   . Smokeless tobacco: Never Used  . Alcohol Use: No  . Drug Use: No  . Sexual Activity: No   Other Topics Concern  . Not on file   Social History Narrative    Current Outpatient Prescriptions on File Prior to Visit  Medication Sig Dispense Refill  . acetaminophen  (TYLENOL) 325 MG tablet Take 2 tablets (650 mg total) by mouth every 6 (six) hours as needed for headache.    . albuterol (PROVENTIL HFA;VENTOLIN HFA) 108 (90 BASE) MCG/ACT inhaler Inhale 2 puffs into the lungs every 6 (six) hours as needed for wheezing or shortness of breath. 1 Inhaler 11  . albuterol (PROVENTIL) (2.5 MG/3ML) 0.083% nebulizer solution Take 3 mLs (2.5 mg total) by nebulization every 6 (six) hours as needed for wheezing or shortness of breath. 75 mL 11  . budesonide-formoterol (SYMBICORT) 160-4.5 MCG/ACT inhaler Inhale 2 puffs into the lungs 2 (two) times daily. For asthma 1 Inhaler 11  . cetirizine (ZYRTEC) 10 MG tablet Take 1 tablet (10 mg total) by mouth daily. For allergies 90 tablet 3  . desvenlafaxine (PRISTIQ) 100 MG 24 hr tablet Take 1 tablet (100 mg total) by mouth daily. 30 tablet 11  . diazepam (VALIUM) 5 MG tablet Take 1 tablet (5 mg total) by mouth every 12 (twelve) hours as needed for anxiety. 60 tablet 5  . imipramine (TOFRANIL) 50 MG tablet Take 1 tablet (50 mg total) by mouth at bedtime. 30 tablet 11  . mometasone (NASONEX) 50 MCG/ACT nasal spray INSTILL 2 SPRAYS IN EACH NOSTRIL DAILY 51 g 1  .  montelukast (SINGULAIR) 10 MG tablet Take 1 tablet (10 mg total) by mouth at bedtime. For asthma 30 tablet 11  . NEXIUM 40 MG capsule Take 1 capsule (40 mg total) by mouth daily at 12 noon. 30 capsule 11  . nystatin cream (MYCOSTATIN) Apply 1 application topically 2 (two) times daily as needed for dry skin (rash). 30 g 0  . oxybutynin (DITROPAN-XL) 10 MG 24 hr tablet Take 1 tablet (10 mg total) by mouth daily. For bladder spasm 30 tablet 11  . RELION SHORT PEN NEEDLES 31G X 8 MM MISC USE AS DIRECTED 100 each 0  . rOPINIRole (REQUIP) 1 MG tablet Take 1 tablet (1 mg total) by mouth at bedtime. For restless leg syndrome 30 tablet 11  . traZODone (DESYREL) 100 MG tablet Take 1 tablet (100 mg total) by mouth at bedtime. 30 tablet 11   No current facility-administered medications on  file prior to visit.    Allergies  Allergen Reactions  . Aspirin     REACTION: unspecified  . Latex     rash  . Metformin And Related   . Penicillins     REACTION: unspecified  . Povidone-Iodine     REACTION: unspecified  . Sulfamethoxazole     REACTION: unspecified    Family History  Problem Relation Age of Onset  . Cancer      other    BP 134/88 mmHg  Pulse 80  Temp(Src) 98.1 F (36.7 C) (Oral)  Ht 5' 4.5" (1.638 m)  Wt 148 lb (67.132 kg)  BMI 25.02 kg/m2  SpO2 97%  Review of Systems No weight change.      Objective:   Physical Exam VITAL SIGNS:  See vs page. GENERAL: no distress. Pulses: dorsalis pedis intact bilat.   MSK: no deformity of the feet.   CV: no leg edema.   Skin:  no ulcer on the feet.  normal color and temp on the feet.   Neuro: sensation is intact to touch on the feet.   Ext: There is bilateral onychomycosis of the toenails, and long toenails.    outside test results are reviewed:  A1c=12.5%  I have reviewed outside records, and summarized: Pt was recently hospitalized with DKA     Assessment & Plan:  DM: in view of DKA, dtr says she needs a simpler regimen, and I agree.  She also needs to see CDE about sick day mgmt.   Patient is advised the following: Patient Instructions  check your blood sugar 4 times a day: before the 3 meals, and at bedtime.  also check if you have symptoms of your blood sugar being too high or too low.  please keep a record of the readings and bring it to your next appointment here.  You can write it on any piece of paper.  please call us sooner if your blood sugar goes below 70, or if you have a lot of readings over 200.   Please change both insulins to novolog 70/30, 25 units with breakfast, and 15 units with the evening meal.   On this type of insulin schedule, you should eat meals on a regular schedule.  If a meal is missed or significantly delayed, your blood sugar could go low.  Please call in 3-4 days, to  tell us how the blood sugar is doing.   Please come back for a follow-up appointment in 6 weeks.    Please see Bonita QuinLinda (diabetes education) the same day.

## 2015-11-10 NOTE — Progress Notes (Signed)
   Subjective:    Patient ID: Isabel [name] Isabel [name], female    DOB: 04-20-1956, 59 y.o.   MRN: [id number]  HPI Here to follow up a stay at [facility] from  10-24-15 to 10-27-15 for DKA. Her glucose on arrival was 613 and she was encephalopathic. After her fluids and electrolytes were balanced she did well. She was sent home on Lantus, but when she saw Dr. Everardo AllEllison on 11-06-15 he switched this to Novolog mix 70/30 to take 25 units in the am and 15 units in the pm. She has been doing better with reasonable glucose readings. She has been getting PT also to increase strength. She currently walking with a walker. Her BP is stable.     Review of Systems  Respiratory: Negative.   Cardiovascular: Negative.   Endocrine: Negative.   Neurological: Positive for weakness. Negative for dizziness, tremors, seizures, syncope, facial asymmetry, speech difficulty, light-headedness, numbness and headaches.       Objective:   Physical Exam  Constitutional: She is oriented to person, place, and time. She appears well-developed and well-nourished.  Neck: No thyromegaly present.  Cardiovascular: Normal rate, regular rhythm, normal heart sounds and intact distal pulses.   Pulmonary/Chest: Effort normal and breath sounds normal.  Musculoskeletal: She exhibits no edema.  Lymphadenopathy:    She has no cervical adenopathy.  Neurological: She is alert and oriented to person, place, and time.          Assessment & Plan:  She is doing well after an episode of DKA. She will follow up with Dr. Everardo AllEllison for diabetes. She will continue with PT. Recheck here in 3 months

## 2015-11-10 NOTE — Telephone Encounter (Signed)
Rinaldo Cloudamela from [facility]  has question about patient referral please advise  [phone]

## 2015-11-10 NOTE — Telephone Encounter (Signed)
Attempted to reach ConejoPamela. Will try again at a later time.

## 2015-11-11 DIAGNOSIS — E119 Type 2 diabetes mellitus without complications: Secondary | ICD-10-CM | POA: Diagnosis not present

## 2015-11-11 DIAGNOSIS — I10 Essential (primary) hypertension: Secondary | ICD-10-CM | POA: Diagnosis not present

## 2015-11-11 DIAGNOSIS — F419 Anxiety disorder, unspecified: Secondary | ICD-10-CM | POA: Diagnosis not present

## 2015-11-11 DIAGNOSIS — M6281 Muscle weakness (generalized): Secondary | ICD-10-CM | POA: Diagnosis not present

## 2015-11-11 DIAGNOSIS — F331 Major depressive disorder, recurrent, moderate: Secondary | ICD-10-CM | POA: Diagnosis not present

## 2015-11-11 DIAGNOSIS — R269 Unspecified abnormalities of gait and mobility: Secondary | ICD-10-CM | POA: Diagnosis not present

## 2015-11-11 NOTE — Telephone Encounter (Signed)
The referral placed is for a office visit with Bonita QuinLinda. Patient as been scheduled for this appointment.

## 2015-11-13 DIAGNOSIS — F419 Anxiety disorder, unspecified: Secondary | ICD-10-CM | POA: Diagnosis not present

## 2015-11-13 DIAGNOSIS — F331 Major depressive disorder, recurrent, moderate: Secondary | ICD-10-CM | POA: Diagnosis not present

## 2015-11-13 DIAGNOSIS — M6281 Muscle weakness (generalized): Secondary | ICD-10-CM | POA: Diagnosis not present

## 2015-11-13 DIAGNOSIS — R269 Unspecified abnormalities of gait and mobility: Secondary | ICD-10-CM | POA: Diagnosis not present

## 2015-11-13 DIAGNOSIS — E119 Type 2 diabetes mellitus without complications: Secondary | ICD-10-CM | POA: Diagnosis not present

## 2015-11-13 DIAGNOSIS — I10 Essential (primary) hypertension: Secondary | ICD-10-CM | POA: Diagnosis not present

## 2015-11-15 DIAGNOSIS — E119 Type 2 diabetes mellitus without complications: Secondary | ICD-10-CM | POA: Diagnosis not present

## 2015-11-15 DIAGNOSIS — I10 Essential (primary) hypertension: Secondary | ICD-10-CM | POA: Diagnosis not present

## 2015-11-15 DIAGNOSIS — R269 Unspecified abnormalities of gait and mobility: Secondary | ICD-10-CM | POA: Diagnosis not present

## 2015-11-15 DIAGNOSIS — F419 Anxiety disorder, unspecified: Secondary | ICD-10-CM | POA: Diagnosis not present

## 2015-11-15 DIAGNOSIS — F331 Major depressive disorder, recurrent, moderate: Secondary | ICD-10-CM | POA: Diagnosis not present

## 2015-11-15 DIAGNOSIS — M6281 Muscle weakness (generalized): Secondary | ICD-10-CM | POA: Diagnosis not present

## 2015-11-17 DIAGNOSIS — F331 Major depressive disorder, recurrent, moderate: Secondary | ICD-10-CM | POA: Diagnosis not present

## 2015-11-17 DIAGNOSIS — M6281 Muscle weakness (generalized): Secondary | ICD-10-CM | POA: Diagnosis not present

## 2015-11-17 DIAGNOSIS — I10 Essential (primary) hypertension: Secondary | ICD-10-CM | POA: Diagnosis not present

## 2015-11-17 DIAGNOSIS — E119 Type 2 diabetes mellitus without complications: Secondary | ICD-10-CM | POA: Diagnosis not present

## 2015-11-17 DIAGNOSIS — F419 Anxiety disorder, unspecified: Secondary | ICD-10-CM | POA: Diagnosis not present

## 2015-11-17 DIAGNOSIS — R269 Unspecified abnormalities of gait and mobility: Secondary | ICD-10-CM | POA: Diagnosis not present

## 2015-11-19 DIAGNOSIS — F419 Anxiety disorder, unspecified: Secondary | ICD-10-CM | POA: Diagnosis not present

## 2015-11-19 DIAGNOSIS — F331 Major depressive disorder, recurrent, moderate: Secondary | ICD-10-CM | POA: Diagnosis not present

## 2015-11-19 DIAGNOSIS — R269 Unspecified abnormalities of gait and mobility: Secondary | ICD-10-CM | POA: Diagnosis not present

## 2015-11-19 DIAGNOSIS — I10 Essential (primary) hypertension: Secondary | ICD-10-CM | POA: Diagnosis not present

## 2015-11-19 DIAGNOSIS — E119 Type 2 diabetes mellitus without complications: Secondary | ICD-10-CM | POA: Diagnosis not present

## 2015-11-19 DIAGNOSIS — M6281 Muscle weakness (generalized): Secondary | ICD-10-CM | POA: Diagnosis not present

## 2015-11-21 DIAGNOSIS — F419 Anxiety disorder, unspecified: Secondary | ICD-10-CM | POA: Diagnosis not present

## 2015-11-21 DIAGNOSIS — R269 Unspecified abnormalities of gait and mobility: Secondary | ICD-10-CM | POA: Diagnosis not present

## 2015-11-21 DIAGNOSIS — M6281 Muscle weakness (generalized): Secondary | ICD-10-CM | POA: Diagnosis not present

## 2015-11-21 DIAGNOSIS — I10 Essential (primary) hypertension: Secondary | ICD-10-CM | POA: Diagnosis not present

## 2015-11-21 DIAGNOSIS — E119 Type 2 diabetes mellitus without complications: Secondary | ICD-10-CM | POA: Diagnosis not present

## 2015-11-21 DIAGNOSIS — F331 Major depressive disorder, recurrent, moderate: Secondary | ICD-10-CM | POA: Diagnosis not present

## 2015-11-24 ENCOUNTER — Encounter: Payer: Self-pay | Admitting: Family Medicine

## 2015-11-24 DIAGNOSIS — F419 Anxiety disorder, unspecified: Secondary | ICD-10-CM | POA: Diagnosis not present

## 2015-11-24 DIAGNOSIS — E119 Type 2 diabetes mellitus without complications: Secondary | ICD-10-CM | POA: Diagnosis not present

## 2015-11-24 DIAGNOSIS — F331 Major depressive disorder, recurrent, moderate: Secondary | ICD-10-CM | POA: Diagnosis not present

## 2015-11-24 DIAGNOSIS — M6281 Muscle weakness (generalized): Secondary | ICD-10-CM | POA: Diagnosis not present

## 2015-11-24 DIAGNOSIS — R269 Unspecified abnormalities of gait and mobility: Secondary | ICD-10-CM | POA: Diagnosis not present

## 2015-11-24 DIAGNOSIS — I10 Essential (primary) hypertension: Secondary | ICD-10-CM | POA: Diagnosis not present

## 2015-11-25 NOTE — Telephone Encounter (Signed)
Please call the home health company and increase the nursing visits back to twice a week for medication management (she is a diabetic on insulin)

## 2015-11-26 DIAGNOSIS — E119 Type 2 diabetes mellitus without complications: Secondary | ICD-10-CM | POA: Diagnosis not present

## 2015-11-26 DIAGNOSIS — F331 Major depressive disorder, recurrent, moderate: Secondary | ICD-10-CM | POA: Diagnosis not present

## 2015-11-26 DIAGNOSIS — M6281 Muscle weakness (generalized): Secondary | ICD-10-CM | POA: Diagnosis not present

## 2015-11-26 DIAGNOSIS — I10 Essential (primary) hypertension: Secondary | ICD-10-CM | POA: Diagnosis not present

## 2015-11-26 DIAGNOSIS — R269 Unspecified abnormalities of gait and mobility: Secondary | ICD-10-CM | POA: Diagnosis not present

## 2015-11-26 DIAGNOSIS — F419 Anxiety disorder, unspecified: Secondary | ICD-10-CM | POA: Diagnosis not present

## 2015-11-27 NOTE — Telephone Encounter (Signed)
There was a form that we filled out and faxed back.

## 2015-11-28 DIAGNOSIS — M6281 Muscle weakness (generalized): Secondary | ICD-10-CM | POA: Diagnosis not present

## 2015-11-28 DIAGNOSIS — R269 Unspecified abnormalities of gait and mobility: Secondary | ICD-10-CM | POA: Diagnosis not present

## 2015-11-28 DIAGNOSIS — F419 Anxiety disorder, unspecified: Secondary | ICD-10-CM | POA: Diagnosis not present

## 2015-11-28 DIAGNOSIS — F331 Major depressive disorder, recurrent, moderate: Secondary | ICD-10-CM | POA: Diagnosis not present

## 2015-11-28 DIAGNOSIS — I10 Essential (primary) hypertension: Secondary | ICD-10-CM | POA: Diagnosis not present

## 2015-11-28 DIAGNOSIS — E119 Type 2 diabetes mellitus without complications: Secondary | ICD-10-CM | POA: Diagnosis not present

## 2015-12-02 DIAGNOSIS — M6281 Muscle weakness (generalized): Secondary | ICD-10-CM | POA: Diagnosis not present

## 2015-12-02 DIAGNOSIS — I10 Essential (primary) hypertension: Secondary | ICD-10-CM | POA: Diagnosis not present

## 2015-12-02 DIAGNOSIS — E119 Type 2 diabetes mellitus without complications: Secondary | ICD-10-CM | POA: Diagnosis not present

## 2015-12-02 DIAGNOSIS — F419 Anxiety disorder, unspecified: Secondary | ICD-10-CM | POA: Diagnosis not present

## 2015-12-02 DIAGNOSIS — R269 Unspecified abnormalities of gait and mobility: Secondary | ICD-10-CM | POA: Diagnosis not present

## 2015-12-02 DIAGNOSIS — F331 Major depressive disorder, recurrent, moderate: Secondary | ICD-10-CM | POA: Diagnosis not present

## 2015-12-03 DIAGNOSIS — I10 Essential (primary) hypertension: Secondary | ICD-10-CM | POA: Diagnosis not present

## 2015-12-03 DIAGNOSIS — R269 Unspecified abnormalities of gait and mobility: Secondary | ICD-10-CM | POA: Diagnosis not present

## 2015-12-03 DIAGNOSIS — M6281 Muscle weakness (generalized): Secondary | ICD-10-CM | POA: Diagnosis not present

## 2015-12-03 DIAGNOSIS — F331 Major depressive disorder, recurrent, moderate: Secondary | ICD-10-CM | POA: Diagnosis not present

## 2015-12-03 DIAGNOSIS — E119 Type 2 diabetes mellitus without complications: Secondary | ICD-10-CM | POA: Diagnosis not present

## 2015-12-03 DIAGNOSIS — F419 Anxiety disorder, unspecified: Secondary | ICD-10-CM | POA: Diagnosis not present

## 2015-12-05 DIAGNOSIS — F331 Major depressive disorder, recurrent, moderate: Secondary | ICD-10-CM | POA: Diagnosis not present

## 2015-12-05 DIAGNOSIS — F419 Anxiety disorder, unspecified: Secondary | ICD-10-CM | POA: Diagnosis not present

## 2015-12-05 DIAGNOSIS — I10 Essential (primary) hypertension: Secondary | ICD-10-CM | POA: Diagnosis not present

## 2015-12-05 DIAGNOSIS — M6281 Muscle weakness (generalized): Secondary | ICD-10-CM | POA: Diagnosis not present

## 2015-12-05 DIAGNOSIS — R269 Unspecified abnormalities of gait and mobility: Secondary | ICD-10-CM | POA: Diagnosis not present

## 2015-12-05 DIAGNOSIS — E119 Type 2 diabetes mellitus without complications: Secondary | ICD-10-CM | POA: Diagnosis not present

## 2015-12-09 DIAGNOSIS — R269 Unspecified abnormalities of gait and mobility: Secondary | ICD-10-CM | POA: Diagnosis not present

## 2015-12-09 DIAGNOSIS — F419 Anxiety disorder, unspecified: Secondary | ICD-10-CM | POA: Diagnosis not present

## 2015-12-09 DIAGNOSIS — M6281 Muscle weakness (generalized): Secondary | ICD-10-CM | POA: Diagnosis not present

## 2015-12-09 DIAGNOSIS — I10 Essential (primary) hypertension: Secondary | ICD-10-CM | POA: Diagnosis not present

## 2015-12-09 DIAGNOSIS — E119 Type 2 diabetes mellitus without complications: Secondary | ICD-10-CM | POA: Diagnosis not present

## 2015-12-09 DIAGNOSIS — F331 Major depressive disorder, recurrent, moderate: Secondary | ICD-10-CM | POA: Diagnosis not present

## 2015-12-10 DIAGNOSIS — I10 Essential (primary) hypertension: Secondary | ICD-10-CM | POA: Diagnosis not present

## 2015-12-10 DIAGNOSIS — E119 Type 2 diabetes mellitus without complications: Secondary | ICD-10-CM | POA: Diagnosis not present

## 2015-12-10 DIAGNOSIS — F331 Major depressive disorder, recurrent, moderate: Secondary | ICD-10-CM | POA: Diagnosis not present

## 2015-12-10 DIAGNOSIS — R269 Unspecified abnormalities of gait and mobility: Secondary | ICD-10-CM | POA: Diagnosis not present

## 2015-12-10 DIAGNOSIS — M6281 Muscle weakness (generalized): Secondary | ICD-10-CM | POA: Diagnosis not present

## 2015-12-10 DIAGNOSIS — F419 Anxiety disorder, unspecified: Secondary | ICD-10-CM | POA: Diagnosis not present

## 2015-12-11 DIAGNOSIS — F331 Major depressive disorder, recurrent, moderate: Secondary | ICD-10-CM | POA: Diagnosis not present

## 2015-12-11 DIAGNOSIS — E119 Type 2 diabetes mellitus without complications: Secondary | ICD-10-CM | POA: Diagnosis not present

## 2015-12-11 DIAGNOSIS — R269 Unspecified abnormalities of gait and mobility: Secondary | ICD-10-CM | POA: Diagnosis not present

## 2015-12-11 DIAGNOSIS — I10 Essential (primary) hypertension: Secondary | ICD-10-CM | POA: Diagnosis not present

## 2015-12-11 DIAGNOSIS — M6281 Muscle weakness (generalized): Secondary | ICD-10-CM | POA: Diagnosis not present

## 2015-12-11 DIAGNOSIS — F419 Anxiety disorder, unspecified: Secondary | ICD-10-CM | POA: Diagnosis not present

## 2015-12-16 DIAGNOSIS — I10 Essential (primary) hypertension: Secondary | ICD-10-CM | POA: Diagnosis not present

## 2015-12-16 DIAGNOSIS — F331 Major depressive disorder, recurrent, moderate: Secondary | ICD-10-CM | POA: Diagnosis not present

## 2015-12-16 DIAGNOSIS — F419 Anxiety disorder, unspecified: Secondary | ICD-10-CM | POA: Diagnosis not present

## 2015-12-16 DIAGNOSIS — R269 Unspecified abnormalities of gait and mobility: Secondary | ICD-10-CM | POA: Diagnosis not present

## 2015-12-16 DIAGNOSIS — E119 Type 2 diabetes mellitus without complications: Secondary | ICD-10-CM | POA: Diagnosis not present

## 2015-12-16 DIAGNOSIS — M6281 Muscle weakness (generalized): Secondary | ICD-10-CM | POA: Diagnosis not present

## 2015-12-18 DIAGNOSIS — F331 Major depressive disorder, recurrent, moderate: Secondary | ICD-10-CM | POA: Diagnosis not present

## 2015-12-18 DIAGNOSIS — E119 Type 2 diabetes mellitus without complications: Secondary | ICD-10-CM | POA: Diagnosis not present

## 2015-12-18 DIAGNOSIS — M6281 Muscle weakness (generalized): Secondary | ICD-10-CM | POA: Diagnosis not present

## 2015-12-18 DIAGNOSIS — F419 Anxiety disorder, unspecified: Secondary | ICD-10-CM | POA: Diagnosis not present

## 2015-12-18 DIAGNOSIS — R269 Unspecified abnormalities of gait and mobility: Secondary | ICD-10-CM | POA: Diagnosis not present

## 2015-12-18 DIAGNOSIS — I10 Essential (primary) hypertension: Secondary | ICD-10-CM | POA: Diagnosis not present

## 2015-12-22 DIAGNOSIS — M6281 Muscle weakness (generalized): Secondary | ICD-10-CM | POA: Diagnosis not present

## 2015-12-22 DIAGNOSIS — I10 Essential (primary) hypertension: Secondary | ICD-10-CM | POA: Diagnosis not present

## 2015-12-22 DIAGNOSIS — F331 Major depressive disorder, recurrent, moderate: Secondary | ICD-10-CM | POA: Diagnosis not present

## 2015-12-22 DIAGNOSIS — E119 Type 2 diabetes mellitus without complications: Secondary | ICD-10-CM | POA: Diagnosis not present

## 2015-12-22 DIAGNOSIS — R269 Unspecified abnormalities of gait and mobility: Secondary | ICD-10-CM | POA: Diagnosis not present

## 2015-12-22 DIAGNOSIS — F419 Anxiety disorder, unspecified: Secondary | ICD-10-CM | POA: Diagnosis not present

## 2015-12-29 DIAGNOSIS — I10 Essential (primary) hypertension: Secondary | ICD-10-CM | POA: Diagnosis not present

## 2015-12-29 DIAGNOSIS — M6281 Muscle weakness (generalized): Secondary | ICD-10-CM | POA: Diagnosis not present

## 2015-12-29 DIAGNOSIS — E119 Type 2 diabetes mellitus without complications: Secondary | ICD-10-CM | POA: Diagnosis not present

## 2015-12-29 DIAGNOSIS — F419 Anxiety disorder, unspecified: Secondary | ICD-10-CM | POA: Diagnosis not present

## 2015-12-29 DIAGNOSIS — F331 Major depressive disorder, recurrent, moderate: Secondary | ICD-10-CM | POA: Diagnosis not present

## 2015-12-29 DIAGNOSIS — R269 Unspecified abnormalities of gait and mobility: Secondary | ICD-10-CM | POA: Diagnosis not present

## 2015-12-30 DIAGNOSIS — F331 Major depressive disorder, recurrent, moderate: Secondary | ICD-10-CM | POA: Diagnosis not present

## 2015-12-30 DIAGNOSIS — Z915 Personal history of self-harm: Secondary | ICD-10-CM | POA: Diagnosis not present

## 2015-12-30 DIAGNOSIS — K219 Gastro-esophageal reflux disease without esophagitis: Secondary | ICD-10-CM | POA: Diagnosis not present

## 2015-12-30 DIAGNOSIS — J449 Chronic obstructive pulmonary disease, unspecified: Secondary | ICD-10-CM | POA: Diagnosis not present

## 2015-12-30 DIAGNOSIS — Z794 Long term (current) use of insulin: Secondary | ICD-10-CM | POA: Diagnosis not present

## 2015-12-30 DIAGNOSIS — E119 Type 2 diabetes mellitus without complications: Secondary | ICD-10-CM | POA: Diagnosis not present

## 2015-12-30 DIAGNOSIS — F419 Anxiety disorder, unspecified: Secondary | ICD-10-CM | POA: Diagnosis not present

## 2015-12-30 DIAGNOSIS — F4310 Post-traumatic stress disorder, unspecified: Secondary | ICD-10-CM | POA: Diagnosis not present

## 2015-12-30 DIAGNOSIS — I10 Essential (primary) hypertension: Secondary | ICD-10-CM | POA: Diagnosis not present

## 2015-12-30 DIAGNOSIS — Z87891 Personal history of nicotine dependence: Secondary | ICD-10-CM | POA: Diagnosis not present

## 2016-01-07 ENCOUNTER — Telehealth: Payer: Self-pay

## 2016-01-07 NOTE — Telephone Encounter (Signed)
Pt requesting diazepam (VALIUM) 5 MG tablet Pt last visit 11/10/15 Pt last refills 07/09/15

## 2016-01-07 NOTE — Telephone Encounter (Signed)
Call in #[phone] with 5 rf 

## 2016-01-08 MED ORDER — DIAZEPAM 5 MG PO TABS: 5.0000 mg | ORAL_TABLET | Freq: Two times a day (BID) | ORAL | Status: DC | PRN

## 2016-01-08 NOTE — Telephone Encounter (Signed)
Rx was call in  

## 2016-01-12 ENCOUNTER — Encounter: Payer: Self-pay | Admitting: Nutrition

## 2016-01-12 ENCOUNTER — Ambulatory Visit: Payer: Self-pay | Admitting: Endocrinology

## 2016-01-22 ENCOUNTER — Telehealth: Payer: Self-pay | Admitting: Family Medicine

## 2016-01-22 NOTE — Telephone Encounter (Signed)
Cathy from [facility] call to ask for verbal orders for  skill nursing,for diabetic med and teaching and medication management and teaching. Fall prevention nutritional edcuation   [phone]

## 2016-01-23 DIAGNOSIS — K219 Gastro-esophageal reflux disease without esophagitis: Secondary | ICD-10-CM | POA: Diagnosis not present

## 2016-01-23 DIAGNOSIS — J310 Chronic rhinitis: Secondary | ICD-10-CM | POA: Diagnosis not present

## 2016-01-23 DIAGNOSIS — R5383 Other fatigue: Secondary | ICD-10-CM | POA: Diagnosis not present

## 2016-01-23 DIAGNOSIS — G4733 Obstructive sleep apnea (adult) (pediatric): Secondary | ICD-10-CM | POA: Diagnosis not present

## 2016-01-23 DIAGNOSIS — J4520 Mild intermittent asthma, uncomplicated: Secondary | ICD-10-CM | POA: Diagnosis not present

## 2016-01-23 NOTE — Telephone Encounter (Signed)
Please set this up

## 2016-01-23 NOTE — Telephone Encounter (Signed)
I spoke with Arline Asp and gave the verbal order. She will fax over the form for Dr. [name] to sign.

## 2016-02-05 ENCOUNTER — Other Ambulatory Visit: Payer: Self-pay

## 2016-02-05 MED ORDER — INSULIN PEN NEEDLE 31G X 8 MM MISC: Status: DC

## 2016-02-09 ENCOUNTER — Ambulatory Visit (INDEPENDENT_AMBULATORY_CARE_PROVIDER_SITE_OTHER): Payer: Medicare Other | Admitting: Family Medicine

## 2016-02-09 ENCOUNTER — Encounter: Payer: Self-pay | Admitting: Family Medicine

## 2016-02-09 ENCOUNTER — Telehealth: Payer: Self-pay | Admitting: Family Medicine

## 2016-02-09 VITALS — BP 129/80 | HR 74 | Temp 98.5°F | Ht 64.5 in | Wt 134.0 lb

## 2016-02-09 DIAGNOSIS — F329 Major depressive disorder, single episode, unspecified: Secondary | ICD-10-CM | POA: Diagnosis not present

## 2016-02-09 DIAGNOSIS — F32A Depression, unspecified: Secondary | ICD-10-CM

## 2016-02-09 DIAGNOSIS — F4310 Post-traumatic stress disorder, unspecified: Secondary | ICD-10-CM | POA: Diagnosis not present

## 2016-02-09 DIAGNOSIS — I10 Essential (primary) hypertension: Secondary | ICD-10-CM

## 2016-02-09 MED ORDER — DIAZEPAM 5 MG PO TABS: 5.0000 mg | ORAL_TABLET | Freq: Three times a day (TID) | ORAL | Status: DC | PRN

## 2016-02-09 MED ORDER — DESVENLAFAXINE SUCCINATE ER 50 MG PO TB24: 50.0000 mg | ORAL_TABLET | Freq: Every day | ORAL | Status: AC

## 2016-02-09 NOTE — Progress Notes (Signed)
   Subjective:    Patient ID: Isabel [name], female    DOB: [birth year], 58 y.o.   MRN: [id number]  HPI Here to follow up on bipolar depression and HTN. Her BP has been stable. She sees Dr. Everardo AllEllison for diabetes management. Per her cousin she has been struggling with anxiety and depression lately. She has been sleeping fairly well but her moods have been labile and she is tearful often.    Review of Systems  Constitutional: Negative.   Respiratory: Negative.   Cardiovascular: Negative.   Neurological: Negative.   Psychiatric/Behavioral: Positive for dysphoric mood. Negative for suicidal ideas, hallucinations, behavioral problems, confusion, self-injury, decreased concentration and agitation. The patient is nervous/anxious.        Objective:   Physical Exam  Constitutional: She is oriented to person, place, and time. She appears well-developed and well-nourished.  Cardiovascular: Normal rate, regular rhythm, normal heart sounds and intact distal pulses.   Pulmonary/Chest: Effort normal and breath sounds normal.  Neurological: She is alert and oriented to person, place, and time.  Psychiatric: She has a normal mood and affect. Her behavior is normal. Thought content normal.          Assessment & Plan:  Her HTN is stable. For the depression we will stop thew nightly Trazadone. Increase Pristiq to a total of 150 mg daily and increase the Valium to 5 mg TID. Recheck in 3 months

## 2016-02-09 NOTE — Progress Notes (Signed)
Pre visit review using our clinic review tool, if applicable. No additional management support is needed unless otherwise documented below in the visit note. 

## 2016-02-09 NOTE — Telephone Encounter (Signed)
Cindy with [facility] would like verbal orders for pt to have another episode of care, Which is 60 days. OK to leave message.

## 2016-02-10 DIAGNOSIS — G4733 Obstructive sleep apnea (adult) (pediatric): Secondary | ICD-10-CM | POA: Diagnosis not present

## 2016-02-10 DIAGNOSIS — J4520 Mild intermittent asthma, uncomplicated: Secondary | ICD-10-CM | POA: Diagnosis not present

## 2016-02-10 DIAGNOSIS — R5383 Other fatigue: Secondary | ICD-10-CM | POA: Diagnosis not present

## 2016-02-10 DIAGNOSIS — K219 Gastro-esophageal reflux disease without esophagitis: Secondary | ICD-10-CM | POA: Diagnosis not present

## 2016-02-10 DIAGNOSIS — J310 Chronic rhinitis: Secondary | ICD-10-CM | POA: Diagnosis not present

## 2016-02-10 NOTE — Telephone Encounter (Signed)
Per Dr. Clent RidgesFry okay to give verbal order. I did speak with Arline AspCindy from [facility] and gave verbal order.

## 2016-02-18 ENCOUNTER — Telehealth: Payer: Self-pay | Admitting: Family Medicine

## 2016-02-18 NOTE — Telephone Encounter (Signed)
[facility] and [facility] will go over below information with pt.

## 2016-02-18 NOTE — Telephone Encounter (Signed)
Holly from [facility] called to report that the pt has new cut marks on both arms.  She stated that she does this when she is mad or upset.  She is not suicidal at this time.  Noticed in her chart that medications have been recently changed.  Diazepam has been increased to every 8 hours from 12 hours.  Pristiq has been decreased from 100 mg to 50 mg.  She is not taking the trazodone.  Jeanice LimHolly would like to know how to proceed.  I did advise getting in touch with psych.  Jeanice LimHolly will send her to the ER if she becomes suicidal.  Any other advisement?

## 2016-02-18 NOTE — Telephone Encounter (Signed)
When we saw her recently we actually increased the daily dose of her Pristiq to 150 mg (to take one 50 and one 100), so please find out if that is what she is taking. If so, have them give us an update on her early next week

## 2016-02-27 ENCOUNTER — Telehealth: Payer: Self-pay | Admitting: Family Medicine

## 2016-02-27 NOTE — Telephone Encounter (Signed)
I called Isabel [name] and informed her of the message below. 

## 2016-02-27 NOTE — Telephone Encounter (Signed)
Please give them verbal orders to continue current services

## 2016-02-27 NOTE — Telephone Encounter (Signed)
Cindy from [facility] call to ask for verbal orders to keep seeing the patient.    [phone]

## 2016-02-28 DIAGNOSIS — K219 Gastro-esophageal reflux disease without esophagitis: Secondary | ICD-10-CM | POA: Diagnosis not present

## 2016-02-28 DIAGNOSIS — E119 Type 2 diabetes mellitus without complications: Secondary | ICD-10-CM | POA: Diagnosis not present

## 2016-02-28 DIAGNOSIS — F4310 Post-traumatic stress disorder, unspecified: Secondary | ICD-10-CM | POA: Diagnosis not present

## 2016-02-28 DIAGNOSIS — F419 Anxiety disorder, unspecified: Secondary | ICD-10-CM | POA: Diagnosis not present

## 2016-02-28 DIAGNOSIS — Z794 Long term (current) use of insulin: Secondary | ICD-10-CM | POA: Diagnosis not present

## 2016-02-28 DIAGNOSIS — J449 Chronic obstructive pulmonary disease, unspecified: Secondary | ICD-10-CM | POA: Diagnosis not present

## 2016-02-28 DIAGNOSIS — I10 Essential (primary) hypertension: Secondary | ICD-10-CM | POA: Diagnosis not present

## 2016-02-28 DIAGNOSIS — Z87891 Personal history of nicotine dependence: Secondary | ICD-10-CM | POA: Diagnosis not present

## 2016-02-28 DIAGNOSIS — F331 Major depressive disorder, recurrent, moderate: Secondary | ICD-10-CM | POA: Diagnosis not present

## 2016-02-28 DIAGNOSIS — Z915 Personal history of self-harm: Secondary | ICD-10-CM | POA: Diagnosis not present

## 2016-03-01 DIAGNOSIS — F419 Anxiety disorder, unspecified: Secondary | ICD-10-CM | POA: Diagnosis not present

## 2016-03-01 DIAGNOSIS — F4310 Post-traumatic stress disorder, unspecified: Secondary | ICD-10-CM | POA: Diagnosis not present

## 2016-03-01 DIAGNOSIS — E119 Type 2 diabetes mellitus without complications: Secondary | ICD-10-CM | POA: Diagnosis not present

## 2016-03-01 DIAGNOSIS — I10 Essential (primary) hypertension: Secondary | ICD-10-CM | POA: Diagnosis not present

## 2016-03-01 DIAGNOSIS — J449 Chronic obstructive pulmonary disease, unspecified: Secondary | ICD-10-CM | POA: Diagnosis not present

## 2016-03-01 DIAGNOSIS — F331 Major depressive disorder, recurrent, moderate: Secondary | ICD-10-CM | POA: Diagnosis not present

## 2016-03-10 DIAGNOSIS — F331 Major depressive disorder, recurrent, moderate: Secondary | ICD-10-CM | POA: Diagnosis not present

## 2016-03-10 DIAGNOSIS — J449 Chronic obstructive pulmonary disease, unspecified: Secondary | ICD-10-CM | POA: Diagnosis not present

## 2016-03-10 DIAGNOSIS — F419 Anxiety disorder, unspecified: Secondary | ICD-10-CM | POA: Diagnosis not present

## 2016-03-10 DIAGNOSIS — E119 Type 2 diabetes mellitus without complications: Secondary | ICD-10-CM | POA: Diagnosis not present

## 2016-03-10 DIAGNOSIS — F4310 Post-traumatic stress disorder, unspecified: Secondary | ICD-10-CM | POA: Diagnosis not present

## 2016-03-10 DIAGNOSIS — I10 Essential (primary) hypertension: Secondary | ICD-10-CM | POA: Diagnosis not present

## 2016-03-16 DIAGNOSIS — F419 Anxiety disorder, unspecified: Secondary | ICD-10-CM | POA: Diagnosis not present

## 2016-03-16 DIAGNOSIS — J449 Chronic obstructive pulmonary disease, unspecified: Secondary | ICD-10-CM | POA: Diagnosis not present

## 2016-03-16 DIAGNOSIS — F331 Major depressive disorder, recurrent, moderate: Secondary | ICD-10-CM | POA: Diagnosis not present

## 2016-03-16 DIAGNOSIS — F4310 Post-traumatic stress disorder, unspecified: Secondary | ICD-10-CM | POA: Diagnosis not present

## 2016-03-16 DIAGNOSIS — E119 Type 2 diabetes mellitus without complications: Secondary | ICD-10-CM | POA: Diagnosis not present

## 2016-03-16 DIAGNOSIS — I10 Essential (primary) hypertension: Secondary | ICD-10-CM | POA: Diagnosis not present

## 2016-03-25 ENCOUNTER — Telehealth: Payer: Self-pay | Admitting: Family Medicine

## 2016-03-25 DIAGNOSIS — F4310 Post-traumatic stress disorder, unspecified: Secondary | ICD-10-CM | POA: Diagnosis not present

## 2016-03-25 DIAGNOSIS — E119 Type 2 diabetes mellitus without complications: Secondary | ICD-10-CM | POA: Diagnosis not present

## 2016-03-25 DIAGNOSIS — J449 Chronic obstructive pulmonary disease, unspecified: Secondary | ICD-10-CM | POA: Diagnosis not present

## 2016-03-25 DIAGNOSIS — F419 Anxiety disorder, unspecified: Secondary | ICD-10-CM | POA: Diagnosis not present

## 2016-03-25 DIAGNOSIS — I10 Essential (primary) hypertension: Secondary | ICD-10-CM | POA: Diagnosis not present

## 2016-03-25 DIAGNOSIS — F331 Major depressive disorder, recurrent, moderate: Secondary | ICD-10-CM | POA: Diagnosis not present

## 2016-03-25 NOTE — Telephone Encounter (Signed)
[facility] - Client TELEPHONE ADVICE RECORD [facility] Patient Name: Liliane ShiERRY Yokoyama DOB: 1956-03-18 Initial Comment Caller states is Ranette Rodden, Nurse w/ [facility], blood sugar is greater than 600 and the only thing that she has in the home is the SYSC[employer]ovolog FlexPen. Nurse Assessment Nurse: Debera Latalston, RN, Tinnie GensJeffrey Date/Time Lamount Cohen(Eastern Time): 03/25/2016 4:51:08 PM Confirm and document reason for call. If symptomatic, describe symptoms. You must click the next button to save text entered. ---Caller states is Ranette Rodden, Nurse w/[facility], blood sugar is greater than 600 and the only thing that she has in the home is the SYSCOovolog FlexPen. No BS reading since 4/11. No symptoms. Has the patient traveled out of the country within the last 30 days? ---No Does the patient have any new or worsening symptoms? ---Yes Will a triage be completed? ---Yes Related visit to physician within the last 2 weeks? ---No Does the PT have any chronic conditions? (i.e. diabetes, asthma, etc.) ---Yes List chronic conditions. ---DDM 2, HTN, depression, anxiety Is this a behavioral health or substance abuse call? ---No Guidelines Guideline Title Affirmed Question Affirmed Notes Diabetes - High Blood Sugar Blood glucose > 500 mg/dl (16.127.5 mmol/l) Final Disposition User Go to ED Now (or PCP triage) Debera Latalston, RN, Tinnie GensJeffrey Comments [facility].

## 2016-03-26 ENCOUNTER — Telehealth: Payer: Self-pay | Admitting: Endocrinology

## 2016-03-26 ENCOUNTER — Telehealth: Payer: Self-pay

## 2016-03-26 ENCOUNTER — Other Ambulatory Visit: Payer: Self-pay

## 2016-03-26 MED ORDER — ACCU-CHEK MULTICLIX LANCETS MISC: Status: DC

## 2016-03-26 MED ORDER — ACCU-CHEK AVIVA PLUS W/DEVICE KIT: PACK | Status: DC

## 2016-03-26 MED ORDER — ONETOUCH VERIO W/DEVICE KIT: 1.0000 | PACK | Freq: Once | Status: DC

## 2016-03-26 MED ORDER — GLUCOSE BLOOD VI STRP: ORAL_STRIP | Status: DC

## 2016-03-26 NOTE — Telephone Encounter (Signed)
Attempted to reach the pt. Pt was unavailable will try at a late time.

## 2016-03-26 NOTE — Telephone Encounter (Signed)
ERROR

## 2016-03-26 NOTE — Telephone Encounter (Signed)
Pt and her caretaker has concerns that the meter she currently has is not accurate can we please call in a rx for a new meter one touch verio [facility] [place] [facility] [place].

## 2016-03-26 NOTE — Telephone Encounter (Signed)
Spoke to [place]isha per DPR.  The patient did not go to the ER because of lack of transportation.  Patient took her insuline 1 hour after speaking with the nurse last night, and her glucose was 370.  Patient checked her glucose this morning and it is 381.  Please advise.

## 2016-03-26 NOTE — Telephone Encounter (Signed)
I contacted Bethann Berkshirerisha and advised of new instructions below. She voiced understanding. I attempted to schedule the appointment for 04/05/2016, but Bethann Berkshirerisha denied making the appointment at this time and stated she would call back once she verify's what time she can bring the pt.

## 2016-03-26 NOTE — Telephone Encounter (Signed)
Isabel [name] is will wait for Dr George HughEllison's call and mean time monitor Mrs Sanley's glucose levels.

## 2016-03-26 NOTE — Telephone Encounter (Signed)
Attempted to call patient for an update. Unable to contact patient due to no voicemail set up at this time. FYI

## 2016-03-26 NOTE — Telephone Encounter (Signed)
Ok, please make that appt. In the meantime, increase novolog 70/30 to 30 units with breakfast, and 25 units with the evening meal. Did you get a steroid shot or prescription?

## 2016-03-26 NOTE — Telephone Encounter (Signed)
sent 

## 2016-03-26 NOTE — Telephone Encounter (Signed)
Ov today.

## 2016-03-26 NOTE — Telephone Encounter (Signed)
This should be directed to Dr. Everardo AllEllison, since he is treating her diabetes

## 2016-03-26 NOTE — Telephone Encounter (Signed)
I contacted Isabel [name] and she stated the pt would not be able to come in for an appointment until the first Monday in May. She stated the pt's fasting blood sugar this morning as 380 and last night after dinner the blood sugar was 370. Isabel [name] stated the pt's blood sugar went up to 600 on 4/20 due to her missing her morning novolog 70/30 dosage. Pt is currently taking 20 units of novolog 70/30 in the morning and 15 units in the evening. Please advise, Thanks!

## 2016-03-26 NOTE — Telephone Encounter (Signed)
Attempted to call Isabel [name] but unable to leave a message

## 2016-03-29 ENCOUNTER — Telehealth: Payer: Self-pay | Admitting: Endocrinology

## 2016-03-29 MED ORDER — GLUCOSE BLOOD VI STRP: ORAL_STRIP | Status: AC

## 2016-03-29 MED ORDER — ACCU-CHEK AVIVA PLUS W/DEVICE KIT: PACK | Status: AC

## 2016-03-29 MED ORDER — ACCU-CHEK MULTICLIX LANCETS MISC: Status: AC

## 2016-03-29 NOTE — Telephone Encounter (Signed)
The insulin adjustment is causing lows using the old meter it says she is reading 45-60 since Saturday, Sunday was running 70 in the AM and 60 all day, today it is low in the 60s what is she to do about the insulin

## 2016-03-29 NOTE — Telephone Encounter (Signed)
I contacted the pt's care taker and the dosage at this time is 30 units in the morning and 25 units in the evening.

## 2016-03-29 NOTE — Telephone Encounter (Signed)
Ok, please decrease to 25 units in the morning and 25 units in the evening.  Needs f/u appt this week

## 2016-03-29 NOTE — Telephone Encounter (Signed)
See below

## 2016-03-29 NOTE — Telephone Encounter (Signed)
See note below and please advise, Thanks! 

## 2016-03-29 NOTE — Telephone Encounter (Signed)
Pt's caretaker advised to take 25 units BID. She stated the pt would not be able to come to the office this week. The only day she can come at this time is 04/12/2016 due to transportation.

## 2016-03-29 NOTE — Telephone Encounter (Signed)
Caregiver asking for an emergent call back regarding insulin

## 2016-03-29 NOTE — Telephone Encounter (Signed)
ok 

## 2016-03-29 NOTE — Telephone Encounter (Signed)
25-BID, please

## 2016-03-29 NOTE — Addendum Note (Signed)
Addended by: Ann MakiBAILEY, Reginold Beale T on: 03/29/2016 12:04 PM   Modules accepted: Orders

## 2016-03-29 NOTE — Telephone Encounter (Signed)
Please verify current insulin is 25 units with breakfast, and 15 units with the evening meal. Then decrease to 22 units with breakfast, and 12 units with the evening meal. Needs f/u ov < 2 weeks

## 2016-03-29 NOTE — Telephone Encounter (Signed)
Wanted to verify. Should the dosage be 25 units in the morning and 25 units in the evening or 25 u in the morning and 15 units in the evening.

## 2016-03-31 DIAGNOSIS — I10 Essential (primary) hypertension: Secondary | ICD-10-CM | POA: Diagnosis not present

## 2016-03-31 DIAGNOSIS — F331 Major depressive disorder, recurrent, moderate: Secondary | ICD-10-CM | POA: Diagnosis not present

## 2016-03-31 DIAGNOSIS — F419 Anxiety disorder, unspecified: Secondary | ICD-10-CM | POA: Diagnosis not present

## 2016-03-31 DIAGNOSIS — J449 Chronic obstructive pulmonary disease, unspecified: Secondary | ICD-10-CM | POA: Diagnosis not present

## 2016-03-31 DIAGNOSIS — E119 Type 2 diabetes mellitus without complications: Secondary | ICD-10-CM | POA: Diagnosis not present

## 2016-03-31 DIAGNOSIS — F4310 Post-traumatic stress disorder, unspecified: Secondary | ICD-10-CM | POA: Diagnosis not present

## 2016-04-06 DIAGNOSIS — F419 Anxiety disorder, unspecified: Secondary | ICD-10-CM | POA: Diagnosis not present

## 2016-04-06 DIAGNOSIS — F4310 Post-traumatic stress disorder, unspecified: Secondary | ICD-10-CM | POA: Diagnosis not present

## 2016-04-06 DIAGNOSIS — E119 Type 2 diabetes mellitus without complications: Secondary | ICD-10-CM | POA: Diagnosis not present

## 2016-04-06 DIAGNOSIS — J449 Chronic obstructive pulmonary disease, unspecified: Secondary | ICD-10-CM | POA: Diagnosis not present

## 2016-04-06 DIAGNOSIS — I10 Essential (primary) hypertension: Secondary | ICD-10-CM | POA: Diagnosis not present

## 2016-04-06 DIAGNOSIS — F331 Major depressive disorder, recurrent, moderate: Secondary | ICD-10-CM | POA: Diagnosis not present

## 2016-04-12 ENCOUNTER — Ambulatory Visit (INDEPENDENT_AMBULATORY_CARE_PROVIDER_SITE_OTHER): Payer: Medicare Other | Admitting: Endocrinology

## 2016-04-12 ENCOUNTER — Encounter: Payer: Self-pay | Admitting: Endocrinology

## 2016-04-12 VITALS — BP 122/90 | HR 76 | Ht 64.5 in | Wt 136.8 lb

## 2016-04-12 DIAGNOSIS — F70 Mild intellectual disabilities: Secondary | ICD-10-CM

## 2016-04-12 DIAGNOSIS — E1043 Type 1 diabetes mellitus with diabetic autonomic (poly)neuropathy: Secondary | ICD-10-CM

## 2016-04-12 LAB — POCT GLYCOSYLATED HEMOGLOBIN (HGB A1C): HEMOGLOBIN A1C: 10.3

## 2016-04-12 MED ORDER — INSULIN DETEMIR 100 UNIT/ML FLEXPEN: 45.0000 [IU] | PEN_INJECTOR | SUBCUTANEOUS | Status: DC

## 2016-04-12 NOTE — Progress Notes (Signed)
Subjective:    Patient ID: Isabel [name], female    DOB: 1956-01-11, 60 y.o.   MRN: [id number]  HPI Pt returns for f/u of diabetes mellitus: DM type: 1 Dx'ed: 2011, when she presented with gallstone pancreatitis.   Complications: polyneuropathy.   Therapy: insulin since dx.   GDM: never. DKA: twice (2014 and 2016).  Severe hypoglycemia: never.   Pancreatitis: only once, at dx.  Other: she chose to change to BID premixed insulin, after poor results with multiple daily injections.  She declines pump and continuous glucose monitor.  Interval history:  no cbg record, but states cbg's vary from 57-180.  There is no trend throughout the day.  She says she never misses the insulin.   Past Medical History  Diagnosis Date  . Allergy   . Asthma   . Depression   . GERD (gastroesophageal reflux disease)   . Hypertension   . Cystitis, interstitial   . Pneumonia   . Diabetes mellitus without complication ([place])   . Pancreatic pseudocyst   . Sleep apnea     wears a CPAP at night     Past Surgical History  Procedure Laterality Date  . Cystgastrostomy  09-02-12    per Dr. Radene Knee at [place]  . Marital Status: Single    Spouse Name: N/A  . Number of Children: N/A  . Years of Education: N/A   Occupational History  . Not on file.   Social History Main Topics  . Smoking status: Never Smoker   . Smokeless tobacco: Never Used  . Alcohol Use: No  . Drug Use: No  . Sexual Activity: No   Other Topics Concern  . Not on file   Social History Narrative    Current Outpatient Prescriptions on File Prior to Visit  Medication Sig Dispense Refill  . acetaminophen (TYLENOL) 325 MG tablet Take 2 tablets (650 mg total) by mouth every 6 (six) hours as needed for headache.    . albuterol (PROVENTIL HFA;VENTOLIN HFA) 108 (90 BASE) MCG/ACT inhaler Inhale 2 puffs into the lungs every 6 (six) hours as needed for wheezing or shortness of  breath. 1 Inhaler 11  . albuterol (PROVENTIL) (2.5 MG/3ML) 0.083% nebulizer solution Take 3 mLs (2.5 mg total) by nebulization every 6 (six) hours as needed for wheezing or shortness of breath. 75 mL 11  . aspirin EC 81 MG tablet Take 1 tablet (81 mg total) by mouth daily. 1 tablet 0  . Blood Glucose Monitoring Suppl (ACCU-CHEK AVIVA PLUS) w/Device KIT Use to check blood sugar 4 times per day. Dx code. E11. 1 kit 2  . budesonide-formoterol (SYMBICORT) 160-4.5 MCG/ACT inhaler Inhale 2 puffs into the lungs 2 (two) times daily. For asthma 1 Inhaler 11  . cetirizine (ZYRTEC) 10 MG tablet Take 1 tablet (10 mg total) by mouth daily. For allergies 90 tablet 3  . desvenlafaxine (PRISTIQ) 100 MG 24 hr tablet Take 1 tablet (100 mg total) by mouth daily. 30 tablet 11  . desvenlafaxine (PRISTIQ) 50 MG 24 hr tablet Take 1 tablet (50 mg total) by mouth daily. 30 tablet 11  . diazepam (VALIUM) 5 MG tablet Take 1 tablet (5 mg total) by mouth every 8 (eight) hours as needed for anxiety. 90 tablet 5  . glucose blood (ACCU-CHEK AVIVA PLUS) test strip Use to check blood sugar 4 times per day. Dx Code: E11.9 150 each 2  .  imipramine (TOFRANIL) 50 MG tablet Take 1 tablet (50 mg total) by mouth at bedtime. 30 tablet 11  . Insulin Pen Needle (RELION SHORT PEN NEEDLES) 31G X 8 MM MISC USE AS DIRECTED 200 each 2  . Lancets (ACCU-CHEK MULTICLIX) lancets Use to check blood sugar 4 times per day. Dx Code: E11.9 100 each 12  . mometasone (NASONEX) 50 MCG/ACT nasal spray INSTILL 2 SPRAYS IN EACH NOSTRIL DAILY 51 g 1  . montelukast (SINGULAIR) 10 MG tablet Take 1 tablet (10 mg total) by mouth at bedtime. For asthma 30 tablet 11  . NEXIUM 40 MG capsule Take 1 capsule (40 mg total) by mouth daily at 12 noon. 30 capsule 11  . nystatin cream (MYCOSTATIN) Apply 1 application topically 2 (two) times daily as needed for dry skin (rash). 30 g 0  . oxybutynin (DITROPAN-XL) 10 MG 24 hr tablet Take 1 tablet (10 mg total) by mouth daily. For  bladder spasm 30 tablet 11  . rOPINIRole (REQUIP) 1 MG tablet Take 1 tablet (1 mg total) by mouth at bedtime. For restless leg syndrome 30 tablet 11   No current facility-administered medications on file prior to visit.    Allergies  Allergen Reactions  . Aspirin     REACTION: unspecified  . Latex     rash  . Metformin And Related   . Penicillins     REACTION: unspecified  . Povidone-Iodine     REACTION: unspecified  . Sulfamethoxazole     REACTION: unspecified    Family History  Problem Relation Age of Onset  . Cancer      other    BP 122/90 mmHg  Pulse 76  Ht 5' 4.5" (1.638 m)  Wt 136 lb 12.8 oz (62.052 kg)  BMI 23.13 kg/m2  SpO2 98%  Review of Systems Denies LOC.      Objective:   Physical Exam VITAL SIGNS:  See vs page GENERAL: no distress Pulses: dorsalis pedis intact bilat.   MSK: no deformity of the feet CV: no leg edema.  Skin:  no ulcer on the feet.  normal color and temp on the feet.  Neuro: sensation is intact to touch on the feet, but decreased from normal.     Lab Results  Component Value Date   HGBA1C 10.3 04/12/2016      Assessment & Plan:  DM: improved, but she needs increased rx.  She needs further simplification of her insulin schedule   Patient is advised the following: Patient Instructions  check your blood sugar 4 times a day: before the 3 meals, and at bedtime.  also check if you have symptoms of your blood sugar being too high or too low.  please keep a record of the readings and bring it to your next appointment here.  You can write it on any piece of paper.  please call us sooner if your blood sugar goes below 70, or if you have a lot of readings over 200.   Please change the current insulin to levemir, 45 units each morning.  i have sent a prescription to your pharmacy On this type of insulin schedule, you should eat meals on a regular schedule.  If a meal is missed or significantly delayed, your blood sugar could go low.  Please  come back for a follow-up appointment in 2-3 weeks.  Please see Vaughan Basta the same day.

## 2016-04-12 NOTE — Patient Instructions (Addendum)
check your blood sugar 4 times a day: before the 3 meals, and at bedtime.  also check if you have symptoms of your blood sugar being too high or too low.  please keep a record of the readings and bring it to your next appointment here.  You can write it on any piece of paper.  please call us sooner if your blood sugar goes below 70, or if you have a lot of readings over 200.   Please change the current insulin to levemir, 45 units each morning.  i have sent a prescription to your pharmacy On this type of insulin schedule, you should eat meals on a regular schedule.  If a meal is missed or significantly delayed, your blood sugar could go low.  Please come back for a follow-up appointment in 2-3 weeks.  Please see Isabel [name] the same day.

## 2016-04-13 DIAGNOSIS — E119 Type 2 diabetes mellitus without complications: Secondary | ICD-10-CM | POA: Diagnosis not present

## 2016-04-13 DIAGNOSIS — J449 Chronic obstructive pulmonary disease, unspecified: Secondary | ICD-10-CM | POA: Diagnosis not present

## 2016-04-13 DIAGNOSIS — F331 Major depressive disorder, recurrent, moderate: Secondary | ICD-10-CM | POA: Diagnosis not present

## 2016-04-13 DIAGNOSIS — F4310 Post-traumatic stress disorder, unspecified: Secondary | ICD-10-CM | POA: Diagnosis not present

## 2016-04-13 DIAGNOSIS — I10 Essential (primary) hypertension: Secondary | ICD-10-CM | POA: Diagnosis not present

## 2016-04-13 DIAGNOSIS — F419 Anxiety disorder, unspecified: Secondary | ICD-10-CM | POA: Diagnosis not present

## 2016-04-14 ENCOUNTER — Ambulatory Visit: Payer: Self-pay

## 2016-04-20 ENCOUNTER — Telehealth: Payer: Self-pay | Admitting: Family Medicine

## 2016-04-20 DIAGNOSIS — E119 Type 2 diabetes mellitus without complications: Secondary | ICD-10-CM | POA: Diagnosis not present

## 2016-04-20 DIAGNOSIS — F331 Major depressive disorder, recurrent, moderate: Secondary | ICD-10-CM | POA: Diagnosis not present

## 2016-04-20 DIAGNOSIS — F419 Anxiety disorder, unspecified: Secondary | ICD-10-CM | POA: Diagnosis not present

## 2016-04-20 DIAGNOSIS — I10 Essential (primary) hypertension: Secondary | ICD-10-CM | POA: Diagnosis not present

## 2016-04-20 DIAGNOSIS — J449 Chronic obstructive pulmonary disease, unspecified: Secondary | ICD-10-CM | POA: Diagnosis not present

## 2016-04-20 DIAGNOSIS — F4310 Post-traumatic stress disorder, unspecified: Secondary | ICD-10-CM | POA: Diagnosis not present

## 2016-04-20 NOTE — Telephone Encounter (Signed)
noted 

## 2016-04-20 NOTE — Telephone Encounter (Signed)
Need new set of orders to resume care for the pt under Medicaid pt will not be able to be seen due to the denial under Medicare .  Paper work will be faxed over to the office for new orders.

## 2016-05-04 ENCOUNTER — Telehealth: Payer: Self-pay | Admitting: Family Medicine

## 2016-05-04 NOTE — Telephone Encounter (Signed)
Stanton KidneyDebra needs order to restart pt under medicaid for medication management. Fax [phone]

## 2016-05-04 NOTE — Telephone Encounter (Signed)
Please give this order  

## 2016-05-05 NOTE — Telephone Encounter (Signed)
I left a voice message for Conan BowensDebra Carr with verbal order for below request.

## 2016-05-10 ENCOUNTER — Ambulatory Visit (INDEPENDENT_AMBULATORY_CARE_PROVIDER_SITE_OTHER): Payer: Medicare Other | Admitting: Endocrinology

## 2016-05-10 ENCOUNTER — Encounter: Payer: Medicare Other | Attending: Endocrinology | Admitting: Nutrition

## 2016-05-10 ENCOUNTER — Ambulatory Visit (INDEPENDENT_AMBULATORY_CARE_PROVIDER_SITE_OTHER): Payer: Medicare Other | Admitting: Family Medicine

## 2016-05-10 ENCOUNTER — Encounter: Payer: Self-pay | Admitting: Endocrinology

## 2016-05-10 ENCOUNTER — Encounter: Payer: Self-pay | Admitting: Family Medicine

## 2016-05-10 VITALS — BP 130/80 | HR 86 | Temp 98.5°F | Ht 64.5 in | Wt 141.2 lb

## 2016-05-10 VITALS — BP 122/88 | HR 81 | Temp 97.7°F | Ht 64.5 in | Wt 141.0 lb

## 2016-05-10 DIAGNOSIS — F4310 Post-traumatic stress disorder, unspecified: Secondary | ICD-10-CM

## 2016-05-10 DIAGNOSIS — I10 Essential (primary) hypertension: Secondary | ICD-10-CM | POA: Diagnosis not present

## 2016-05-10 DIAGNOSIS — Z713 Dietary counseling and surveillance: Secondary | ICD-10-CM | POA: Diagnosis not present

## 2016-05-10 DIAGNOSIS — E1042 Type 1 diabetes mellitus with diabetic polyneuropathy: Secondary | ICD-10-CM

## 2016-05-10 DIAGNOSIS — F329 Major depressive disorder, single episode, unspecified: Secondary | ICD-10-CM

## 2016-05-10 DIAGNOSIS — F32A Depression, unspecified: Secondary | ICD-10-CM

## 2016-05-10 LAB — BASIC METABOLIC PANEL
BUN: 15 mg/dL (ref 6–23)
CO2: 27 meq/L (ref 19–32)
Calcium: 9.4 mg/dL (ref 8.4–10.5)
Chloride: 98 mEq/L (ref 96–112)
Creatinine, Ser: 0.53 mg/dL (ref 0.40–1.20)
GFR: 125.91 mL/min (ref >60.00)
GLUCOSE: 386 mg/dL — AB (ref 70–99)
POTASSIUM: 3.7 meq/L (ref 3.5–5.1)
SODIUM: 134 meq/L — AB (ref 135–145)

## 2016-05-10 LAB — LIPID PANEL
CHOL/HDL RATIO: 2
CHOLESTEROL: 135 mg/dL (ref 0–200)
HDL: 65.3 mg/dL (ref >39.00)
LDL CALC: 55 mg/dL (ref 0–99)
NonHDL: 69.92
TRIGLYCERIDES: 74 mg/dL (ref 0.0–149.0)
VLDL: 14.8 mg/dL (ref 0.0–40.0)

## 2016-05-10 LAB — TSH: TSH: 4.51 u[IU]/mL — AB (ref 0.35–4.50)

## 2016-05-10 LAB — CORTISOL: Cortisol, Plasma: 12.2 ug/dL

## 2016-05-10 MED ORDER — LAMOTRIGINE 100 MG PO TABS: 100.0000 mg | ORAL_TABLET | Freq: Every day | ORAL | Status: DC

## 2016-05-10 NOTE — Progress Notes (Signed)
Pre visit review using our clinic review tool, if applicable. No additional management support is needed unless otherwise documented below in the visit note. 

## 2016-05-10 NOTE — Progress Notes (Signed)
Patient is here with her daughter.  She is complaining that she is "always hungry.   Typical day: 8AM: oatmeal with cinnamon,  10AM:  Snacking  On "anything I can find".  Crackers with peanut butter and milk, or cookies and milk, 1-2 fruits 12PM: lunch at food bank:  Could be 2 hot dogs with baked beans, or spagetti with meat sauce, desert, or sandwich with chips 3PM: snacking again because am hungry-as above 6PM: supper:  3-4 ounces meat, with 1 starchy veg and bread or veg. Plate, with bread. 8PM: snacking again.    SBGM:  Testing q AM:  377 today (due to late night snacking of peanut butter and a whole bag of popcorn), 261, 139, 102, 65, 97, 272.    Suggestions given: 1. Add protein to breakfast and all meals.  Gave samples of protein choices she can use if breakfast or lunches have none.  She reported good understanding of this. Told her that this will keep her blood sugars from dropping low and keep her feeling full longer.  She agreed to do this. 2.  Limit HS snacking to no more than 200 calories.  Gave list of appropriate HS snacking of 15-20 grams of carb, and one ounce of protein. 3.  Discussed the possible need to take insulin twice daily.  Suggested times for this, and the need to always eat lunch.  She is not happy about having to take 2 shots, but told her that it may help with her hunger.

## 2016-05-10 NOTE — Patient Instructions (Signed)
Add protein from list given to breakfast meals of oatmeal.  Limit bedtime snacking to no more than 200 total calories.

## 2016-05-10 NOTE — Patient Instructions (Addendum)
check your blood sugar 4 times a day: before the 3 meals, and at bedtime.  also check if you have symptoms of your blood sugar being too high or too low.  please keep a record of the readings and bring it to your next appointment here.  You can write it on any piece of paper.  please call us sooner if your blood sugar goes below 70, or if you have a lot of readings over 200.   blood tests are requested for you today.  We'll let you know about the results. Based on the results, we may need to change to a faster-acting insulin, called "NPH" On this type of insulin schedule, you should eat meals on a regular schedule.  If a meal is missed or significantly delayed, your blood sugar could go low.  Please come back for a follow-up appointment in 2-3 weeks.  Please see Isabel [name] the same day.

## 2016-05-10 NOTE — Progress Notes (Signed)
Subjective:    Patient ID: Isabel [name], female    DOB: [birth year], 58 y.o.   MRN: [id number]  HPI Pt returns for f/u of diabetes mellitus: DM type: 1 Dx'ed: 2011, when she presented with gallstone pancreatitis.   Complications: polyneuropathy.   Therapy: insulin since dx.   GDM: never. DKA: twice (2014 and 2016).  Severe hypoglycemia: once (early 2017, during a GI illness) Pancreatitis: only once, at dx.  Other: she was changed to BID premixed insulin, then QD, after poor results with multiple daily injections.  She declines pump and continuous glucose monitor.  Interval history:  no cbg record, but states cbg's vary from 58-200's.  It is in general higher as the day goes on.  She says she never misses the insulin.  pt states she feels well in general. Past Medical History  Diagnosis Date  . Allergy   . Asthma   . Depression   . GERD (gastroesophageal reflux disease)   . Hypertension   . Cystitis, interstitial   . Pneumonia   . Diabetes mellitus without complication ([place])   . Pancreatic pseudocyst   . Sleep apnea     wears a CPAP at night     Past Surgical History  Procedure Laterality Date  . Cystgastrostomy  09-02-12    per Dr. Radene Knee at [place]  . Marital Status: Single    Spouse Name: N/A  . Number of Children: N/A  . Years of Education: N/A   Occupational History  . Not on file.   Social History Main Topics  . Smoking status: Never Smoker   . Smokeless tobacco: Never Used  . Alcohol Use: No  . Drug Use: No  . Sexual Activity: No   Other Topics Concern  . Not on file   Social History Narrative    Current Outpatient Prescriptions on File Prior to Visit  Medication Sig Dispense Refill  . acetaminophen (TYLENOL) 325 MG tablet Take 2 tablets (650 mg total) by mouth every 6 (six) hours as needed for headache.    . albuterol (PROVENTIL HFA;VENTOLIN HFA) 108 (90 BASE) MCG/ACT inhaler Inhale 2  puffs into the lungs every 6 (six) hours as needed for wheezing or shortness of breath. 1 Inhaler 11  . albuterol (PROVENTIL) (2.5 MG/3ML) 0.083% nebulizer solution Take 3 mLs (2.5 mg total) by nebulization every 6 (six) hours as needed for wheezing or shortness of breath. 75 mL 11  . aspirin EC 81 MG tablet Take 1 tablet (81 mg total) by mouth daily. 1 tablet 0  . Blood Glucose Monitoring Suppl (ACCU-CHEK AVIVA PLUS) w/Device KIT Use to check blood sugar 4 times per day. Dx code. E11. 1 kit 2  . budesonide-formoterol (SYMBICORT) 160-4.5 MCG/ACT inhaler Inhale 2 puffs into the lungs 2 (two) times daily. For asthma 1 Inhaler 11  . cetirizine (ZYRTEC) 10 MG tablet Take 1 tablet (10 mg total) by mouth daily. For allergies 90 tablet 3  . desvenlafaxine (PRISTIQ) 100 MG 24 hr tablet Take 1 tablet (100 mg total) by mouth daily. 30 tablet 11  . desvenlafaxine (PRISTIQ) 50 MG 24 hr tablet Take 1 tablet (50 mg total) by mouth daily. 30 tablet 11  . diazepam (VALIUM) 5 MG tablet Take 1 tablet (5 mg total) by mouth every 8 (eight) hours as needed for anxiety. 90 tablet 5  . glucose blood (ACCU-CHEK AVIVA PLUS) test strip Use to check  blood sugar 4 times per day. Dx Code: E11.9 150 each 2  . imipramine (TOFRANIL) 50 MG tablet Take 1 tablet (50 mg total) by mouth at bedtime. 30 tablet 11  . Insulin Pen Needle (RELION SHORT PEN NEEDLES) 31G X 8 MM MISC USE AS DIRECTED 200 each 2  . Lancets (ACCU-CHEK MULTICLIX) lancets Use to check blood sugar 4 times per day. Dx Code: E11.9 100 each 12  . mometasone (NASONEX) 50 MCG/ACT nasal spray INSTILL 2 SPRAYS IN EACH NOSTRIL DAILY 51 g 1  . montelukast (SINGULAIR) 10 MG tablet Take 1 tablet (10 mg total) by mouth at bedtime. For asthma 30 tablet 11  . NEXIUM 40 MG capsule Take 1 capsule (40 mg total) by mouth daily at 12 noon. 30 capsule 11  . nystatin cream (MYCOSTATIN) Apply 1 application topically 2 (two) times daily as needed for dry skin (rash). 30 g 0  . oxybutynin  (DITROPAN-XL) 10 MG 24 hr tablet Take 1 tablet (10 mg total) by mouth daily. For bladder spasm 30 tablet 11  . rOPINIRole (REQUIP) 1 MG tablet Take 1 tablet (1 mg total) by mouth at bedtime. For restless leg syndrome 30 tablet 11  . Fluticasone Furoate-Vilanterol (BREO ELLIPTA IN) Inhale into the lungs.    . lamoTRIgine (LAMICTAL) 100 MG tablet Take 1 tablet (100 mg total) by mouth daily. 30 tablet 5   No current facility-administered medications on file prior to visit.    Allergies  Allergen Reactions  . Aspirin     REACTION: unspecified  . Latex     rash  . Metformin And Related   . Penicillins     REACTION: unspecified  . Povidone-Iodine     REACTION: unspecified  . Sulfamethoxazole     REACTION: unspecified    Family History  Problem Relation Age of Onset  . Cancer      other    BP 122/88 mmHg  Pulse 81  Temp(Src) 97.7 F (36.5 C)  Ht 5' 4.5" (1.638 m)  Wt 141 lb (63.957 kg)  BMI 23.84 kg/m2  SpO2 96%  Review of Systems Denies LOC    Objective:   Physical Exam VITAL SIGNS:  See vs page GENERAL: no distress Pulses: dorsalis pedis intact bilat.   MSK: no deformity of the feet CV: no leg edema Skin:  no ulcer on the feet.  normal color and temp on the feet. Neuro: sensation is intact to touch on the feet, but decreased from normal.   Ext: There is bilateral onychomycosis of the toenails.    Fructosamine=437  K+=5.8 in 2016    Assessment & Plan:  Hyperkalemia, new to me.  Recheck today. Type 1 DM: she needs a faster-acting QD insulin: change to novolog 70/30, as ins does not pay for NPH pen.    Patient is advised the following: Patient Instructions  check your blood sugar 4 times a day: before the 3 meals, and at bedtime.  also check if you have symptoms of your blood sugar being too high or too low.  please keep a record of the readings and bring it to your next appointment here.  You can write it on any piece of paper.  please call us sooner if your  blood sugar goes below 70, or if you have a lot of readings over 200.   blood tests are requested for you today.  We'll let you know about the results. Based on the results, we may need to change to a  faster-acting insulin, called "NPH" On this type of insulin schedule, you should eat meals on a regular schedule.  If a meal is missed or significantly delayed, your blood sugar could go low.  Please come back for a follow-up appointment in 2-3 weeks.  Please see Vaughan Basta the same day.   Renato Shin, MD

## 2016-05-10 NOTE — Progress Notes (Signed)
   Subjective:    Patient ID: Isabel [name], female    DOB: 06-Mar-1956, 60 y.o.   MRN: [id number]  HPI Here for a follow up visit. Her diabetes has been stable and she saw Dr. Everardo AllEllison this morning. Her BP is stable. At out last visit we increased Pristiq to a total of 150 mg a day and we increased Valium to 5 mg TID. This has helped her daily depression but she still gets irritable quickly and has temper tantrums. She sleeps well. Appetite is good.    Review of Systems  Constitutional: Negative.   Respiratory: Negative.   Cardiovascular: Negative.   Neurological: Negative.   Psychiatric/Behavioral: Positive for behavioral problems, dysphoric mood and agitation. Negative for hallucinations, confusion and decreased concentration. The patient is nervous/anxious.        Objective:   Physical Exam  Constitutional: She is oriented to person, place, and time. She appears well-developed and well-nourished.  Neck: No thyromegaly present.  Cardiovascular: Normal rate, regular rhythm, normal heart sounds and intact distal pulses.   Pulmonary/Chest: Effort normal and breath sounds normal.  Lymphadenopathy:    She has no cervical adenopathy.  Neurological: She is alert and oriented to person, place, and time.  Psychiatric: She has a normal mood and affect. Her behavior is normal. Thought content normal.          Assessment & Plan:  Her HTN is stable. For the depression we will add Lamictal 100 mg daily at bedtime. Hopefull this will help with labile moods and irritability. They will give us a report in 2 weeks.  Nelwyn SalisburyFRY,STEPHEN A, MD

## 2016-05-11 ENCOUNTER — Telehealth: Payer: Self-pay | Admitting: Endocrinology

## 2016-05-11 LAB — FRUCTOSAMINE: Fructosamine: 437 umol/L — ABNORMAL HIGH (ref 0–285)

## 2016-05-11 MED ORDER — INSULIN ASPART PROT & ASPART (70-30 MIX) 100 UNIT/ML PEN: 40.0000 [IU] | PEN_INJECTOR | Freq: Two times a day (BID) | SUBCUTANEOUS | Status: DC

## 2016-05-11 NOTE — Telephone Encounter (Signed)
I contacted the pt's caretaker and advised we had not gotten all of the results back from her 05/10/2016 lab results. Caretaker was advised thyroid labs were slightly abnormal, but no medication was needed at this time. She was advised the blood sugar test would be released to my chart once Dr. Everardo AllEllison reviews. Pt's caretaker voiced understanding.

## 2016-05-11 NOTE — Telephone Encounter (Signed)
Patient is calling about labs results

## 2016-05-11 NOTE — Telephone Encounter (Signed)
i sent results via mychart

## 2016-05-12 ENCOUNTER — Encounter: Payer: Self-pay | Admitting: Endocrinology

## 2016-05-14 ENCOUNTER — Encounter: Payer: Self-pay | Admitting: Family Medicine

## 2016-05-15 ENCOUNTER — Telehealth: Payer: Self-pay

## 2016-05-15 ENCOUNTER — Emergency Department (HOSPITAL_COMMUNITY)
Admission: EM | Admit: 2016-05-15 | Discharge: 2016-05-15 | Disposition: A | Discharge status: Home / Self Care | Payer: Medicare Other | Attending: Emergency Medicine | Admitting: Emergency Medicine

## 2016-05-15 ENCOUNTER — Encounter (HOSPITAL_COMMUNITY): Payer: Self-pay

## 2016-05-15 DIAGNOSIS — I10 Essential (primary) hypertension: Secondary | ICD-10-CM | POA: Insufficient documentation

## 2016-05-15 DIAGNOSIS — Z7982 Long term (current) use of aspirin: Secondary | ICD-10-CM | POA: Insufficient documentation

## 2016-05-15 DIAGNOSIS — Z91040 Latex allergy status: Secondary | ICD-10-CM | POA: Diagnosis not present

## 2016-05-15 DIAGNOSIS — Z79899 Other long term (current) drug therapy: Secondary | ICD-10-CM | POA: Diagnosis not present

## 2016-05-15 DIAGNOSIS — J45909 Unspecified asthma, uncomplicated: Secondary | ICD-10-CM | POA: Insufficient documentation

## 2016-05-15 DIAGNOSIS — E1165 Type 2 diabetes mellitus with hyperglycemia: Secondary | ICD-10-CM | POA: Diagnosis not present

## 2016-05-15 DIAGNOSIS — R739 Hyperglycemia, unspecified: Secondary | ICD-10-CM

## 2016-05-15 DIAGNOSIS — Z794 Long term (current) use of insulin: Secondary | ICD-10-CM | POA: Insufficient documentation

## 2016-05-15 DIAGNOSIS — E162 Hypoglycemia, unspecified: Secondary | ICD-10-CM | POA: Diagnosis not present

## 2016-05-15 LAB — URINALYSIS, ROUTINE W REFLEX MICROSCOPIC
Bilirubin Urine: NEGATIVE
Glucose, UA: >1000 mg/dL — AB
HGB URINE DIPSTICK: NEGATIVE
Ketones, ur: NEGATIVE mg/dL
Leukocytes, UA: NEGATIVE
Nitrite: NEGATIVE
Protein, ur: NEGATIVE mg/dL
SPECIFIC GRAVITY, URINE: 1.037 — AB (ref 1.005–1.030)
pH: 5 (ref 5.0–8.0)

## 2016-05-15 LAB — BASIC METABOLIC PANEL
ANION GAP: 7 (ref 5–15)
BUN: 25 mg/dL — ABNORMAL HIGH (ref 6–20)
CALCIUM: 9.9 mg/dL (ref 8.9–10.3)
CHLORIDE: 98 mmol/L — AB (ref 101–111)
CO2: 29 mmol/L (ref 22–32)
Creatinine, Ser: 0.58 mg/dL (ref 0.44–1.00)
GFR calc non Af Amer: >60 mL/min (ref >60)
Glucose, Bld: 512 mg/dL — ABNORMAL HIGH (ref 65–99)
Potassium: 4.3 mmol/L (ref 3.5–5.1)
Sodium: 134 mmol/L — ABNORMAL LOW (ref 135–145)

## 2016-05-15 LAB — CBC
HCT: 40.5 % (ref 36.0–46.0)
HEMOGLOBIN: 13.5 g/dL (ref 12.0–15.0)
MCH: 27.1 pg (ref 26.0–34.0)
MCHC: 33.3 g/dL (ref 30.0–36.0)
MCV: 81.3 fL (ref 78.0–100.0)
Platelets: 201 10*3/uL (ref 150–400)
RBC: 4.98 MIL/uL (ref 3.87–5.11)
RDW: 13.6 % (ref 11.5–15.5)
WBC: 6.1 10*3/uL (ref 4.0–10.5)

## 2016-05-15 LAB — URINE MICROSCOPIC-ADD ON
BACTERIA UA: NONE SEEN
RBC / HPF: NONE SEEN RBC/hpf (ref 0–5)
WBC UA: NONE SEEN WBC/hpf (ref 0–5)

## 2016-05-15 LAB — CBG MONITORING, ED
GLUCOSE-CAPILLARY: 494 mg/dL — AB (ref 65–99)
Glucose-Capillary: 310 mg/dL — ABNORMAL HIGH (ref 65–99)
Glucose-Capillary: 112 mg/dL — ABNORMAL HIGH (ref 65–99)

## 2016-05-15 MED ORDER — SODIUM CHLORIDE 0.9 % IV BOLUS (SEPSIS)
1000.0000 mL | Freq: Once | INTRAVENOUS | Status: AC
  Administered 2016-05-15: 1000 mL via INTRAVENOUS

## 2016-05-15 MED ORDER — INSULIN ASPART 100 UNIT/ML ~~LOC~~ SOLN
15.0000 [IU] | Freq: Once | SUBCUTANEOUS | Status: AC
  Administered 2016-05-15: 15 [IU] via SUBCUTANEOUS

## 2016-05-15 NOTE — ED Provider Notes (Signed)
CSN: [id number]     Arrival date & time 05/15/16  1234 History   First MD Initiated Contact with Patient 05/15/16 1308     Chief Complaint  Patient presents with  . Hyperglycemia     (Consider location/radiation/quality/duration/timing/severity/associated sxs/prior Treatment) Patient is a 60 y.o. female presenting with hyperglycemia. The history is provided by a relative (Patient's daughter states that the patient's blood sugar has been running high lately. Her insulin was changed 5 days ago).  Hyperglycemia Severity:  Moderate Onset quality:  Gradual Timing:  Constant Progression:  Unchanged Chronicity:  New Diabetes status:  Controlled with insulin Associated symptoms: no abdominal pain, no chest pain and no fatigue     Past Medical History  Diagnosis Date  . Allergy   . Asthma   . Depression   . GERD (gastroesophageal reflux disease)   . Hypertension   . Cystitis, interstitial   . Pneumonia   . Diabetes mellitus without complication ([place])   . Pancreatic pseudocyst   . Sleep apnea     wears a CPAP at night    Past Surgical History  Procedure Laterality Date  . Cystgastrostomy  09-02-12    per Dr. Radene Knee at [facility]   . Cholecystectomy     Family History  Problem Relation Age of Onset  . Cancer      other   Social History  Substance Use Topics  . Smoking status: Never Smoker   . Smokeless tobacco: Never Used  . Alcohol Use: No   OB History    No data available     Review of Systems  Constitutional: Negative for appetite change and fatigue.  HENT: Negative for congestion, ear discharge and sinus pressure.   Eyes: Negative for discharge.  Respiratory: Negative for cough.   Cardiovascular: Negative for chest pain.  Gastrointestinal: Negative for abdominal pain and diarrhea.  Genitourinary: Negative for frequency and hematuria.  Musculoskeletal: Negative for back pain.  Skin: Negative for rash.  Neurological: Negative for seizures and headaches.   Psychiatric/Behavioral: Negative for hallucinations.      Allergies  Aspirin; Latex; Penicillins; Sulfamethoxazole; Metformin and related; Iodine; and Povidone-iodine  Home Medications   Prior to Admission medications   Medication Sig Start Date End Date Taking? Authorizing Provider  acetaminophen (TYLENOL) 325 MG tablet Take 2 tablets (650 mg total) by mouth every 6 (six) hours as needed for headache. 05/03/14  Yes Encarnacion Slates, NP  albuterol (PROVENTIL HFA;VENTOLIN HFA) 108 (90 BASE) MCG/ACT inhaler Inhale 2 puffs into the lungs every 6 (six) hours as needed for wheezing or shortness of breath. 07/09/15  Yes Laurey Morale, MD  albuterol (PROVENTIL) (2.5 MG/3ML) 0.083% nebulizer solution Take 3 mLs (2.5 mg total) by nebulization every 6 (six) hours as needed for wheezing or shortness of breath. Patient taking differently: Take 2.5 mg by nebulization every 4 (four) hours. Every 4-6 hours 07/09/15  Yes Laurey Morale, MD  aspirin EC 81 MG tablet Take 1 tablet (81 mg total) by mouth daily. 11/10/15  Yes Laurey Morale, MD  Blood Glucose Monitoring Suppl (ACCU-CHEK AVIVA PLUS) w/Device KIT Use to check blood sugar 4 times per day. Dx code. E11. 03/29/16  Yes Renato Shin, MD  budesonide-formoterol (SYMBICORT) 160-4.5 MCG/ACT inhaler Inhale 2 puffs into the lungs 2 (two) times daily. For asthma 07/09/15  Yes Laurey Morale, MD  cetirizine (ZYRTEC) 10 MG tablet Take 1 tablet (10 mg total) by mouth daily. For allergies 05/03/14  Yes Encarnacion Slates, NP  COMBIVENT RESPIMAT 20-100 MCG/ACT AERS respimat Inhale 2 puffs into the lungs 2 (two) times daily. 04/05/16  Yes Historical Provider, MD  desvenlafaxine (PRISTIQ) 100 MG 24 hr tablet Take 1 tablet (100 mg total) by mouth daily. 07/09/15  Yes Laurey Morale, MD  desvenlafaxine (PRISTIQ) 50 MG 24 hr tablet Take 1 tablet (50 mg total) by mouth daily. 02/09/16  Yes Laurey Morale, MD  diazepam (VALIUM) 5 MG tablet Take 1 tablet (5 mg total) by mouth every 8 (eight) hours as  needed for anxiety. Patient taking differently: Take 5 mg by mouth 3 (three) times daily.  02/09/16  Yes Laurey Morale, MD  Fluticasone Furoate-Vilanterol (BREO ELLIPTA IN) Inhale 2 puffs into the lungs 2 (two) times daily.    Yes Historical Provider, MD  glucose blood (ACCU-CHEK AVIVA PLUS) test strip Use to check blood sugar 4 times per day. Dx Code: E11.9 03/29/16  Yes Renato Shin, MD  imipramine (TOFRANIL) 50 MG tablet Take 1 tablet (50 mg total) by mouth at bedtime. 07/09/15  Yes Laurey Morale, MD  insulin aspart protamine - aspart (NOVOLOG MIX 70/30 FLEXPEN) (70-30) 100 UNIT/ML FlexPen Inject 0.4 mLs (40 Units total) into the skin 2 (two) times daily. And pen needles 1/day 05/11/16  Yes Renato Shin, MD  Insulin Pen Needle (RELION SHORT PEN NEEDLES) 31G X 8 MM MISC USE AS DIRECTED 02/05/16  Yes Renato Shin, MD  lamoTRIgine (LAMICTAL) 100 MG tablet Take 1 tablet (100 mg total) by mouth daily. Patient taking differently: Take 100 mg by mouth at bedtime.  05/10/16  Yes Laurey Morale, MD  Lancets (ACCU-CHEK MULTICLIX) lancets Use to check blood sugar 4 times per day. Dx Code: E11.9 03/29/16  Yes Renato Shin, MD  mometasone (NASONEX) 50 MCG/ACT nasal spray INSTILL 2 SPRAYS IN EACH NOSTRIL DAILY 06/30/15  Yes Laurey Morale, MD  montelukast (SINGULAIR) 10 MG tablet Take 1 tablet (10 mg total) by mouth at bedtime. For asthma 07/09/15  Yes Laurey Morale, MD  NEXIUM 40 MG capsule Take 1 capsule (40 mg total) by mouth daily at 12 noon. 07/11/15  Yes Laurey Morale, MD  nystatin cream (MYCOSTATIN) Apply 1 application topically 2 (two) times daily as needed for dry skin (rash). 05/03/14  Yes Encarnacion Slates, NP  oxybutynin (DITROPAN-XL) 10 MG 24 hr tablet Take 1 tablet (10 mg total) by mouth daily. For bladder spasm 07/09/15  Yes Laurey Morale, MD  rOPINIRole (REQUIP) 1 MG tablet Take 1 tablet (1 mg total) by mouth at bedtime. For restless leg syndrome 07/09/15  Yes Laurey Morale, MD  LEVEMIR FLEXTOUCH 100 UNIT/ML Pen Inject 45  Units into the skin daily with breakfast. Reported on 05/15/2016 05/11/16   Historical Provider, MD   BP 120/85 mmHg  Pulse 79  Temp(Src) 98.2 F (36.8 C) (Oral)  Resp 24  Ht _0  (1.676 m)  Wt 141 lb (63.957 kg)  BMI 22.77 kg/m2  SpO2 99% Physical Exam  Constitutional: She is oriented to person, place, and time. She appears well-developed.  HENT:  Head: Normocephalic.  Eyes: Conjunctivae and EOM are normal. No scleral icterus.  Neck: Neck supple. No thyromegaly present.  Cardiovascular: Normal rate and regular rhythm.  Exam reveals no gallop and no friction rub.   No murmur heard. Pulmonary/Chest: No stridor. She has no wheezes. She has no rales. She exhibits no tenderness.  Abdominal: She exhibits no distension. There is no tenderness. There is no rebound.  Musculoskeletal:  Normal range of motion. She exhibits no edema.  Lymphadenopathy:    She has no cervical adenopathy.  Neurological: She is oriented to person, place, and time. She exhibits normal muscle tone. Coordination normal.  Skin: No rash noted. No erythema.  Psychiatric: She has a normal mood and affect. Her behavior is normal.    ED Course  Procedures (including critical care time) Labs Review Labs Reviewed  BASIC METABOLIC PANEL - Abnormal; Notable for the following:    Sodium 134 (*)    Chloride 98 (*)    Glucose, Bld 512 (*)    BUN 25 (*)    All other components within normal limits  URINALYSIS, ROUTINE W REFLEX MICROSCOPIC (NOT AT [facility]) - Abnormal; Notable for the following:    Specific Gravity, Urine 1.037 (*)    Glucose, UA >1000 (*)    All other components within normal limits  URINE MICROSCOPIC-ADD ON - Abnormal; Notable for the following:    Squamous Epithelial / LPF 0-5 (*)    All other components within normal limits  CBG MONITORING, ED - Abnormal; Notable for the following:    Glucose-Capillary 494 (*)    All other components within normal limits  CBG MONITORING, ED - Abnormal; Notable for the  following:    Glucose-Capillary 310 (*)    All other components within normal limits  CBG MONITORING, ED - Abnormal; Notable for the following:    Glucose-Capillary 112 (*)    All other components within normal limits  CBC    Imaging Review No results found. I have personally reviewed and evaluated these images and lab results as part of my medical decision-making.   EKG Interpretation None      MDM   Final diagnoses:  Hyperglycemia    Patient with poorly controlled diabetes. Patient was given fluids and insulin and her sugar came down nicely. We will increase her insulin dose approximately 10% and she will follow-up with her PCP this week    Milton Ferguson, MD 05/15/16 1727

## 2016-05-15 NOTE — ED Notes (Signed)
According to EMS, pt had a 495 BG at home called her PCP and was instructed to come here. EMS reports BG of 597. Pt arrives A+OX4, speaking in complete sentences, and ambulatory.

## 2016-05-15 NOTE — ED Notes (Signed)
Pt and family member state unable to get ahold of friend/family to pick them up related to graduations this weekend. Pt offered bus pass but declined [place]. Darl PikesSusan ED CRN and Isac CaddyShearer AC states unable to assist further; pt and family member may sit in lobby until able to get a ride. Pt and family member given sandwich, crackers, cheese, and water.

## 2016-05-15 NOTE — Telephone Encounter (Signed)
Patient cousin (caretaker) called [facility] and stated that pt is having sx of shakiness and over all not feeling well. Pt is oriented at this time and BS this am was over 500. Informed [facility] RN that pt needed to go to ED. RN Selena Batten(Kim) stated that she informed pt cousin of the same but was worried that EMS would not let her ride and there was no other way to get her to the ED.  Reiterated that pt needed to go to ED. RN stated that she would call pt and inform of the same.

## 2016-05-15 NOTE — ED Notes (Signed)
Pt reports polyuria, polydipsia, and polydipsia onset last night. CBG yesterday 170s. CBG today 400s.

## 2016-05-15 NOTE — Discharge Instructions (Signed)
Increase insulin to 45 units twice a day and follow up with your md

## 2016-05-15 NOTE — ED Notes (Signed)
Per [place] recheck CBG @ 1630.

## 2016-05-17 ENCOUNTER — Encounter: Payer: Self-pay | Admitting: Endocrinology

## 2016-05-17 ENCOUNTER — Telehealth: Payer: Self-pay | Admitting: Internal Medicine

## 2016-05-17 NOTE — Telephone Encounter (Signed)
Patient is shaking ,mood swigs, falling down  Possibly due to mood swings change in her insulin, patient stated that she was on to much Novalog and would prefer to be put back on insulin Levemir. Please advise

## 2016-05-17 NOTE — Telephone Encounter (Signed)
Called from [facility] and patient's sister is wanting to switch from novolog to levemir and never heard back from Dr. George HughEllison's office today. Per advice on mychart documentation from Dr. Everardo AllEllison she needs visit. Advice given over the phone is to maintain novolog and call office in the morning. They agree. If having low sugars they know how to treat.

## 2016-05-18 ENCOUNTER — Encounter: Payer: Self-pay | Admitting: Family Medicine

## 2016-05-18 ENCOUNTER — Telehealth: Payer: Self-pay | Admitting: Endocrinology

## 2016-05-18 DIAGNOSIS — G4733 Obstructive sleep apnea (adult) (pediatric): Secondary | ICD-10-CM | POA: Diagnosis not present

## 2016-05-18 MED ORDER — INSULIN DETEMIR 100 UNIT/ML FLEXPEN: 45.0000 [IU] | PEN_INJECTOR | SUBCUTANEOUS | Status: DC

## 2016-05-18 NOTE — Telephone Encounter (Signed)
[facility] Call:  Callers states she is wondering if Dr. Everardo AllEllison has answered back about message earlier.  Cousin was put on novolog but she has been having diarrea ever since and they are worried about dehydration.  She was in ER Saturday until yesterday morning.  She wants to switch back to old insulin - Levemir.  Wasn't having diarrhea in hospital but started back when she went back on Novolog.

## 2016-05-18 NOTE — Telephone Encounter (Signed)
please call patient: Please change back to levemir, 45 units qam.  i have sent a prescription to your pharmacy i'll see you next month

## 2016-05-18 NOTE — Telephone Encounter (Signed)
Please advise ov this week 

## 2016-05-18 NOTE — Telephone Encounter (Signed)
Pt's caretaker was advised of note below. She stated the pt could not come in until July/02/2016.

## 2016-05-18 NOTE — Telephone Encounter (Signed)
I contacted the pt's caretaker and advised of note below she voiced understanding.

## 2016-05-19 DIAGNOSIS — R5383 Other fatigue: Secondary | ICD-10-CM | POA: Diagnosis not present

## 2016-05-19 DIAGNOSIS — J4520 Mild intermittent asthma, uncomplicated: Secondary | ICD-10-CM | POA: Diagnosis not present

## 2016-05-19 DIAGNOSIS — G4733 Obstructive sleep apnea (adult) (pediatric): Secondary | ICD-10-CM | POA: Diagnosis not present

## 2016-05-19 DIAGNOSIS — K219 Gastro-esophageal reflux disease without esophagitis: Secondary | ICD-10-CM | POA: Diagnosis not present

## 2016-05-19 NOTE — Telephone Encounter (Signed)
I am glad to hear the Lamictal has helped. I will forward your note about the insulin to Dr. Everardo AllEllison.

## 2016-05-20 ENCOUNTER — Other Ambulatory Visit: Payer: Self-pay | Admitting: Endocrinology

## 2016-05-20 MED ORDER — LEVEMIR FLEXTOUCH 100 UNIT/ML ~~LOC~~ SOPN: 45.0000 [IU] | PEN_INJECTOR | Freq: Every day | SUBCUTANEOUS | Status: AC

## 2016-05-27 ENCOUNTER — Telehealth: Payer: Self-pay | Admitting: Family Medicine

## 2016-05-27 NOTE — Telephone Encounter (Signed)
Isabel [name] with [facility] states pt has had some additional issues. Her endo Md had changed her insulin which had caused diarrhea, pt had to go to the ed. Pt has some mental issues as well. Arline AspCindy would like a verbal to   increase visits from EOW to one time per week to address these concerns.

## 2016-05-28 NOTE — Telephone Encounter (Signed)
I spoke with Arline AspCindy and gave the verbal order.

## 2016-05-28 NOTE — Telephone Encounter (Signed)
Okay, please increase home nursing visits to weekly

## 2016-06-03 ENCOUNTER — Other Ambulatory Visit: Payer: Self-pay | Admitting: Endocrinology

## 2016-06-07 ENCOUNTER — Ambulatory Visit: Payer: Self-pay | Admitting: Endocrinology

## 2016-06-07 DIAGNOSIS — I10 Essential (primary) hypertension: Secondary | ICD-10-CM | POA: Diagnosis not present

## 2016-06-07 DIAGNOSIS — J449 Chronic obstructive pulmonary disease, unspecified: Secondary | ICD-10-CM | POA: Diagnosis not present

## 2016-06-07 DIAGNOSIS — F331 Major depressive disorder, recurrent, moderate: Secondary | ICD-10-CM | POA: Diagnosis not present

## 2016-06-07 DIAGNOSIS — E119 Type 2 diabetes mellitus without complications: Secondary | ICD-10-CM | POA: Diagnosis not present

## 2016-06-24 ENCOUNTER — Telehealth: Payer: Self-pay | Admitting: Family Medicine

## 2016-06-24 NOTE — Telephone Encounter (Signed)
Pt is having increase depression. Please advice/

## 2016-06-24 NOTE — Telephone Encounter (Signed)
Increase the Pristiq to a total of 200 mg daily. Take two 100 mg tabs a day. Call in #[phone] with 11 rf

## 2016-06-25 MED ORDER — DESVENLAFAXINE SUCCINATE ER 100 MG PO TB24: ORAL_TABLET | ORAL | Status: AC

## 2016-06-25 NOTE — Telephone Encounter (Signed)
Rx sent to pharmacy   

## 2016-07-13 ENCOUNTER — Telehealth: Payer: Self-pay

## 2016-07-13 NOTE — Telephone Encounter (Signed)
Prior Authorization denial received for desvenlafaxine (PRISTIQ) 100 MG 24 hr tablet from [facility]  Denied the request under Medicare Part D because:  You asked for a covered drug at a lower cost level. When you ask for a higher cost drug at a lower cost level (tier) it is called a tiering exception. [employer] follows Medicare rules. The Medicare rule in the Prescription Drug Benefit Manual (Chapter 18, Section 30.2.1) explains when you can get a tiering exception. It says the person prescribing your higher cost drug must explain why you can't take any of the lower cost drugs that [facility] offers for your condition. The list of drugs offered by your [facility] plan and the tier level can be found in your prescription Drug Guide. The information we have doesn't tell us why you can't take the lower cost drugs. Please ask your prescriber to give [employer] this information. You don't qualify for a tiering exception per Medicare rules.

## 2016-08-04 ENCOUNTER — Other Ambulatory Visit: Payer: Self-pay | Admitting: Endocrinology

## 2016-08-04 ENCOUNTER — Other Ambulatory Visit: Payer: Self-pay | Admitting: Family Medicine

## 2016-08-04 NOTE — Telephone Encounter (Signed)
Please refill x 1 Ov is due  

## 2016-08-19 ENCOUNTER — Inpatient Hospital Stay (HOSPITAL_COMMUNITY): Payer: Medicare Other

## 2016-08-19 ENCOUNTER — Telehealth: Payer: Self-pay | Admitting: *Deleted

## 2016-08-19 ENCOUNTER — Inpatient Hospital Stay (HOSPITAL_COMMUNITY)
Admission: AD | Admit: 2016-08-19 | Discharge: 2016-08-27 | DRG: 870 | Disposition: A | Discharge status: Skilled Nursing Facility | Payer: Medicare Other | Source: Other Acute Inpatient Hospital | Attending: Internal Medicine | Admitting: Internal Medicine

## 2016-08-19 ENCOUNTER — Telehealth: Payer: Self-pay

## 2016-08-19 DIAGNOSIS — G4733 Obstructive sleep apnea (adult) (pediatric): Secondary | ICD-10-CM | POA: Diagnosis present

## 2016-08-19 DIAGNOSIS — Z794 Long term (current) use of insulin: Secondary | ICD-10-CM

## 2016-08-19 DIAGNOSIS — E876 Hypokalemia: Secondary | ICD-10-CM | POA: Diagnosis present

## 2016-08-19 DIAGNOSIS — R32 Unspecified urinary incontinence: Secondary | ICD-10-CM | POA: Diagnosis present

## 2016-08-19 DIAGNOSIS — E0810 Diabetes mellitus due to underlying condition with ketoacidosis without coma: Secondary | ICD-10-CM | POA: Diagnosis not present

## 2016-08-19 DIAGNOSIS — K219 Gastro-esophageal reflux disease without esophagitis: Secondary | ICD-10-CM | POA: Diagnosis present

## 2016-08-19 DIAGNOSIS — E0811 Diabetes mellitus due to underlying condition with ketoacidosis with coma: Secondary | ICD-10-CM | POA: Diagnosis not present

## 2016-08-19 DIAGNOSIS — J9621 Acute and chronic respiratory failure with hypoxia: Secondary | ICD-10-CM | POA: Diagnosis present

## 2016-08-19 DIAGNOSIS — F70 Mild intellectual disabilities: Secondary | ICD-10-CM | POA: Diagnosis present

## 2016-08-19 DIAGNOSIS — J9811 Atelectasis: Secondary | ICD-10-CM | POA: Diagnosis not present

## 2016-08-19 DIAGNOSIS — F0390 Unspecified dementia without behavioral disturbance: Secondary | ICD-10-CM | POA: Diagnosis present

## 2016-08-19 DIAGNOSIS — I10 Essential (primary) hypertension: Secondary | ICD-10-CM | POA: Diagnosis present

## 2016-08-19 DIAGNOSIS — J9601 Acute respiratory failure with hypoxia: Secondary | ICD-10-CM | POA: Diagnosis not present

## 2016-08-19 DIAGNOSIS — N3010 Interstitial cystitis (chronic) without hematuria: Secondary | ICD-10-CM | POA: Diagnosis present

## 2016-08-19 DIAGNOSIS — A419 Sepsis, unspecified organism: Principal | ICD-10-CM | POA: Diagnosis present

## 2016-08-19 DIAGNOSIS — D649 Anemia, unspecified: Secondary | ICD-10-CM | POA: Diagnosis present

## 2016-08-19 DIAGNOSIS — G9340 Encephalopathy, unspecified: Secondary | ICD-10-CM | POA: Diagnosis not present

## 2016-08-19 DIAGNOSIS — R402431 Glasgow coma scale score 3-8, in the field [EMT or ambulance]: Secondary | ICD-10-CM | POA: Diagnosis not present

## 2016-08-19 DIAGNOSIS — D696 Thrombocytopenia, unspecified: Secondary | ICD-10-CM | POA: Diagnosis present

## 2016-08-19 DIAGNOSIS — E860 Dehydration: Secondary | ICD-10-CM | POA: Diagnosis present

## 2016-08-19 DIAGNOSIS — E1011 Type 1 diabetes mellitus with ketoacidosis with coma: Secondary | ICD-10-CM | POA: Diagnosis not present

## 2016-08-19 DIAGNOSIS — N179 Acute kidney failure, unspecified: Secondary | ICD-10-CM | POA: Diagnosis not present

## 2016-08-19 DIAGNOSIS — R262 Difficulty in walking, not elsewhere classified: Secondary | ICD-10-CM

## 2016-08-19 DIAGNOSIS — J189 Pneumonia, unspecified organism: Secondary | ICD-10-CM | POA: Diagnosis present

## 2016-08-19 DIAGNOSIS — R6521 Severe sepsis with septic shock: Secondary | ICD-10-CM | POA: Diagnosis not present

## 2016-08-19 DIAGNOSIS — J9600 Acute respiratory failure, unspecified whether with hypoxia or hypercapnia: Secondary | ICD-10-CM

## 2016-08-19 DIAGNOSIS — Z91040 Latex allergy status: Secondary | ICD-10-CM

## 2016-08-19 DIAGNOSIS — J45909 Unspecified asthma, uncomplicated: Secondary | ICD-10-CM | POA: Diagnosis not present

## 2016-08-19 DIAGNOSIS — E1042 Type 1 diabetes mellitus with diabetic polyneuropathy: Secondary | ICD-10-CM | POA: Diagnosis not present

## 2016-08-19 DIAGNOSIS — E1310 Other specified diabetes mellitus with ketoacidosis without coma: Secondary | ICD-10-CM | POA: Diagnosis present

## 2016-08-19 DIAGNOSIS — Z8614 Personal history of Methicillin resistant Staphylococcus aureus infection: Secondary | ICD-10-CM

## 2016-08-19 DIAGNOSIS — F319 Bipolar disorder, unspecified: Secondary | ICD-10-CM | POA: Diagnosis present

## 2016-08-19 DIAGNOSIS — D72829 Elevated white blood cell count, unspecified: Secondary | ICD-10-CM | POA: Diagnosis present

## 2016-08-19 DIAGNOSIS — J440 Chronic obstructive pulmonary disease with acute lower respiratory infection: Secondary | ICD-10-CM | POA: Diagnosis present

## 2016-08-19 DIAGNOSIS — E870 Hyperosmolality and hypernatremia: Secondary | ICD-10-CM | POA: Diagnosis present

## 2016-08-19 DIAGNOSIS — G9341 Metabolic encephalopathy: Secondary | ICD-10-CM | POA: Diagnosis present

## 2016-08-19 DIAGNOSIS — Z7982 Long term (current) use of aspirin: Secondary | ICD-10-CM

## 2016-08-19 DIAGNOSIS — N171 Acute kidney failure with acute cortical necrosis: Secondary | ICD-10-CM | POA: Diagnosis not present

## 2016-08-19 DIAGNOSIS — J9690 Respiratory failure, unspecified, unspecified whether with hypoxia or hypercapnia: Secondary | ICD-10-CM | POA: Diagnosis not present

## 2016-08-19 DIAGNOSIS — W19XXXA Unspecified fall, initial encounter: Secondary | ICD-10-CM | POA: Diagnosis not present

## 2016-08-19 DIAGNOSIS — R579 Shock, unspecified: Secondary | ICD-10-CM | POA: Diagnosis not present

## 2016-08-19 DIAGNOSIS — Z4682 Encounter for fitting and adjustment of non-vascular catheter: Secondary | ICD-10-CM | POA: Diagnosis not present

## 2016-08-19 DIAGNOSIS — R4182 Altered mental status, unspecified: Secondary | ICD-10-CM | POA: Diagnosis not present

## 2016-08-19 DIAGNOSIS — Z79899 Other long term (current) drug therapy: Secondary | ICD-10-CM

## 2016-08-19 HISTORY — DX: Pyothorax without fistula: J86.9

## 2016-08-19 HISTORY — DX: Chronic obstructive pulmonary disease, unspecified: J44.9

## 2016-08-19 HISTORY — DX: Bipolar disorder, unspecified: F31.9

## 2016-08-19 HISTORY — DX: Post-traumatic stress disorder, unspecified: F43.10

## 2016-08-19 LAB — BLOOD GAS, ARTERIAL
ACID-BASE DEFICIT: 23.1 mmol/L — AB (ref 0.0–2.0)
BICARBONATE: 6.6 mmol/L — AB (ref 20.0–28.0)
Drawn by: 235321
FIO2: 30
LHR: 32 {breaths}/min
MECHVT: 500 mL
O2 Saturation: 97.9 %
PEEP/CPAP: 5 cmH2O
PH ART: 7.091 — AB (ref 7.350–7.450)
Patient temperature: 94.5
pCO2 arterial: 21.7 mmHg — ABNORMAL LOW (ref 32.0–48.0)
pO2, Arterial: 117 mmHg — ABNORMAL HIGH (ref 83.0–108.0)

## 2016-08-19 LAB — GLUCOSE, CAPILLARY: GLUCOSE-CAPILLARY: 558 mg/dL — AB (ref 65–99)

## 2016-08-19 MED ORDER — SODIUM CHLORIDE 0.9 % IV SOLN: INTRAVENOUS | Status: DC | PRN

## 2016-08-19 MED ORDER — SODIUM CHLORIDE 0.9 % IV SOLN: 250.0000 mL | INTRAVENOUS | Status: DC | PRN

## 2016-08-19 MED ORDER — NOREPINEPHRINE BITARTRATE 1 MG/ML IV SOLN
0.0000 ug/min | INTRAVENOUS | Status: DC
  Administered 2016-08-20: 13 ug/min via INTRAVENOUS
  Filled 2016-08-19: qty 4

## 2016-08-19 MED ORDER — LEVOFLOXACIN IN D5W 750 MG/150ML IV SOLN
750.0000 mg | Freq: Once | INTRAVENOUS | Status: AC
  Administered 2016-08-19: 750 mg via INTRAVENOUS
  Filled 2016-08-19: qty 150

## 2016-08-19 MED ORDER — SODIUM CHLORIDE 0.9 % IV SOLN
25.0000 ug/h | INTRAVENOUS | Status: DC
  Administered 2016-08-21: 50 ug/h via INTRAVENOUS
  Filled 2016-08-19: qty 50

## 2016-08-19 MED ORDER — HEPARIN SODIUM (PORCINE) 5000 UNIT/ML IJ SOLN
5000.0000 [IU] | Freq: Three times a day (TID) | INTRAMUSCULAR | Status: DC
  Administered 2016-08-20 – 2016-08-27 (×23): 5000 [IU] via SUBCUTANEOUS
  Filled 2016-08-19 (×23): qty 1

## 2016-08-19 MED ORDER — IPRATROPIUM-ALBUTEROL 0.5-2.5 (3) MG/3ML IN SOLN
3.0000 mL | Freq: Four times a day (QID) | RESPIRATORY_TRACT | Status: DC
  Administered 2016-08-20 – 2016-08-25 (×24): 3 mL via RESPIRATORY_TRACT
  Filled 2016-08-19 (×24): qty 3

## 2016-08-19 MED ORDER — ALBUTEROL SULFATE (2.5 MG/3ML) 0.083% IN NEBU: 2.5000 mg | INHALATION_SOLUTION | RESPIRATORY_TRACT | Status: DC | PRN

## 2016-08-19 MED ORDER — FAMOTIDINE IN NACL 20-0.9 MG/50ML-% IV SOLN
20.0000 mg | Freq: Two times a day (BID) | INTRAVENOUS | Status: DC
  Administered 2016-08-20 – 2016-08-27 (×16): 20 mg via INTRAVENOUS
  Filled 2016-08-19 (×16): qty 50

## 2016-08-19 MED ORDER — BUDESONIDE 0.5 MG/2ML IN SUSP
0.5000 mg | Freq: Two times a day (BID) | RESPIRATORY_TRACT | Status: DC
  Administered 2016-08-20 – 2016-08-27 (×16): 0.5 mg via RESPIRATORY_TRACT
  Filled 2016-08-19 (×16): qty 2

## 2016-08-19 MED ORDER — DEXTROSE-NACL 5-0.45 % IV SOLN: INTRAVENOUS | Status: DC

## 2016-08-19 MED ORDER — SODIUM CHLORIDE 0.9 % IV SOLN
INTRAVENOUS | Status: DC
  Administered 2016-08-20: via INTRAVENOUS

## 2016-08-19 MED ORDER — SODIUM CHLORIDE 0.9 % IV SOLN
INTRAVENOUS | Status: DC
  Administered 2016-08-20: 8.7 [IU]/h via INTRAVENOUS
  Filled 2016-08-19 (×2): qty 2.5

## 2016-08-19 MED ORDER — VANCOMYCIN HCL IN DEXTROSE 1-5 GM/200ML-% IV SOLN
1000.0000 mg | Freq: Once | INTRAVENOUS | Status: AC
  Administered 2016-08-20: 1000 mg via INTRAVENOUS
  Filled 2016-08-19: qty 200

## 2016-08-19 MED ORDER — STERILE WATER FOR INJECTION IV SOLN
INTRAVENOUS | Status: DC
  Filled 2016-08-19: qty 850

## 2016-08-19 MED ORDER — SODIUM CHLORIDE 0.9 % IV BOLUS (SEPSIS)
2000.0000 mL | Freq: Once | INTRAVENOUS | Status: AC
  Administered 2016-08-20: 2000 mL via INTRAVENOUS

## 2016-08-19 NOTE — Telephone Encounter (Signed)
To be advised.  Trish the patient's caregiver called and stated the patient has been lethargic and constantly sleeping since 08/18/2016 at 5 pm. Caregiver stated yesterday the patient did not have much food or this morning and has been laying in the floor since 3 am because she tried to get the patient to stand and she fell. I asked the care giver if she had checked the patient's blood sugar and she stated she checked at 8 am this morning at 8 am and the meter read high. Patient was given her insulin 45 units after blood sugar was checked and 3 hrs later the blood sugar was still reading high. I advised the care giver to call EMS so they could transport her to the nearest hospital. The patient's care giver stated the patient could not go to [place] and needed to be transported to [facility]. I asked the patient's care giver why the patient could not be transported to [facility] and she advised me Dr. Everardo AllEllison and Dr. Clent RidgesFry had advised her to never go to [facility] again because of gallbladder surgery she had 6 years ago that was done incorrectly. I advised the patient I could not confirm this statement in any of Dr. George HughEllison's recent office notes and Dr. Everardo AllEllison was not in the office to confirm this either. I advised the caregiver to call EMS and have them come and evaluate the patient and transport her, but the caregiver declined. The caregiver stated she would contact Dr. Claris CheFry's office and ask his staff on how to proceed.

## 2016-08-19 NOTE — Telephone Encounter (Signed)
Receiving call from Trish regarding pt is at [facility] and is unresponsive and they are requesting DPR from our office before they will talk to her about pt. Rosann Auerbachrish said pt is in critical care and she needs to be able to speak to physicians and nurses. Per Dr.Hunter and Marcelino DusterMichelle okay to fax DPR to hospital for pt. Told Rosann Auerbachrish will fax over right away. Trish verbalized understanding. D{R faxed tp [phone].

## 2016-08-19 NOTE — Telephone Encounter (Signed)
Received call from Trish pt's caregiver regarding pt's  blood sugar has been elevated since yesterday the meter keeps reading high, she has checked it twice today and is reading high. Trish said pt is lethargic and is not eating. Told Trish needs to call 911 to have pt taken to [facility] per Dr.Fry. Trish verbalized understanding.

## 2016-08-19 NOTE — Telephone Encounter (Signed)
Trish called back to let me know that [facility] is at [facility] the ambulance would not [facility]. EMS told her that Dr.fry would have to call [facility] if he wants her transferred. Told Trish I will send Dr.Fry the message but he is out of the office till tomorrow. Trish verbalized understanding.

## 2016-08-19 NOTE — H&P (Signed)
PULMONARY / CRITICAL CARE MEDICINE   Name: Zoye Chandra MRN: [id number] DOB: 21-May-1956    ADMISSION DATE:  08/19/2016 CONSULTATION DATE:  08/19/2016  REFERRING MD:  Dr. Epimenio Foot EDP  CHIEF COMPLAINT:  Unresponsive  HISTORY OF PRESENT ILLNESS:   Mrs Fernandez has past medical history as below, which is significant for poorly controlled diabetes, Bipolar disorder, asthma/COPD, obstructive sleep apnea, hypertension, and GERD. Her diabetes is managed with our endocrinology, whom her caregiver contacted around 1 PM today 9/14 with complaints that the patient had been altered and laying on the floor since about 3 AM and her blood sugars have been reading as "high" on their home glucometer for the past 2 days. During that time. She had not been eating or drinking very much either. She was advised to go to the emergency department, EMS was contacted and transported her to [facility] emergency room. There, she was found to be profoundly hypertensive with leukocytosis. She was emergently intubated for airway protection. ABG taken at that time demonstrated profound metabolic acidosis with bicarbonate less than 7. PH 6.9. Urine was positive for ketones and she was started on insulin infusion, IV fluid resuscitation, and sodium bicarbonate infusion. She was also felt to possibly be septic and was started on broad-spectrum antibiotics. Source of possible infection not entirely clear at that point. Serial ABG samples did not demonstrate much improvement despite the aforementioned therapies. Based on lack of improvement and need for critical care admission she was transferred to [facility] ICU.   PAST MEDICAL HISTORY :  She  has a past medical history of Allergy; Asthma; Cystitis, interstitial; Depression; Diabetes mellitus without complication (HCC); GERD (gastroesophageal reflux disease); Hypertension; Pancreatic pseudocyst; Pneumonia; and Sleep apnea.  PAST SURGICAL HISTORY: She  has a past surgical  history that includes cystgastrostomy (09-02-12) and Cholecystectomy.  Allergies  Allergen Reactions  . Aspirin Hives, Shortness Of Breath, Itching and Rash    REACTION: unspecified  . Latex Hives, Shortness Of Breath and Rash    rash  . Penicillins Hives and Shortness Of Breath    Has patient had a PCN reaction causing immediate rash, facial/tongue/throat swelling, SOB or lightheadedness with hypotension: yes Has patient had a PCN reaction causing severe rash involving mucus membranes or skin necrosis: no Has patient had a PCN reaction that required hospitalization : yes Has patient had a PCN reaction occurring within the last 10 years: yes, 6 years ago If all of the above answers are "NO", then may proceed with Cephalosporin use.   . Sulfamethoxazole Hives and Shortness Of Breath    REACTION: unspecified  . Metformin And Related Hives and Diarrhea    combative  . Iodine Hives and Rash  . Povidone-Iodine Hives, Itching and Rash    REACTION: unspecified    No current facility-administered medications on file prior to encounter.    Current Outpatient Prescriptions on File Prior to Encounter  Medication Sig  . acetaminophen (TYLENOL) 325 MG tablet Take 2 tablets (650 mg total) by mouth every 6 (six) hours as needed for headache.  . albuterol (PROVENTIL) (2.5 MG/3ML) 0.083% nebulizer solution Take 3 mLs (2.5 mg total) by nebulization every 6 (six) hours as needed for wheezing or shortness of breath. (Patient taking differently: Take 2.5 mg by nebulization every 4 (four) hours. Every 4-6 hours)  . aspirin EC 81 MG tablet Take 1 tablet (81 mg total) by mouth daily.  . Blood Glucose Monitoring Suppl (ACCU-CHEK AVIVA PLUS) w/Device KIT Use to check blood sugar  4 times per day. Dx code. E11.  . cetirizine (ZYRTEC) 10 MG tablet Take 1 tablet (10 mg total) by mouth daily. For allergies  . COMBIVENT RESPIMAT 20-100 MCG/ACT AERS respimat Inhale 2 puffs into the lungs 2 (two) times daily.  Marland Kitchen  desvenlafaxine (PRISTIQ) 100 MG 24 hr tablet Take 1 tablet by mouth two times daily.  Marland Kitchen desvenlafaxine (PRISTIQ) 50 MG 24 hr tablet Take 1 tablet (50 mg total) by mouth daily.  . diazepam (VALIUM) 5 MG tablet Take 1 tablet (5 mg total) by mouth every 8 (eight) hours as needed for anxiety. (Patient taking differently: Take 5 mg by mouth 3 (three) times daily. )  . esomeprazole (NEXIUM) 40 MG capsule TAKE 1 CAPSULE DAILY FOR ACID REFLUX  . Fluticasone Furoate-Vilanterol (BREO ELLIPTA IN) Inhale 2 puffs into the lungs 2 (two) times daily.   Marland Kitchen glucose blood (ACCU-CHEK AVIVA PLUS) test strip Use to check blood sugar 4 times per day. Dx Code: E11.9  . imipramine (TOFRANIL) 50 MG tablet TAKE 1 TABLET AT BEDTIME  . Insulin Detemir (LEVEMIR FLEXTOUCH) 100 UNIT/ML Pen Inject 45 Units into the skin every morning. And pen needles 1/day  . lamoTRIgine (LAMICTAL) 100 MG tablet Take 1 tablet (100 mg total) by mouth daily. (Patient taking differently: Take 100 mg by mouth at bedtime. )  . Lancets (ACCU-CHEK MULTICLIX) lancets Use to check blood sugar 4 times per day. Dx Code: E11.9  . LEVEMIR FLEXTOUCH 100 UNIT/ML Pen Inject 45 Units into the skin daily with breakfast. Reported on 05/15/2016  . mometasone (NASONEX) 50 MCG/ACT nasal spray INSTILL 2 SPRAYS IN EACH NOSTRIL DAILY  . montelukast (SINGULAIR) 10 MG tablet TAKE 1 TABLET AT BEDTIME FOR ASTHMA  . nystatin cream (MYCOSTATIN) Apply 1 application topically 2 (two) times daily as needed for dry skin (rash).  Marland Kitchen oxybutynin (DITROPAN-XL) 10 MG 24 hr tablet TAKE 1 TABLET DAILY FOR BLADDER SPASM  . rOPINIRole (REQUIP) 1 MG tablet TAKE 1 TABLET AT BEDTIME FOR RESTLESS LEG SYNDROME  . SURE COMFORT PEN NEEDLES 31G X 8 MM MISC USE AS DIRECTED  . SYMBICORT 160-4.5 MCG/ACT inhaler USE 2 PUFFS TWICE DAILY FOR ASTHMA  . VENTOLIN HFA 108 (90 Base) MCG/ACT inhaler USE 2 PUFFS EVERY 6 HOURS AS NEEDED FOR WHEEZING/SHORTNESS OF BREATH    FAMILY HISTORY:  Her has no family  status information on file.    SOCIAL HISTORY: She  reports that she has never smoked. She has never used smokeless tobacco. She reports that she does not drink alcohol or use drugs.  REVIEW OF SYSTEMS:   Unable due to encephalopathy  SUBJECTIVE:    VITAL SIGNS: Ht _0  (1.651 m)   HEMODYNAMICS:    VENTILATOR SETTINGS: Vent Mode: PRVC  INTAKE / OUTPUT: No intake/output data recorded.  PHYSICAL EXAMINATION: General:  Elderly female, appears older than stated age on ventilator Neuro:  Obtunded on minimal sedation. Pupils very sluggish HEENT:  [place]/AT, no JVD Cardiovascular:  RRR, no MRG Lungs:  Clear Abdomen:  Soft, non-tender, non-distended Musculoskeletal:  No acute deformity or ROM limitation Skin:  Rash to sternal chest and under chin thought to be secondary to latex foley placed at [place]. Also excoriation to R inguinal fold.   LABS:  BMET No results for input(s): NA, K, CL, CO2, BUN, CREATININE, GLUCOSE in the last 168 hours.  Electrolytes No results for input(s): CALCIUM, MG, PHOS in the last 168 hours.  CBC No results for input(s): WBC, HGB, HCT, PLT in the  last 168 hours.  Coag's No results for input(s): APTT, INR in the last 168 hours.  Sepsis Markers No results for input(s): LATICACIDVEN, PROCALCITON, O2SATVEN in the last 168 hours.  ABG No results for input(s): PHART, PCO2ART, PO2ART in the last 168 hours.  Liver Enzymes No results for input(s): AST, ALT, ALKPHOS, BILITOT, ALBUMIN in the last 168 hours.  Cardiac Enzymes No results for input(s): TROPONINI, PROBNP in the last 168 hours.  Glucose No results for input(s): GLUCAP in the last 168 hours.  Imaging No results found.   STUDIES:    CULTURES: Blood Cultures Oval Linsey 9/14> Urine Cx Oval Linsey 9/14 >  ANTIBIOTICS: Levaquin 9/14 Vancomycin 9/14   SIGNIFICANT EVENTS:   LINES/TUBES: R femoral line 9/14 > ETT 9/14 >  DISCUSSION: 60 year old female past medical history of  diabetes, bipolar disorder, COPD, and reported dementia. She has had altered mental status for a couple of days during which period her blood sugars have been high. 9/14 a.m. she was found to be unresponsive and was transported to [facility] emergency department. Diagnosed with DKA potentially sepsis she was profoundly acidemic requiring intubation for airway protection. She was started on insulin, bicarbonate, antibiotics, and transferred to [place] / PLAN:  PULMONARY A: Inability to protect airway secondary to profound acidemia in setting of DKA. COPD without acute exacerbation  P:   Ventilator support High minute ventilation Trend ABG  CXR for tube placement Pulmonary hygiene Scheduled DuoNeb's, budesonide. When necessary albuterol  CARDIOVASCULAR A:  Shock, likely in the setting of hypovolemia, but also consider sepsis. Also less likely but consider anaphylaxis she has latex allergy and latex Foley was placed at [facility].  P:  Telemetry monitoring MAP goal greater than 65 mmHg Aggressive IVF resuscitation. Serial VBG Levothroid titrated for map goal I assume a crossover that  RENAL A:   Acute kidney injury suspect secondary to dehydration Pseudohyponatremia  High anion gap metabolic acidosis in the setting of ketonemia  P:   Aggressive IVF resuscitation. Serial BMP Suspect this will improve with adequate hydration. Continue sodium bicarbonate infusion and plan to DC once pH greater than 7.2   GASTROINTESTINAL A:   No acute issues  P:   Nothing by mouth for now. Pepcid for stress ulcer prophylaxis  HEMATOLOGIC A:   No acute issues P:  Follow CBC  Subcutaneous heparin for DVT prophylaxis   INFECTIOUS A:   Septic shock remains in differential. Potential source unclear.  L inguinal fold excoriation.  P:   Trend WBC and fever curve  Pro-calcitonin  Antibiotics as above, low threshold to DC antibiotics.  Wound care consult.    ENDOCRINE A:   Diabetic ketoacidosis with history of insulin-dependent diabetes.  P:   Insulin infusion per DKA protocol Aggressive IVF hydration  NEUROLOGIC A:   Acute metabolic encephalopathy in the setting of severe acidosis Bipolar disorder ? Dementia  P:   RASS goal:  0 to -1 fentanyl infusion, titrated for sedation and vent synchrony. Holding home antipsychotic medications   FAMILY  - Updates:  family updated by Dr. Elsworth Soho 9/14 p.m.  - Inter-disciplinary family meet or Palliative Care meeting due by:   9/21   Georgann Housekeeper, AGACNP-BC [place] Pulmonology/Critical Care Pager [phone] or [phone]  08/19/2016 11:35 PM

## 2016-08-19 NOTE — Telephone Encounter (Signed)
Dr. Fry, FYI 

## 2016-08-19 NOTE — Telephone Encounter (Signed)
Patient medical advisor Rosann Auerbachrish would like for you to call her concerning patient is trying to get the EMS to take patient to [facility] per Dr Clent RidgesFry they are giving her a hard time. Please advise

## 2016-08-19 NOTE — Telephone Encounter (Signed)
I contacted the patient she advised me the EMS would not transfer the patient to [facility], but would to [facility]. [facility].

## 2016-08-19 NOTE — Telephone Encounter (Signed)
Dr.Fry, FYI about pt. Please see message.

## 2016-08-20 ENCOUNTER — Inpatient Hospital Stay (HOSPITAL_COMMUNITY): Payer: Medicare Other

## 2016-08-20 ENCOUNTER — Encounter (HOSPITAL_COMMUNITY): Payer: Self-pay | Admitting: Pulmonary Disease

## 2016-08-20 DIAGNOSIS — R579 Shock, unspecified: Secondary | ICD-10-CM

## 2016-08-20 LAB — BLOOD GAS, VENOUS
ACID-BASE DEFICIT: 22.6 mmol/L — AB (ref 0.0–2.0)
ACID-BASE DEFICIT: 12.4 mmol/L — AB (ref 0.0–2.0)
ACID-BASE DEFICIT: 11.2 mmol/L — AB (ref 0.0–2.0)
ACID-BASE DEFICIT: 9.2 mmol/L — AB (ref 0.0–2.0)
Acid-base deficit: 14.8 mmol/L — ABNORMAL HIGH (ref 0.0–2.0)
BICARBONATE: 8.1 mmol/L — AB (ref 20.0–28.0)
BICARBONATE: 10.9 mmol/L — AB (ref 20.0–28.0)
Bicarbonate: 12.3 mmol/L — ABNORMAL LOW (ref 20.0–28.0)
Bicarbonate: 12.5 mmol/L — ABNORMAL LOW (ref 20.0–28.0)
Bicarbonate: 13.9 mmol/L — ABNORMAL LOW (ref 20.0–28.0)
DRAWN BY: 29219
DRAWN BY: 29219
DRAWN BY: 103701
FIO2: 30
FIO2: 30
FIO2: 30
FIO2: 0.3
LHR: 32 {breaths}/min
O2 SAT: 75.6 %
O2 SAT: 81.2 %
O2 SAT: 83.6 %
O2 Saturation: 84.1 %
O2 Saturation: 83.3 %
PATIENT TEMPERATURE: 97.5
PCO2 VEN: 29.2 mmHg — AB (ref 44.0–60.0)
PCO2 VEN: 24.9 mmHg — AB (ref 44.0–60.0)
PCO2 VEN: 23.2 mmHg — AB (ref 44.0–60.0)
PEEP/CPAP: 5 cmH2O
PEEP/CPAP: 5 cmH2O
PEEP: 5 cmH2O
PEEP: 5 cmH2O
PH VEN: 7.051 — AB (ref 7.250–7.430)
PH VEN: 7.311 (ref 7.250–7.430)
PO2 VEN: 36.2 mmHg (ref 32.0–45.0)
PO2 VEN: 43.2 mmHg (ref 32.0–45.0)
PO2 VEN: 42.1 mmHg (ref 32.0–45.0)
Patient temperature: 94.5
Patient temperature: 97.7
Patient temperature: 98.6
Patient temperature: 98.6
RATE: 32 resp/min
RATE: 32 resp/min
VT: 500 mL
VT: 500 mL
VT: 500 mL
VT: 500 mL
pCO2, Ven: 25.4 mmHg — ABNORMAL LOW (ref 44.0–60.0)
pCO2, Ven: 22.3 mmHg — ABNORMAL LOW (ref 44.0–60.0)
pH, Ven: 7.253 (ref 7.250–7.430)
pH, Ven: 7.366 (ref 7.250–7.430)
pH, Ven: 7.394 (ref 7.250–7.430)
pO2, Ven: 32.6 mmHg (ref 32.0–45.0)
pO2, Ven: 40.1 mmHg (ref 32.0–45.0)

## 2016-08-20 LAB — BASIC METABOLIC PANEL
ANION GAP: 7 (ref 5–15)
Anion gap: 12 (ref 5–15)
Anion gap: 12 (ref 5–15)
Anion gap: 8 (ref 5–15)
Anion gap: 6 (ref 5–15)
BUN: 51 mg/dL — AB (ref 6–20)
BUN: 53 mg/dL — AB (ref 6–20)
BUN: 55 mg/dL — AB (ref 6–20)
BUN: 54 mg/dL — ABNORMAL HIGH (ref 6–20)
BUN: UNDETERMINED mg/dL (ref 6–20)
BUN: 55 mg/dL — AB (ref 6–20)
CALCIUM: UNDETERMINED mg/dL (ref 8.9–10.3)
CALCIUM: 7.8 mg/dL — AB (ref 8.9–10.3)
CHLORIDE: 112 mmol/L — AB (ref 101–111)
CO2: 12 mmol/L — ABNORMAL LOW (ref 22–32)
CO2: 13 mmol/L — ABNORMAL LOW (ref 22–32)
CO2: 15 mmol/L — ABNORMAL LOW (ref 22–32)
CO2: 15 mmol/L — AB (ref 22–32)
CO2: UNDETERMINED mmol/L (ref 22–32)
CO2: 14 mmol/L — ABNORMAL LOW (ref 22–32)
CREATININE: 1.32 mg/dL — AB (ref 0.44–1.00)
CREATININE: 1.44 mg/dL — AB (ref 0.44–1.00)
CREATININE: 1.27 mg/dL — AB (ref 0.44–1.00)
CREATININE: 1.37 mg/dL — AB (ref 0.44–1.00)
Calcium: 7.5 mg/dL — ABNORMAL LOW (ref 8.9–10.3)
Calcium: 7.7 mg/dL — ABNORMAL LOW (ref 8.9–10.3)
Calcium: 7.7 mg/dL — ABNORMAL LOW (ref 8.9–10.3)
Calcium: 7.8 mg/dL — ABNORMAL LOW (ref 8.9–10.3)
Chloride: 108 mmol/L (ref 101–111)
Chloride: 107 mmol/L (ref 101–111)
Chloride: 113 mmol/L — ABNORMAL HIGH (ref 101–111)
Chloride: UNDETERMINED mmol/L (ref 101–111)
Chloride: 112 mmol/L — ABNORMAL HIGH (ref 101–111)
Creatinine, Ser: 1.31 mg/dL — ABNORMAL HIGH (ref 0.44–1.00)
Creatinine, Ser: UNDETERMINED mg/dL (ref 0.44–1.00)
GFR calc Af Amer: 50 mL/min — ABNORMAL LOW (ref >60)
GFR calc Af Amer: 45 mL/min — ABNORMAL LOW (ref >60)
GFR calc Af Amer: 53 mL/min — ABNORMAL LOW (ref >60)
GFR calc Af Amer: 48 mL/min — ABNORMAL LOW (ref >60)
GFR, EST AFRICAN AMERICAN: 51 mL/min — AB (ref >60)
GFR, EST NON AFRICAN AMERICAN: 44 mL/min — AB (ref >60)
GFR, EST NON AFRICAN AMERICAN: 39 mL/min — AB (ref >60)
GFR, EST NON AFRICAN AMERICAN: 46 mL/min — AB (ref >60)
GFR, EST NON AFRICAN AMERICAN: 44 mL/min — AB (ref >60)
GFR, EST NON AFRICAN AMERICAN: 42 mL/min — AB (ref >60)
GLUCOSE: 368 mg/dL — AB (ref 65–99)
GLUCOSE: 301 mg/dL — AB (ref 65–99)
GLUCOSE: 194 mg/dL — AB (ref 65–99)
GLUCOSE: 162 mg/dL — AB (ref 65–99)
GLUCOSE: 189 mg/dL — AB (ref 65–99)
Glucose, Bld: UNDETERMINED mg/dL (ref 65–99)
POTASSIUM: 4.5 mmol/L (ref 3.5–5.1)
POTASSIUM: 3.4 mmol/L — AB (ref 3.5–5.1)
POTASSIUM: 2.8 mmol/L — AB (ref 3.5–5.1)
POTASSIUM: 2.7 mmol/L — AB (ref 3.5–5.1)
Potassium: UNDETERMINED mmol/L (ref 3.5–5.1)
Potassium: 4.5 mmol/L (ref 3.5–5.1)
SODIUM: 132 mmol/L — AB (ref 135–145)
SODIUM: 132 mmol/L — AB (ref 135–145)
SODIUM: 135 mmol/L (ref 135–145)
Sodium: 135 mmol/L (ref 135–145)
Sodium: UNDETERMINED mmol/L (ref 135–145)
Sodium: 132 mmol/L — ABNORMAL LOW (ref 135–145)

## 2016-08-20 LAB — URINALYSIS, ROUTINE W REFLEX MICROSCOPIC
Glucose, UA: >1000 mg/dL — AB
Ketones, ur: 40 mg/dL — AB
Leukocytes, UA: NEGATIVE
Nitrite: NEGATIVE
Protein, ur: 30 mg/dL — AB
SPECIFIC GRAVITY, URINE: 1.018 (ref 1.005–1.030)
pH: 5 (ref 5.0–8.0)

## 2016-08-20 LAB — AMYLASE: AMYLASE: 392 U/L — AB (ref 28–100)

## 2016-08-20 LAB — MAGNESIUM
Magnesium: 1.1 mg/dL — ABNORMAL LOW (ref 1.7–2.4)
Magnesium: 1.7 mg/dL (ref 1.7–2.4)

## 2016-08-20 LAB — GLUCOSE, CAPILLARY
GLUCOSE-CAPILLARY: 412 mg/dL — AB (ref 65–99)
GLUCOSE-CAPILLARY: 391 mg/dL — AB (ref 65–99)
GLUCOSE-CAPILLARY: 375 mg/dL — AB (ref 65–99)
GLUCOSE-CAPILLARY: 337 mg/dL — AB (ref 65–99)
GLUCOSE-CAPILLARY: 312 mg/dL — AB (ref 65–99)
GLUCOSE-CAPILLARY: 250 mg/dL — AB (ref 65–99)
GLUCOSE-CAPILLARY: 165 mg/dL — AB (ref 65–99)
GLUCOSE-CAPILLARY: 171 mg/dL — AB (ref 65–99)
GLUCOSE-CAPILLARY: 157 mg/dL — AB (ref 65–99)
GLUCOSE-CAPILLARY: 125 mg/dL — AB (ref 65–99)
GLUCOSE-CAPILLARY: 138 mg/dL — AB (ref 65–99)
GLUCOSE-CAPILLARY: 161 mg/dL — AB (ref 65–99)
GLUCOSE-CAPILLARY: 142 mg/dL — AB (ref 65–99)
GLUCOSE-CAPILLARY: 201 mg/dL — AB (ref 65–99)
GLUCOSE-CAPILLARY: 235 mg/dL — AB (ref 65–99)
Glucose-Capillary: 138 mg/dL — ABNORMAL HIGH (ref 65–99)
Glucose-Capillary: 188 mg/dL — ABNORMAL HIGH (ref 65–99)
Glucose-Capillary: 225 mg/dL — ABNORMAL HIGH (ref 65–99)
Glucose-Capillary: 279 mg/dL — ABNORMAL HIGH (ref 65–99)
Glucose-Capillary: 289 mg/dL — ABNORMAL HIGH (ref 65–99)
Glucose-Capillary: 296 mg/dL — ABNORMAL HIGH (ref 65–99)
Glucose-Capillary: 445 mg/dL — ABNORMAL HIGH (ref 65–99)
Glucose-Capillary: 505 mg/dL (ref 65–99)

## 2016-08-20 LAB — COMPREHENSIVE METABOLIC PANEL
ALBUMIN: 2.8 g/dL — AB (ref 3.5–5.0)
ALT: 18 U/L (ref 14–54)
AST: 27 U/L (ref 15–41)
Alkaline Phosphatase: 143 U/L — ABNORMAL HIGH (ref 38–126)
Anion gap: 18 — ABNORMAL HIGH (ref 5–15)
BUN: 50 mg/dL — AB (ref 6–20)
CHLORIDE: 106 mmol/L (ref 101–111)
CO2: 9 mmol/L — AB (ref 22–32)
Calcium: 7.7 mg/dL — ABNORMAL LOW (ref 8.9–10.3)
Creatinine, Ser: 1.57 mg/dL — ABNORMAL HIGH (ref 0.44–1.00)
GFR calc Af Amer: 41 mL/min — ABNORMAL LOW (ref >60)
GFR calc non Af Amer: 35 mL/min — ABNORMAL LOW (ref >60)
GLUCOSE: 542 mg/dL — AB (ref 65–99)
POTASSIUM: 3 mmol/L — AB (ref 3.5–5.1)
Sodium: 133 mmol/L — ABNORMAL LOW (ref 135–145)
Total Bilirubin: 1.6 mg/dL — ABNORMAL HIGH (ref 0.3–1.2)
Total Protein: 5.6 g/dL — ABNORMAL LOW (ref 6.5–8.1)

## 2016-08-20 LAB — LACTIC ACID, PLASMA
LACTIC ACID, VENOUS: 2.1 mmol/L — AB (ref 0.5–1.9)
Lactic Acid, Venous: 2 mmol/L (ref 0.5–1.9)

## 2016-08-20 LAB — LIPASE, BLOOD: LIPASE: 57 U/L — AB (ref 11–51)

## 2016-08-20 LAB — TROPONIN I
TROPONIN I: 0.11 ng/mL — AB (ref <0.03)
Troponin I: <0.03 ng/mL (ref <0.03)
Troponin I: 0.08 ng/mL (ref <0.03)

## 2016-08-20 LAB — PROCALCITONIN
Procalcitonin: 0.92 ng/mL
Procalcitonin: 1.83 ng/mL

## 2016-08-20 LAB — URINE MICROSCOPIC-ADD ON

## 2016-08-20 LAB — ECHOCARDIOGRAM COMPLETE
HEIGHTINCHES: 65 in
WEIGHTICAEL: 2289.26 [oz_av]

## 2016-08-20 LAB — PHOSPHORUS: Phosphorus: 2.1 mg/dL — ABNORMAL LOW (ref 2.5–4.6)

## 2016-08-20 LAB — STREP PNEUMONIAE URINARY ANTIGEN: STREP PNEUMO URINARY ANTIGEN: NEGATIVE

## 2016-08-20 LAB — CK
CK TOTAL: 869 U/L — AB (ref 38–234)
Total CK: 606 U/L — ABNORMAL HIGH (ref 38–234)

## 2016-08-20 LAB — MRSA PCR SCREENING: MRSA BY PCR: POSITIVE — AB

## 2016-08-20 LAB — BETA-HYDROXYBUTYRIC ACID: BETA-HYDROXYBUTYRIC ACID: 5.36 mmol/L — AB (ref 0.05–0.27)

## 2016-08-20 LAB — CORTISOL: Cortisol, Plasma: 52.3 ug/dL

## 2016-08-20 MED ORDER — ORAL CARE MOUTH RINSE
15.0000 mL | Freq: Four times a day (QID) | OROMUCOSAL | Status: DC
  Administered 2016-08-20 – 2016-08-27 (×21): 15 mL via OROMUCOSAL

## 2016-08-20 MED ORDER — VANCOMYCIN HCL IN DEXTROSE 1-5 GM/200ML-% IV SOLN
1000.0000 mg | INTRAVENOUS | Status: DC
  Administered 2016-08-20 – 2016-08-23 (×4): 1000 mg via INTRAVENOUS
  Filled 2016-08-20 (×3): qty 200

## 2016-08-20 MED ORDER — POTASSIUM CHLORIDE 10 MEQ/50ML IV SOLN
10.0000 meq | INTRAVENOUS | Status: AC
  Administered 2016-08-20 (×6): 10 meq via INTRAVENOUS
  Filled 2016-08-20 (×4): qty 50

## 2016-08-20 MED ORDER — MAGNESIUM SULFATE 2 GM/50ML IV SOLN
2.0000 g | Freq: Once | INTRAVENOUS | Status: AC
  Administered 2016-08-20: 2 g via INTRAVENOUS
  Filled 2016-08-20: qty 50

## 2016-08-20 MED ORDER — SODIUM CHLORIDE 0.9% FLUSH
10.0000 mL | Freq: Two times a day (BID) | INTRAVENOUS | Status: DC
  Administered 2016-08-20 – 2016-08-23 (×8): 10 mL
  Administered 2016-08-23: 40 mL
  Administered 2016-08-24 – 2016-08-26 (×5): 10 mL
  Administered 2016-08-26: 20 mL
  Administered 2016-08-27: 10 mL

## 2016-08-20 MED ORDER — MUPIROCIN 2 % EX OINT
1.0000 "application " | TOPICAL_OINTMENT | Freq: Two times a day (BID) | CUTANEOUS | Status: AC
  Administered 2016-08-20 – 2016-08-24 (×10): 1 via NASAL
  Filled 2016-08-20: qty 22

## 2016-08-20 MED ORDER — NOREPINEPHRINE BITARTRATE 1 MG/ML IV SOLN
0.0000 ug/min | INTRAVENOUS | Status: DC
  Administered 2016-08-20: 4 ug/min via INTRAVENOUS
  Filled 2016-08-20: qty 16

## 2016-08-20 MED ORDER — MAGNESIUM SULFATE 4 GM/100ML IV SOLN
4.0000 g | Freq: Once | INTRAVENOUS | Status: AC
  Administered 2016-08-20: 4 g via INTRAVENOUS
  Filled 2016-08-20: qty 100

## 2016-08-20 MED ORDER — NYSTATIN 100000 UNIT/GM EX POWD
Freq: Two times a day (BID) | CUTANEOUS | Status: DC
  Administered 2016-08-20 – 2016-08-27 (×15): via TOPICAL
  Filled 2016-08-20: qty 15

## 2016-08-20 MED ORDER — LEVOFLOXACIN IN D5W 750 MG/150ML IV SOLN
750.0000 mg | INTRAVENOUS | Status: DC
  Administered 2016-08-21: 750 mg via INTRAVENOUS
  Filled 2016-08-20 (×2): qty 150

## 2016-08-20 MED ORDER — ONDANSETRON HCL 4 MG/2ML IJ SOLN
INTRAMUSCULAR | Status: AC
  Administered 2016-08-20: 4 mg
  Filled 2016-08-20: qty 2

## 2016-08-20 MED ORDER — CHLORHEXIDINE GLUCONATE 0.12% ORAL RINSE (MEDLINE KIT)
15.0000 mL | Freq: Two times a day (BID) | OROMUCOSAL | Status: DC
  Administered 2016-08-20 – 2016-08-26 (×12): 15 mL via OROMUCOSAL

## 2016-08-20 MED ORDER — POTASSIUM CHLORIDE 20 MEQ/15ML (10%) PO SOLN
40.0000 meq | Freq: Once | ORAL | Status: AC
  Administered 2016-08-20: 40 meq via ORAL
  Filled 2016-08-20: qty 30

## 2016-08-20 MED ORDER — SODIUM CHLORIDE 0.9 % IV BOLUS (SEPSIS)
1000.0000 mL | Freq: Once | INTRAVENOUS | Status: AC
  Administered 2016-08-20: 1000 mL via INTRAVENOUS

## 2016-08-20 MED ORDER — SODIUM CHLORIDE 0.9% FLUSH: 10.0000 mL | INTRAVENOUS | Status: DC | PRN

## 2016-08-20 MED ORDER — SODIUM CHLORIDE 0.9 % IV SOLN
INTRAVENOUS | Status: DC
  Administered 2016-08-20: 12:00:00 via INTRAVENOUS

## 2016-08-20 MED ORDER — POTASSIUM PHOSPHATES 15 MMOLE/5ML IV SOLN
30.0000 mmol | Freq: Once | INTRAVENOUS | Status: AC
  Administered 2016-08-20: 30 mmol via INTRAVENOUS
  Filled 2016-08-20: qty 10

## 2016-08-20 MED ORDER — DEXTROSE-NACL 5-0.45 % IV SOLN
INTRAVENOUS | Status: DC
  Administered 2016-08-20 – 2016-08-21 (×2): via INTRAVENOUS

## 2016-08-20 MED ORDER — CHLORHEXIDINE GLUCONATE 0.12% ORAL RINSE (MEDLINE KIT)
15.0000 mL | Freq: Two times a day (BID) | OROMUCOSAL | Status: DC
  Administered 2016-08-20: 15 mL via OROMUCOSAL

## 2016-08-20 MED ORDER — CHLORHEXIDINE GLUCONATE CLOTH 2 % EX PADS
6.0000 | MEDICATED_PAD | Freq: Every day | CUTANEOUS | Status: DC
  Administered 2016-08-20 – 2016-08-23 (×4): 6 via TOPICAL

## 2016-08-20 MED ORDER — POTASSIUM CHLORIDE CRYS ER 20 MEQ PO TBCR: 40.0000 meq | EXTENDED_RELEASE_TABLET | Freq: Two times a day (BID) | ORAL | Status: DC

## 2016-08-20 MED ORDER — POTASSIUM CHLORIDE CRYS ER 20 MEQ PO TBCR
20.0000 meq | EXTENDED_RELEASE_TABLET | Freq: Once | ORAL | Status: AC
  Administered 2016-08-20: 20 meq via ORAL
  Filled 2016-08-20: qty 1

## 2016-08-20 MED ORDER — POTASSIUM CHLORIDE CRYS ER 20 MEQ PO TBCR
40.0000 meq | EXTENDED_RELEASE_TABLET | Freq: Two times a day (BID) | ORAL | Status: DC
  Administered 2016-08-20: 40 meq via ORAL
  Filled 2016-08-20: qty 2

## 2016-08-20 MED ORDER — POTASSIUM CHLORIDE 10 MEQ/50ML IV SOLN
10.0000 meq | INTRAVENOUS | Status: AC
  Administered 2016-08-20 (×4): 10 meq via INTRAVENOUS
  Filled 2016-08-20 (×4): qty 50

## 2016-08-20 NOTE — Care Management Note (Signed)
Case Management Note  Patient Details  Name: Isabel [name] MRN: [id number] Date of Birth: Jul 20, 1956  Subjective/Objective:         Multiple system shut down /on vent/          Action/Plan:  tbd   Expected Discharge Date:                  Expected Discharge Plan:  Home/Self Care  In-House Referral:     Discharge planning Services     Post Acute Care Choice:    Choice offered to:     DME Arranged:    DME Agency:     HH Arranged:    HH Agency:     Status of Service:  In process, will continue to follow  If discussed at Long Length of Stay Meetings, dates discussed:    Additional Comments: Date:  August 20, 2016 Chart reviewed for concurrent status and case management needs. Will continue to follow the patient for status change: Discharge Planning: following for needs Expected discharge date: [id number] Marcelle SmilingRhonda Davis, BSN, [place], [place]   [phone]  Isabel [name], Isabel Lynn, Isabel [name] 08/20/2016, 9:55 AM

## 2016-08-20 NOTE — Progress Notes (Signed)
Initial Nutrition Assessment  DOCUMENTATION CODES:   Not applicable  INTERVENTION:  - If pt to remain intubated >/= 24 hours, recommend Glucerna 1.2 @ 20 mL/hr and increase by 10 mL every 6 hours to reach goal rate of Glucerna 1.2 @ 60 mL/hr. At goal rate, this regimen will provide 1728 kcal, 86 grams of protein, and 1159 mL free water.  - RD will follow-up 9/18.  NUTRITION DIAGNOSIS:   Inadequate oral intake related to inability to eat as evidenced by NPO status.  GOAL:   Patient will meet greater than or equal to 90% of their needs  MONITOR:   Vent status, Weight trends, Labs, I & O's  REASON FOR ASSESSMENT:   Ventilator, Low Braden  ASSESSMENT:   60 year-old with past medical history significant for poorly controlled diabetes, bipolar disorder, asthma/COPD, obstructive sleep apnea, hypertension, and GERD. Her diabetes is managed with our endocrinology, whom her caregiver contacted around 1 PM today 9/14 with complaints that the patient had been altered and laying on the floor since about 3 AM and her blood sugars have been reading as "high" on their home glucometer for the past 2 days. During that time she had not been eating or drinking very much. EMS [facility] where she was found to be profoundly hypertensive with leukocytosis. She was emergently intubated for airway protection. ABG taken at that time demonstrated profound metabolic acidosis with bicarbonate less than 7. Based on lack of improvement and need for critical care admission she was transferred [facility].  BMI indicates normal weight. Pt with OGT to suction with ~100cc dark brown drainage present at time of RD visit. No family/visitors present at this and per rounds, no family/visitors present earlier this shift.   Physical assessment shows no muscle or fat wasting. Per chart review, weight mainly stable/trending up since March 2017 with CBW +9 lbs since March and +2 lbs since  June. TF recommendations outlined above and RD will follow-up 9/18.  Patient is currently intubated on ventilator support MV: 16.4 L/min Temp (24hrs), Avg:97.9 F (36.6 C), Min:94.5 F (34.7 C), Max:99.7 F (37.6 C) Propofol: none   Medications reviewed; 4 mg IV Mg sulfate x1 dose today, 10 mEq IV KCl x4 runs today, 20 mEq KCl via OGT x1 dose today, 30 mmol KPhos x1 dose today.   Labs reviewed; CBGs: 279-505 mg/dL since midnight, Na: 956132 mmol/L, BUN: 51 mg/dL, creatinine: 2.131.32 mg/dL, Ca: 7.5 mg/dL, GFR: 44 mL/min.   IVF: NS @ 100 mL/hr.  Drips: Levo @ 6 mcg/min, Fentanyl @ 50 mcg/hr, Insulin @ 27.5 units/hr.    Diet Order:  Diet NPO time specified  Skin:  Reviewed, no issues  Last BM:  PTA  Height:   Ht Readings from Last 1 Encounters:  08/19/16 5\' 5"  (1.651 m)    Weight:   Wt Readings from Last 1 Encounters:  08/20/16 143 lb 1.3 oz (64.9 kg)    Ideal Body Weight:  56.82 kg  BMI:  Body mass index is 23.81 kg/m.  Estimated Nutritional Needs:   Kcal:  1760  Protein:  78-97 grams (1.2-1.5 grams/kg)  Fluid:  1.8 L/day  EDUCATION NEEDS:   No education needs identified at this time    Trenton GammonJessica Sacred Roa, MS, RD, LDN Inpatient Clinical Dietitian Pager # [phone] After hours/weekend pager # [phone]

## 2016-08-20 NOTE — Progress Notes (Signed)
eLink Physician-Brief Progress Note Patient Name: Isabel [name] Isabel [name] DOB: 02-26-1956 MRN: [id number]   Date of Service  08/20/2016  HPI/Events of Note  Hypokalemia, hypophos, hypomag  eICU Interventions  Potassium , phos, and mag replaced     Intervention Category Intermediate Interventions: Electrolyte abnormality - evaluation and management  DETERDING,ELIZABETH 08/20/2016, 12:53 AM

## 2016-08-20 NOTE — Telephone Encounter (Signed)
I contacted the patient's care giver and advised of the message. She voiced understanding.

## 2016-08-20 NOTE — Progress Notes (Signed)
PULMONARY / CRITICAL CARE MEDICINE   Name: Isabel [name] MRN: [id number] DOB: Jul 17, 1956    ADMISSION DATE:  08/19/2016 CONSULTATION DATE:  08/19/2016  REFERRING MD:  Dr. Epimenio Foot EDP  CHIEF COMPLAINT:  Unresponsive  HISTORY OF PRESENT ILLNESS:   Isabel [name] has past medical history as below, which is significant for poorly controlled diabetes, Bipolar disorder, asthma/COPD, obstructive sleep apnea, hypertension, and GERD. Her diabetes is managed with our endocrinology, whom her caregiver contacted around 1 PM today 9/14 with complaints that the patient had been altered and laying on the floor since about 3 AM and her blood sugars have been reading as "high" on their home glucometer for the past 2 days. During that time. She had not been eating or drinking very much either. She was advised to go to the emergency department, EMS was contacted and transported her to [facility] emergency room. There, she was found to be profoundly hypertensive with leukocytosis. She was emergently intubated for airway protection. ABG taken at that time demonstrated profound metabolic acidosis with bicarbonate less than 7. PH 6.9. Urine was positive for ketones and she was started on insulin infusion, IV fluid resuscitation, and sodium bicarbonate infusion. She was also felt to possibly be septic and was started on broad-spectrum antibiotics. Source of possible infection not entirely clear at that point. Serial ABG samples did not demonstrate much improvement despite the aforementioned therapies. Based on lack of improvement and need for critical care admission she was transferred to [facility] ICU.     SUBJECTIVE: K, Mag, Phos replaced per ELink.  RN reports pt remains on insulin gtt at 19 units/hr, glucose remains >250, levophed at 15 mcg.  Fentanyl gtt turned off ~ 0800.  No acute events overnight.    VITAL SIGNS: BP (!) 103/53   Pulse (!) 104   Temp 97.5 F (36.4 C) (Rectal)   Resp (!) 32   Ht 5\' 5"   (1.651 m)   Wt 143 lb 1.3 oz (64.9 kg)   SpO2 98%   BMI 23.81 kg/m   HEMODYNAMICS:    VENTILATOR SETTINGS: Vent Mode: PRVC FiO2 (%):  [30 %] 30 % Set Rate:  [32 bmp] 32 bmp Vt Set:  [500 mL] 500 mL PEEP:  [5 cmH20] 5 cmH20 Plateau Pressure:  [17 cmH20-20 cmH20] 20 cmH20  INTAKE / OUTPUT: No intake/output data recorded.  PHYSICAL EXAMINATION: General:  Elderly female, appears older than stated age on ventilator Neuro:  Obtunded on minimal sedation. Pupils very sluggish HEENT:  [place]/AT, no JVD Cardiovascular:  RRR, no MRG Lungs:  Clear Abdomen:  Soft, non-tender, non-distended Musculoskeletal:  No acute deformity or ROM limitation Skin:  Rash to sternal chest and under chin thought to be secondary to latex foley placed at [place]. Also excoriation to R inguinal fold.   LABS:  BMET  Recent Labs Lab 08/19/16 2250 08/20/16 0430  NA 133* 132*  K 3.0* 4.5  CL 106 108  CO2 9* 12*  BUN 50* 51*  CREATININE 1.57* 1.32*  GLUCOSE 542* 368*    Electrolytes  Recent Labs Lab 08/19/16 2250 08/20/16 0430  CALCIUM 7.7* 7.5*  MG 1.1*  --   PHOS 2.1*  --     CBC No results for input(s): WBC, HGB, HCT, PLT in the last 168 hours.  Coag's No results for input(s): APTT, INR in the last 168 hours.  Sepsis Markers  Recent Labs Lab 08/19/16 2250 08/20/16 0140 08/20/16 0430  LATICACIDVEN 2.1* 2.0*  --  PROCALCITON 0.92  --  1.83    ABG  Recent Labs Lab 08/19/16 2258  PHART 7.091*  PCO2ART 21.7*  PO2ART 117*    Liver Enzymes  Recent Labs Lab 08/19/16 2250  AST 27  ALT 18  ALKPHOS 143*  BILITOT 1.6*  ALBUMIN 2.8*    Cardiac Enzymes  Recent Labs Lab 08/19/16 2250 08/20/16 0430  TROPONINI <0.03 0.08*    Glucose  Recent Labs Lab 08/20/16 0226 08/20/16 0323 08/20/16 0435 08/20/16 0540 08/20/16 0635 08/20/16 0737  GLUCAP 412* 391* 375* 337* 296* 279*    Imaging Dg Chest Port 1 View  Result Date: 08/20/2016 CLINICAL DATA:  Acute  hypoxic respiratory failure. EXAM: PORTABLE CHEST 1 VIEW COMPARISON:  08/19/2016 at 1448 hours FINDINGS: Endotracheal tube terminates at the level of the clavicular heads. Enteric tube terminates over the distal esophagus near the GE junction. The cardiomediastinal silhouette is within normal limits. Lung volumes are mildly diminished, and there is increased patchy opacity in the left lung base. No sizable pleural effusion or pneumothorax is identified. IMPRESSION: 1. Increased left basilar opacities which may reflect atelectasis or pneumonia. 2. Enteric tube tip at the distal esophagus/GE junction. Recommend advancement. Electronically Signed   By: Sebastian AcheAllen  Grady M.D.   On: 08/20/2016 00:09     STUDIES:    CULTURES: Blood Cultures Isabel [name] 9/14 >> Urine Cx Isabel SalviaRandolph 9/14 >> MRSA PCR 9/14 >> positive  ANTIBIOTICS: Levaquin 9/14 >> Vancomycin 9/14 >>   SIGNIFICANT EVENTS: 9/14  Admit with DKA  LINES/TUBES: R femoral line 9/14 >> ETT 9/14 >>  DISCUSSION: 60 year old female past medical history of diabetes, bipolar disorder, COPD, and reported dementia. She has had altered mental status for a couple of days during which period her blood sugars have been high. 9/14 a.m. she was found to be unresponsive and [facility]. Diagnosed with DKA potentially sepsis she was profoundly acidemic requiring intubation for airway protection. She was started on insulin, bicarbonate, antibiotics, and transferred [facility].  ASSESSMENT / PLAN:  PULMONARY A: Inability to protect airway secondary to profound acidemia in setting of DKA. COPD without acute exacerbation P:   PRVC 8 cc/kg  High minute ventilation for now, may need adjust vent rate later this pm Trend ABG  Intermittent CXR Pulmonary hygiene Follow venous blood gas Scheduled DuoNeb's, budesonide. PRN albuterol  CARDIOVASCULAR A:  Shock - likely in the setting of hypovolemia, but ddx  includes sepsis. Also, less likely but consider anaphylaxis she has latex allergy and latex [facility]. P:  Telemetry monitoring MAP goal greater than 65 mmHg Additional 1L NS now  NS @ 75 ml/hr Levophed titrated for map goal Will need to remove femoral TLC - uncertain conditions of placement.  PICC line ordered.    RENAL A:   Acute kidney injury - suspect secondary to dehydration, possible rhabdo with lying on floor Pseudohyponatremia  High anion gap metabolic acidosis in the setting of ketonemia P:   IVF as above  Trend BMP / UOP Suspect this will improve with adequate hydration. D/C bicarb gtt  Trend CK   GASTROINTESTINAL A:   Ventilator Dependent  P:   NPO  OGT  Pepcid for stress ulcer prophylaxis Consider TF in am 9/16  HEMATOLOGIC A:   No acute issues P:  Follow CBC  Subcutaneous heparin for DVT prophylaxis   INFECTIOUS A:   Septic shock remains in differential. Potential source unclear.  L inguinal fold excoriation. P:  Trend WBC and fever curve  Pro-calcitonin  Antibiotics as above, low threshold to DC antibiotics.  Wound care consult.  Add MRSA nasal Rx   ENDOCRINE A:   Diabetic ketoacidosis with history of insulin-dependent diabetes. P:   Insulin infusion per DKA protocol IVF as above  NEUROLOGIC A:   Acute metabolic encephalopathy in the setting of severe acidosis Bipolar disorder ? Dementia P:   RASS goal:  0 to -1 Fentanyl infusion, titrated for sedation and vent synchrony. Hold home antipsychotic medications > consider restart am 9/16   FAMILY  - Updates:  No family at bedside am 9/15.    - Inter-disciplinary family meet or Palliative Care meeting due by:   9/21  Canary Brim, NP-C [place] [facility] Pgr: [phone] or if no answer [phone] 08/20/2016, 8:10 AM  Attending note: I have seen and examined the patient with nurse practitioner/resident and agree with the note. History, labs and  imaging reviewed.  60 Y/O with poorly controlled DM, Bipolar, asthma, COPD, OSA, HTN, GERD admitted with shock, metabolic acidosis, DKA. Overnight she remained critically ill, on vent and levophed.   Blood pressure (!) 96/52, pulse (!) 108, temperature 99.7 F (37.6 C), temperature source Oral, resp. rate (!) 32, height 5\' 5"  (1.651 m), weight 143 lb 1.3 oz (64.9 kg), SpO2 95 %. Sedated, unresponsive CVS-RRR RS-Clear Abd- Soft, + BS Ext- No edema  Labs reviewed CXR personally reviewed > Shows mild lt basal opacity  Assessment/ Plan: Shock,  May be septic from pneumonia Diabetes ketoacidosis Severe metabolic acidosis Acute kidney imjury  - Continue DKA protocol  - Additional fluid bolus now and then infusion at 75/hr. Wean off pressors - Continue broad spectrum abx as Pct is hight - On full vent support. Check venous gas, unable to obtain ABG. - Stop bicarb drip - Remove femoral line. PICC line placement.  Rest of plan as above.  The patient is critically ill with multiple organ systems failure and requires high complexity decision making for assessment and support, frequent evaluation and titration of therapies, application of advanced monitoring technologies and extensive interpretation of multiple databases. Critical care time - 35 mins. This represents my time independent of the NPs time taking care of the pt.  Chilton Greathouse MD [place] [facility] Pager [phone] If no answer or after 3pm call: [phone] 08/20/2016, 11:22 AM

## 2016-08-20 NOTE — Progress Notes (Signed)
CRITICAL VALUE ALERT  Critical value received:  + MRSA  Date of notification:  08/20/16  Time of notification:  0915  Critical value read back:Yes.    Nurse who received alert:  Santiago GladAllison Katryna Tschirhart    MD notified (1st page):  Canary BrimBrandi Ollis, NP  Time of first page:  Face to face at [phone]

## 2016-08-20 NOTE — Progress Notes (Signed)
eLink Physician-Brief Progress Note Patient Name: Isabel [name] Isabel [name] DOB: 11-Mar-1956 MRN: [id number]   Date of Service  08/20/2016  HPI/Events of Note  Notified by bedside nurse of patient's persistent hypokalemia. Currently not intubated and taking oral medications. Currently on insulin drip. Anion gap has closed although patient still with some metabolic acidosis. Hypomagnesemia noted yesterday but no serum lab work repeated.   eICU Interventions  1. Stat serum magnesium 2. KCl elixir 40 mEq by mouth 1 3. KCl 10 mEq IV 6 runs      Intervention Category Intermediate Interventions: Electrolyte abnormality - evaluation and management  Lawanda CousinsJennings Nestor 08/20/2016, 5:24 PM

## 2016-08-20 NOTE — Progress Notes (Signed)
eLink Physician-Brief Progress Note Patient Name: Isabel [name] Isabel [name] DOB: June 26, 1956 MRN: [id number]   Date of Service  08/20/2016  HPI/Events of Note  Hypomagnesemia 1.7. Serum creatinine 1.3.   eICU Interventions  Magnesium sulfate 2 g IV      Intervention Category Intermediate Interventions: Electrolyte abnormality - evaluation and management  Lawanda CousinsJennings Telicia Hodgkiss 08/20/2016, 6:22 PM

## 2016-08-20 NOTE — Telephone Encounter (Signed)
I don't have hospital priviliges.  However, I'll follow pt's progress on the computer. I hope pt feels better soon.

## 2016-08-20 NOTE — Telephone Encounter (Signed)
Trish patient caregiver stated that patient is in the ICU at [facility], doctor there said Dr Everardo AllEllison is welcome to come with any input he have to give.

## 2016-08-20 NOTE — Progress Notes (Signed)
CSW consulted to clarify if pt has a HCPOA. Pt is presently requiring vent support. Pt's cousin, Alois Clicherish Boyst [phone], contacted. Ms. Roosevelt LocksBoyst reports that pt requested that she be assigned HCPOA but documentation wasn't completed prior to pt's illness. Ms. Roosevelt LocksBoyst reports, " I have been assisting pt for years. " Ms. Boyst reports that there are no other family members involved. Ms. Roosevelt LocksBoyst requested that she be kept updated on pt's medical status.  Cori RazorJamie Markeith Jue LCSW [phone]

## 2016-08-20 NOTE — Telephone Encounter (Signed)
See messages to be advised.  

## 2016-08-20 NOTE — Progress Notes (Signed)
  Echocardiogram 2D Echocardiogram has been performed.  Janalyn HarderWest, Salem Lembke R 08/20/2016, 10:06 AM

## 2016-08-20 NOTE — Telephone Encounter (Signed)
Trish the caregiver state the [facility] is [facility] ICU and they state that she has a infection somewhere they are trying to locate it.  The doctors state that Dr. Clent RidgesFry can come and assist them with the care of pt and patient do not want the doctors to share any of her healthcare information to anyone other than Trish.

## 2016-08-20 NOTE — Progress Notes (Signed)
Peripherally Inserted Central Catheter/Midline Placement  The IV Nurse has discussed with the patient and/or persons authorized to consent for the patient, the purpose of this procedure and the potential benefits and risks involved with this procedure.  The benefits include less needle sticks, lab draws from the catheter, and the patient may be discharged home with the catheter. Risks include, but not limited to, infection, bleeding, blood clot (thrombus formation), and puncture of an artery; nerve damage and irregular heartbeat and possibility to perform a PICC exchange if needed/ordered by physician.  Alternatives to this procedure were also discussed.  Bard Power PICC patient education guide, fact sheet on infection prevention and patient information card has been provided to patient /or left at bedside.    PICC/Midline Placement Documentation    Information given to [facility]    Franne Gripewman, Denaly Gatling [facility] 08/20/2016, 10:56 AM

## 2016-08-20 NOTE — Progress Notes (Signed)
Pharmacy Antibiotic Note  Isabel [name] is a 60 y.o. female admitted on 08/19/2016 with sepsis.  Pharmacy has been consulted for vancomycin, levofloxacin dosing.  Plan: Vancomycin 1gm IV every 24 hours.  Goal trough 15-20 mcg/mL.  Levofloxacin 750mg  iv q48hr  Height: 5\' 5"  (165.1 cm) Weight: 143 lb 1.3 oz (64.9 kg) IBW/kg (Calculated) : 57  Temp (24hrs), Avg:94.5 F (34.7 C), Min:94.5 F (34.7 C), Max:94.5 F (34.7 C)   Recent Labs Lab 08/19/16 2250 08/20/16 0140 08/20/16 0430  CREATININE 1.57*  --  1.32*  LATICACIDVEN 2.1* 2.0*  --     Estimated Creatinine Clearance: 41.8 mL/min (by C-G formula based on SCr of 1.32 mg/dL (H)).    Allergies  Allergen Reactions  . Aspirin Hives, Shortness Of Breath, Itching and Rash    REACTION: unspecified  . Latex Hives, Shortness Of Breath and Rash    rash  . Penicillins Hives and Shortness Of Breath    Has patient had a PCN reaction causing immediate rash, facial/tongue/throat swelling, SOB or lightheadedness with hypotension: yes Has patient had a PCN reaction causing severe rash involving mucus membranes or skin necrosis: no Has patient had a PCN reaction that required hospitalization : yes Has patient had a PCN reaction occurring within the last 10 years: yes, 6 years ago If all of the above answers are "NO", then may proceed with Cephalosporin use.   . Sulfamethoxazole Hives and Shortness Of Breath    REACTION: unspecified  . Metformin And Related Hives and Diarrhea    combative  . Iodine Hives and Rash  . Povidone-Iodine Hives, Itching and Rash    REACTION: unspecified    Antimicrobials this admission: Vancomycin 08/19/2016 >> Levofloxacin 08/19/2016 >>   Dose adjustments this admission: -  Microbiology results: pending  Thank you for allowing pharmacy to be a part of this patient's care.  Isabel [name], Isabel [name] 08/20/2016 5:01 AM

## 2016-08-21 ENCOUNTER — Inpatient Hospital Stay (HOSPITAL_COMMUNITY): Payer: Medicare Other

## 2016-08-21 DIAGNOSIS — E0810 Diabetes mellitus due to underlying condition with ketoacidosis without coma: Secondary | ICD-10-CM

## 2016-08-21 DIAGNOSIS — N179 Acute kidney failure, unspecified: Secondary | ICD-10-CM

## 2016-08-21 LAB — CBC
HCT: 29.9 % — ABNORMAL LOW (ref 36.0–46.0)
Hemoglobin: 11.1 g/dL — ABNORMAL LOW (ref 12.0–15.0)
MCH: 26.7 pg (ref 26.0–34.0)
MCHC: 37.1 g/dL — AB (ref 30.0–36.0)
MCV: 72 fL — AB (ref 78.0–100.0)
PLATELETS: 114 10*3/uL — AB (ref 150–400)
RBC: 4.15 MIL/uL (ref 3.87–5.11)
RDW: 15.3 % (ref 11.5–15.5)
WBC: 16.1 10*3/uL — ABNORMAL HIGH (ref 4.0–10.5)

## 2016-08-21 LAB — BASIC METABOLIC PANEL
ANION GAP: 7 (ref 5–15)
BUN: 53 mg/dL — AB (ref 6–20)
CO2: 14 mmol/L — ABNORMAL LOW (ref 22–32)
Calcium: 7.9 mg/dL — ABNORMAL LOW (ref 8.9–10.3)
Chloride: 112 mmol/L — ABNORMAL HIGH (ref 101–111)
Creatinine, Ser: 1.39 mg/dL — ABNORMAL HIGH (ref 0.44–1.00)
GFR calc Af Amer: 47 mL/min — ABNORMAL LOW (ref >60)
GFR, EST NON AFRICAN AMERICAN: 41 mL/min — AB (ref >60)
GLUCOSE: 149 mg/dL — AB (ref 65–99)
POTASSIUM: 3.6 mmol/L (ref 3.5–5.1)
Sodium: 133 mmol/L — ABNORMAL LOW (ref 135–145)

## 2016-08-21 LAB — RENAL FUNCTION PANEL
Albumin: 2.3 g/dL — ABNORMAL LOW (ref 3.5–5.0)
Anion gap: 6 (ref 5–15)
BUN: 52 mg/dL — AB (ref 6–20)
CALCIUM: 8 mg/dL — AB (ref 8.9–10.3)
CHLORIDE: 113 mmol/L — AB (ref 101–111)
CO2: 14 mmol/L — AB (ref 22–32)
CREATININE: 1.47 mg/dL — AB (ref 0.44–1.00)
GFR calc Af Amer: 44 mL/min — ABNORMAL LOW (ref >60)
GFR calc non Af Amer: 38 mL/min — ABNORMAL LOW (ref >60)
GLUCOSE: 156 mg/dL — AB (ref 65–99)
Phosphorus: 1.2 mg/dL — ABNORMAL LOW (ref 2.5–4.6)
Potassium: 4 mmol/L (ref 3.5–5.1)
SODIUM: 133 mmol/L — AB (ref 135–145)

## 2016-08-21 LAB — GLUCOSE, CAPILLARY
GLUCOSE-CAPILLARY: 169 mg/dL — AB (ref 65–99)
GLUCOSE-CAPILLARY: 164 mg/dL — AB (ref 65–99)
GLUCOSE-CAPILLARY: 152 mg/dL — AB (ref 65–99)
GLUCOSE-CAPILLARY: 162 mg/dL — AB (ref 65–99)
GLUCOSE-CAPILLARY: 143 mg/dL — AB (ref 65–99)
GLUCOSE-CAPILLARY: 131 mg/dL — AB (ref 65–99)
Glucose-Capillary: 115 mg/dL — ABNORMAL HIGH (ref 65–99)
Glucose-Capillary: 118 mg/dL — ABNORMAL HIGH (ref 65–99)
Glucose-Capillary: 134 mg/dL — ABNORMAL HIGH (ref 65–99)
Glucose-Capillary: 141 mg/dL — ABNORMAL HIGH (ref 65–99)
Glucose-Capillary: 141 mg/dL — ABNORMAL HIGH (ref 65–99)
Glucose-Capillary: 142 mg/dL — ABNORMAL HIGH (ref 65–99)
Glucose-Capillary: 145 mg/dL — ABNORMAL HIGH (ref 65–99)
Glucose-Capillary: 158 mg/dL — ABNORMAL HIGH (ref 65–99)
Glucose-Capillary: 167 mg/dL — ABNORMAL HIGH (ref 65–99)
Glucose-Capillary: 195 mg/dL — ABNORMAL HIGH (ref 65–99)

## 2016-08-21 LAB — MAGNESIUM: Magnesium: 2 mg/dL (ref 1.7–2.4)

## 2016-08-21 LAB — HEMOGLOBIN A1C
HEMOGLOBIN A1C: 11.2 % — AB (ref 4.8–5.6)
Mean Plasma Glucose: 275 mg/dL

## 2016-08-21 LAB — PROCALCITONIN: Procalcitonin: 1.67 ng/mL

## 2016-08-21 MED ORDER — INSULIN ASPART 100 UNIT/ML ~~LOC~~ SOLN
0.0000 [IU] | SUBCUTANEOUS | Status: DC
  Administered 2016-08-21 (×2): 3 [IU] via SUBCUTANEOUS
  Administered 2016-08-21 – 2016-08-22 (×4): 4 [IU] via SUBCUTANEOUS
  Administered 2016-08-22 – 2016-08-23 (×2): 3 [IU] via SUBCUTANEOUS

## 2016-08-21 MED ORDER — VITAL HIGH PROTEIN PO LIQD
1000.0000 mL | ORAL | Status: DC
  Administered 2016-08-21: 1000 mL
  Filled 2016-08-21 (×2): qty 1000

## 2016-08-21 MED ORDER — DEXMEDETOMIDINE HCL IN NACL 200 MCG/50ML IV SOLN
0.0000 ug/kg/h | INTRAVENOUS | Status: DC
  Administered 2016-08-21 (×2): 0.4 ug/kg/h via INTRAVENOUS
  Filled 2016-08-21 (×2): qty 50

## 2016-08-21 MED ORDER — LAMOTRIGINE 100 MG PO TABS
100.0000 mg | ORAL_TABLET | Freq: Every day | ORAL | Status: DC
  Administered 2016-08-21 – 2016-08-27 (×5): 100 mg via ORAL
  Filled 2016-08-21 (×6): qty 1

## 2016-08-21 MED ORDER — FENTANYL CITRATE (PF) 100 MCG/2ML IJ SOLN
100.0000 ug | INTRAMUSCULAR | Status: DC | PRN
  Filled 2016-08-21: qty 2

## 2016-08-21 MED ORDER — SODIUM CHLORIDE 0.9 % IV SOLN: INTRAVENOUS | Status: DC

## 2016-08-21 MED ORDER — INSULIN DETEMIR 100 UNIT/ML ~~LOC~~ SOLN
40.0000 [IU] | Freq: Every day | SUBCUTANEOUS | Status: DC
  Administered 2016-08-21 – 2016-08-22 (×2): 40 [IU] via SUBCUTANEOUS
  Filled 2016-08-21 (×2): qty 0.4

## 2016-08-21 MED ORDER — POTASSIUM CHLORIDE CRYS ER 20 MEQ PO TBCR
20.0000 meq | EXTENDED_RELEASE_TABLET | Freq: Once | ORAL | Status: AC
  Administered 2016-08-21: 20 meq via ORAL
  Filled 2016-08-21: qty 1

## 2016-08-21 MED ORDER — FENTANYL CITRATE (PF) 100 MCG/2ML IJ SOLN
100.0000 ug | INTRAMUSCULAR | Status: DC | PRN
  Administered 2016-08-21 (×3): 100 ug via INTRAVENOUS
  Administered 2016-08-22: 50 ug via INTRAVENOUS
  Administered 2016-08-22 (×3): 100 ug via INTRAVENOUS
  Administered 2016-08-22: 25 ug via INTRAVENOUS
  Administered 2016-08-23 – 2016-08-24 (×6): 100 ug via INTRAVENOUS
  Filled 2016-08-21 (×8): qty 2

## 2016-08-21 NOTE — Progress Notes (Signed)
240cc of Morphine wasted in sink. Witnessed by Ezequiel KayserPaige Beck, RN.

## 2016-08-21 NOTE — Progress Notes (Signed)
PULMONARY / CRITICAL CARE MEDICINE   Name: Isabel [name] Isabel Lindquist MRN: [id number] DOB: March 17, 1956    ADMISSION DATE:  08/19/2016 CONSULTATION DATE:  08/19/2016  REFERRING MD:  Dr. Epimenio FootSnyder Cochran EDP  CHIEF COMPLAINT:  Unresponsive  HISTORY OF PRESENT ILLNESS:   Isabel [name] has past medical history as below, which is significant for poorly controlled diabetes, Bipolar disorder, asthma/COPD, obstructive sleep apnea, hypertension, and GERD. Her diabetes is managed with our endocrinology, whom her caregiver contacted around 1 PM today 9/14 with complaints that the patient had been altered and laying on the floor since about 3 AM and her blood sugars have been reading as "high" on their home glucometer for the past 2 days. During that time. She had not been eating or drinking very much either. She was advised to go to the emergency department, EMS was contacted and [facility]. There, she was found to be profoundly hypertensive with leukocytosis. She was emergently intubated for airway protection. ABG taken at that time demonstrated profound metabolic acidosis with bicarbonate less than 7. PH 6.9. Urine was positive for ketones and she was started on insulin infusion, IV fluid resuscitation, and sodium bicarbonate infusion. She was also felt to possibly be septic and was started on broad-spectrum antibiotics. Source of possible infection not entirely clear at that point. Serial ABG samples did not demonstrate much improvement despite the aforementioned therapies. Based on lack of improvement and need for critical care admission she was [facility].     SUBJECTIVE:  Remains critically ill, intubated , on insulin gtt Off levo gtt Afebrile Good UO    VITAL SIGNS: BP 114/63   Pulse (!) 110   Temp 98.8 F (37.1 C) (Axillary)   Resp (!) 29   Ht 5\' 5"  (1.651 m)   Wt 155 lb 13.8 oz (70.7 kg)   SpO2 98%   BMI 25.94 kg/m   HEMODYNAMICS:     VENTILATOR SETTINGS: Vent Mode: PSV;CPAP FiO2 (%):  [30 %] 30 % Set Rate:  [32 bmp] 32 bmp Vt Set:  [500 mL] 500 mL PEEP:  [5 cmH20] 5 cmH20 Pressure Support:  [5 cmH20] 5 cmH20 Plateau Pressure:  [19 cmH20-21 cmH20] 21 cmH20  INTAKE / OUTPUT: I/O last 3 completed shifts: In: 5626.3 [I.V.:4616.3; IV Piggyback:1010] Out: 1964 [Urine:1664; Other:300]  PHYSICAL EXAMINATION: General:  Elderly female, appears older than stated age on ventilator Neuro:  Int agitation on fent gtt,  Pupils very sluggish HEENT:  [place]/AT, no JVD Cardiovascular:  RRR, no MRG Lungs:  Clear Abdomen:  Soft, non-tender, non-distended Musculoskeletal:  No acute deformity or ROM limitation Skin:  Rash to sternal chest and under chin -resolved Also excoriation to L inguinal fold.   LABS:  BMET  Recent Labs Lab 08/20/16 2316 08/21/16 0400 08/21/16 0745  NA 132* 133* 133*  K 4.5 4.0 3.6  CL 112* 113* 112*  CO2 14* 14* 14*  BUN 55* 52* 53*  CREATININE 1.37* 1.47* 1.39*  GLUCOSE 189* 156* 149*    Electrolytes  Recent Labs Lab 08/19/16 2250  08/20/16 1448  08/20/16 2316 08/21/16 0400 08/21/16 0745  CALCIUM 7.7*  < > 7.8*  < > 7.8* 8.0* 7.9*  MG 1.1*  --  1.7  --   --  2.0  --   PHOS 2.1*  --   --   --   --  1.2*  --   < > = values in this interval not displayed.  CBC  Recent Labs Lab 08/21/16 0400  WBC 16.1*  HGB 11.1*  HCT 29.9*  PLT 114*    Coag's No results for input(s): APTT, INR in the last 168 hours.  Sepsis Markers  Recent Labs Lab 08/19/16 2250 08/20/16 0140 08/20/16 0430 08/21/16 0400  LATICACIDVEN 2.1* 2.0*  --   --   PROCALCITON 0.92  --  1.83 1.67    ABG  Recent Labs Lab 08/19/16 2258  PHART 7.091*  PCO2ART 21.7*  PO2ART 117*    Liver Enzymes  Recent Labs Lab 08/19/16 2250 08/21/16 0400  AST 27  --   ALT 18  --   ALKPHOS 143*  --   BILITOT 1.6*  --   ALBUMIN 2.8* 2.3*    Cardiac Enzymes  Recent Labs Lab 08/19/16 2250 08/20/16 0430  08/20/16 1254  TROPONINI <0.03 0.08* 0.11*    Glucose  Recent Labs Lab 08/21/16 0318 08/21/16 0434 08/21/16 0538 08/21/16 0637 08/21/16 0745 08/21/16 0851  GLUCAP 169* 164* 141* 141* 152* 115*    Imaging Dg Chest Port 1 View  Result Date: 08/21/2016 CLINICAL DATA:  Acute respiratory failure EXAM: PORTABLE CHEST 1 VIEW COMPARISON:  08/19/2016 FINDINGS: Endotracheal tube and NG tube unchanged. Introduction of LEFT central venous line with tip in distal SVC. LEFT basilar atelectasis and scarring not changed. No pulmonary edema. No infiltrate. IMPRESSION: 1. Stable support apparatus. 2. Introduction of LEFT PICC line with tip in the distal SVC. 3. LEFT basilar atelectasis. Electronically Signed   By: Genevive Bi M.D.   On: 08/21/2016 07:22     STUDIES:    CULTURES: Blood Cultures Duke Salvia 9/14 >> Urine Cx Duke Salvia 9/14 >> MRSA PCR 9/14 >> positive  ANTIBIOTICS: Levaquin 9/14 >> Vancomycin 9/14 >>   SIGNIFICANT EVENTS: 9/14  Admit with DKA  LINES/TUBES: R femoral line 9/14 >>9/16 LUE PICC 9/16 >> ETT 9/14 >>  DISCUSSION: Much improved,DKA -resolved, shock - resolved Weans but agitation limits extubation  ASSESSMENT / PLAN:  PULMONARY A: Inability to protect airway secondary to profound acidemia in setting of DKA. COPD without acute exacerbation P:   SBTs with goal extubation soon Scheduled DuoNeb's, budesonide. PRN albuterol  CARDIOVASCULAR A:  Shock - likely in the setting of hypovolemia, but ddx includes sepsis. Also, less likely but consider anaphylaxis she has latex allergy and latex F[facility] was [facility] at [facility]. P: resolved   RENAL A:   Acute kidney injury - suspect secondary to dehydration, possible rhabdo with lying on floor Hyponatremia  High anion gap metabolic acidosis in the setting of ketonemia -resolved, now NAG acidosis P:   Dc IVF  Trend CK   GASTROINTESTINAL A:   Ventilator Dependent  P:    Pepcid for stress ulcer  prophylaxis start TF  HEMATOLOGIC A:   No acute issues P:  Follow CBC  Subcutaneous heparin for DVT prophylaxis   INFECTIOUS A:   Septic shock - source unclear.  L inguinal fold excoriation. P:   Trend WBC and fever curve  Pro-calcitonin  Antibiotics as above, low threshold to DC  Once pct lower Wound care consult.    ENDOCRINE A:   Diabetic ketoacidosis with history of insulin-dependent diabetes. P:   Insulin infusion to transition off after levemir 40 u SQ SSI   NEUROLOGIC A:   Acute metabolic encephalopathy in the setting of severe acidosis Bipolar disorder ? Dementia P:   RASS goal:  0 Fentanyl prn + start precedex gtt Resume lamictal   FAMILY  - Updates:  No  family at bedside am 9/16, cousin Rosann Auerbach de facto POA    - Inter-disciplinary family meet or Palliative Care meeting due by:   9/21  My cc time x 31m  Cyril Mourning MD. [facility] [place] [facility] Pager [phone] If no response call [phone]    08/21/2016, 10:20 AM

## 2016-08-21 NOTE — Progress Notes (Signed)
At 1800 [facility] from PS/CPAP mode and became extremely agitated trying to lean out of bed and grabbing ETT. Precedex increased temporarily until PRN fentanyl was given, then titrated back down. Next BP 70s/40s. Notified Elink of BP and awaiting response at this time.

## 2016-08-22 ENCOUNTER — Inpatient Hospital Stay (HOSPITAL_COMMUNITY): Payer: Medicare Other

## 2016-08-22 LAB — BASIC METABOLIC PANEL
Anion gap: 5 (ref 5–15)
BUN: 48 mg/dL — AB (ref 6–20)
CHLORIDE: 118 mmol/L — AB (ref 101–111)
CO2: 15 mmol/L — AB (ref 22–32)
CREATININE: 1.42 mg/dL — AB (ref 0.44–1.00)
Calcium: 8.4 mg/dL — ABNORMAL LOW (ref 8.9–10.3)
GFR calc Af Amer: 46 mL/min — ABNORMAL LOW (ref >60)
GFR calc non Af Amer: 40 mL/min — ABNORMAL LOW (ref >60)
Glucose, Bld: 181 mg/dL — ABNORMAL HIGH (ref 65–99)
POTASSIUM: 3.7 mmol/L (ref 3.5–5.1)
Sodium: 138 mmol/L (ref 135–145)

## 2016-08-22 LAB — GLUCOSE, CAPILLARY
GLUCOSE-CAPILLARY: 159 mg/dL — AB (ref 65–99)
Glucose-Capillary: 110 mg/dL — ABNORMAL HIGH (ref 65–99)
Glucose-Capillary: 111 mg/dL — ABNORMAL HIGH (ref 65–99)
Glucose-Capillary: 129 mg/dL — ABNORMAL HIGH (ref 65–99)
Glucose-Capillary: 150 mg/dL — ABNORMAL HIGH (ref 65–99)
Glucose-Capillary: 163 mg/dL — ABNORMAL HIGH (ref 65–99)

## 2016-08-22 LAB — CBC
HEMATOCRIT: 28.7 % — AB (ref 36.0–46.0)
Hemoglobin: 10.4 g/dL — ABNORMAL LOW (ref 12.0–15.0)
MCH: 26.9 pg (ref 26.0–34.0)
MCHC: 36.2 g/dL — ABNORMAL HIGH (ref 30.0–36.0)
MCV: 74.2 fL — AB (ref 78.0–100.0)
PLATELETS: 85 10*3/uL — AB (ref 150–400)
RBC: 3.87 MIL/uL (ref 3.87–5.11)
RDW: 16.5 % — AB (ref 11.5–15.5)
WBC: 18.5 10*3/uL — AB (ref 4.0–10.5)

## 2016-08-22 MED ORDER — GLUCERNA 1.2 CAL PO LIQD
1000.0000 mL | ORAL | Status: DC
  Administered 2016-08-22: 1000 mL
  Filled 2016-08-22 (×5): qty 1000

## 2016-08-22 MED ORDER — DEXMEDETOMIDINE HCL IN NACL 200 MCG/50ML IV SOLN
0.4000 ug/kg/h | INTRAVENOUS | Status: DC
  Administered 2016-08-22: 0.4 ug/kg/h via INTRAVENOUS
  Administered 2016-08-22: 0.6 ug/kg/h via INTRAVENOUS
  Filled 2016-08-22 (×2): qty 50

## 2016-08-22 MED ORDER — SODIUM CHLORIDE 0.45 % IV SOLN
INTRAVENOUS | Status: DC
  Administered 2016-08-22: 12:00:00 via INTRAVENOUS

## 2016-08-22 MED ORDER — SODIUM CHLORIDE 0.9 % IV SOLN
50.0000 ug/h | INTRAVENOUS | Status: DC
  Administered 2016-08-22: 50 ug/h via INTRAVENOUS
  Administered 2016-08-23: 150 ug/h via INTRAVENOUS
  Filled 2016-08-22 (×3): qty 50

## 2016-08-22 NOTE — Progress Notes (Signed)
[facility]oke with [facility] microbiology lab to obtain culture results prior to [facility].  9/14 Blood:1 of 2 bottles coag negative staph (preliminary) 9/14 Urinalysis: unremarkable (no culture done)  Loralee PacasErin Montanna Mcbain, PharmD, BCPS Pharmacy [phone] 08/22/2016 11:39 AM

## 2016-08-22 NOTE — Progress Notes (Signed)
eLink Physician-Brief Progress Note Patient Name: Isabel [name] Isabel [name] DOB: 28-Oct-1956 MRN: [id number]   Date of Service  08/22/2016  HPI/Events of Note  Patient started on precedex drip for agitation and restlessness but patient developed significant hypotension even at low rate.  eICU Interventions  Stop precedex drip  fentanyl drip     Intervention Category Minor Interventions: Agitation / anxiety - evaluation and management  Henry RusselSMITH, Patryk Conant, P 08/22/2016, 3:24 PM

## 2016-08-22 NOTE — Progress Notes (Signed)
PULMONARY / CRITICAL CARE MEDICINE   Name: Isabel [name] MRN: [id number] DOB: August 11, 1956    ADMISSION DATE:  08/19/2016 CONSULTATION DATE:  08/19/2016  REFERRING MD:  Dr. Epimenio Foot EDP  CHIEF COMPLAINT:  Unresponsive  HISTORY OF PRESENT ILLNESS:   Isabel [name] has past medical history as below, which is significant for poorly controlled diabetes, Bipolar disorder, asthma/COPD, obstructive sleep apnea, hypertension, and GERD. Her diabetes is managed with our endocrinology, whom her caregiver contacted around 1 PM today 9/14 with complaints that the patient had been altered and laying on the floor since about 3 AM and her blood sugars have been reading as "high" on their home glucometer for the past 2 days. During that time. She had not been eating or drinking very much either. She was advised to go to the emergency department, EMS was contacted and transported her to [facility] emergency room. There, she was found to be profoundly hypertensive with leukocytosis. She was emergently intubated for airway protection. ABG taken at that time demonstrated profound metabolic acidosis with bicarbonate less than 7. PH 6.9. Urine was positive for ketones and she was started on insulin infusion, IV fluid resuscitation, and sodium bicarbonate infusion. She was also felt to possibly be septic and was started on broad-spectrum antibiotics. Source of possible infection not entirely clear at that point. Serial ABG samples did not demonstrate much improvement despite the aforementioned therapies. Based on lack of improvement and need for critical care admission she was transferred to [facility] ICU.     SUBJECTIVE:  Remains critically ill, intubated ,  Int agitation off insulin gtt Off levo gtt Afebrile Good UO    VITAL SIGNS: BP (!) 150/78   Pulse (!) 123   Temp 98.1 F (36.7 C) (Axillary)   Resp 20   Ht 5\' 5"  (1.651 m)   Wt 69.7 kg (153 lb 10.6 oz)   SpO2 96%   BMI 25.57 kg/m    HEMODYNAMICS:    VENTILATOR SETTINGS: Vent Mode: PSV;CPAP FiO2 (%):  [30 %-40 %] 30 % Set Rate:  [32 bmp] 32 bmp Vt Set:  [500 mL] 500 mL PEEP:  [5 cmH20] 5 cmH20 Pressure Support:  [5 cmH20-8 cmH20] 5 cmH20 Plateau Pressure:  [20 cmH20-25 cmH20] 23 cmH20  INTAKE / OUTPUT: I/O last 3 completed shifts: In: 4638.6 [I.V.:3508.6; Other:60; NG/GT:320; IV Piggyback:750] Out: 3320 [Urine:3020; Other:300]  PHYSICAL EXAMINATION: General:  Elderly female, appears older than stated age on ventilator Neuro:  Int agitation on fent gtt, not directible, does not follow commands Pupils very sluggish HEENT:  [place]/AT, no JVD Cardiovascular:  RRR, no MRG Lungs:  Clear Abdomen:  Soft, non-tender, non-distended Musculoskeletal:  No acute deformity or ROM limitation Skin:  Rash to sternal chest and under chin -resolved Also excoriation to L inguinal fold.   LABS:  BMET  Recent Labs Lab 08/21/16 0400 08/21/16 0745 08/22/16 0415  NA 133* 133* 138  K 4.0 3.6 3.7  CL 113* 112* 118*  CO2 14* 14* 15*  BUN 52* 53* 48*  CREATININE 1.47* 1.39* 1.42*  GLUCOSE 156* 149* 181*    Electrolytes  Recent Labs Lab 08/19/16 2250  08/20/16 1448  08/21/16 0400 08/21/16 0745 08/22/16 0415  CALCIUM 7.7*  < > 7.8*  < > 8.0* 7.9* 8.4*  MG 1.1*  --  1.7  --  2.0  --   --   PHOS 2.1*  --   --   --  1.2*  --   --   < > =  values in this interval not displayed.  CBC  Recent Labs Lab 08/21/16 0400 08/22/16 0415  WBC 16.1* 18.5*  HGB 11.1* 10.4*  HCT 29.9* 28.7*  PLT 114* 85*    Coag's No results for input(s): APTT, INR in the last 168 hours.  Sepsis Markers  Recent Labs Lab 08/19/16 2250 08/20/16 0140 08/20/16 0430 08/21/16 0400  LATICACIDVEN 2.1* 2.0*  --   --   PROCALCITON 0.92  --  1.83 1.67    ABG  Recent Labs Lab 08/19/16 2258  PHART 7.091*  PCO2ART 21.7*  PO2ART 117*    Liver Enzymes  Recent Labs Lab 08/19/16 2250 08/21/16 0400  AST 27  --   ALT 18  --    ALKPHOS 143*  --   BILITOT 1.6*  --   ALBUMIN 2.8* 2.3*    Cardiac Enzymes  Recent Labs Lab 08/19/16 2250 08/20/16 0430 08/20/16 1254  TROPONINI <0.03 0.08* 0.11*    Glucose  Recent Labs Lab 08/21/16 1309 08/21/16 1638 08/21/16 1958 08/21/16 2331 08/22/16 0415 08/22/16 0749  GLUCAP 162* 143* 131* 195* 163* 159*    Imaging Dg Chest Port 1 View  Result Date: 08/22/2016 CLINICAL DATA:  60 y/o  F; acute respiratory failure. EXAM: PORTABLE CHEST 1 VIEW COMPARISON:  08/21/2016 chest radiograph. FINDINGS: Endotracheal tube is stable in position. Enteric tube tip below the field of view in the abdomen. Right upper quadrant surgical clips noted. Hazy opacification of left lung base probably represents atelectasis and is stable. No new focal consolidation, pneumothorax, or pleural effusion. No acute osseous abnormality. Left PICC line tip projects over lower SVC. IMPRESSION: Stable lines and tubes. Left basilar atelectasis. No significant interval change. Electronically Signed   By: Isabel [name] M.D.   On: 08/22/2016 04:36     STUDIES:    CULTURES: Blood Cultures Isabel [name] 9/14 >> Urine Cx Isabel [name] 9/14 >> MRSA PCR 9/14 >> positive  ANTIBIOTICS: Levaquin 9/14 >> Vancomycin 9/14 >>   SIGNIFICANT EVENTS: 9/14  Admit with DKA 9/16 off insulin gtt & levo  LINES/TUBES: R femoral line 9/14 >>9/16 LUE PICC 9/16 >> ETT 9/14 >>  DISCUSSION: Much improved,DKA -resolved, shock - resolved Weans but agitation & poor mental status limits extubation  ASSESSMENT / PLAN:  PULMONARY A: Inability to protect airway secondary to profound acidemia in setting of DKA. COPD without acute exacerbation P:   SBTs with goal extubation soon Scheduled DuoNeb's, budesonide. PRN albuterol  CARDIOVASCULAR A:  Shock - likely in the setting of hypovolemia, but ddx includes sepsis. Also, less likely but consider anaphylaxis she has latex allergy and latex [facility] was [facility] at  [facility]. P: resolved   RENAL A:   Acute kidney injury - suspect secondary to dehydration, possible rhabdo with lying on floor Hyponatremia  High anion gap metabolic acidosis in the setting of ketonemia -resolved, now NAG acidosis P:   1/2 NS @ 50 Trend CK   GASTROINTESTINAL A:   Ventilator Dependent  P:    Pepcid for stress ulcer prophylaxis ct TF  HEMATOLOGIC A:   No acute issues P:  Follow CBC  Subcutaneous heparin for DVT prophylaxis   INFECTIOUS A:   Septic shock - source unclear.  L inguinal fold excoriation. P:   Trend WBC and fever curve  Pro-calcitonin  Antibiotics as above, low threshold to DC  Once pct lower Wound care consult.    ENDOCRINE A:   Diabetic ketoacidosis with history of insulin-dependent diabetes. P:   Insulin infusion off after  levemir 40 u SQ SSI -resistant   NEUROLOGIC A:   Acute metabolic encephalopathy in the setting of severe acidosis Bipolar disorder ? Dementia P:   RASS goal:  0 Fentanyl prn +  precedex gtt Resume lamictal   FAMILY  - Updates:  No family at bedside am 9/16, cousin Rosann Auerbachrish de facto POA    - Inter-disciplinary family meet or Palliative Care meeting due by:   9/21  My cc time x 35 m  Cyril Mourningakesh Hargun Spurling MD. [facility]CCP. [place] [facility] Pager [phone] If no response call [phone]    08/22/2016, 10:59 AM

## 2016-08-22 NOTE — Progress Notes (Signed)
Nutrition Follow-up  INTERVENTION:   -Initiate Glucerna 1.2 @ 20 mL/hr and increase by 10 mL every 6 hours to reach goal rate of Glucerna 1.2 @ 60 mL/hr. -At goal rate, this regimen will provide 1728 kcal, 86 grams of protein, and 1159 mL free water.  - RD will follow-up 9/18.  NUTRITION DIAGNOSIS:   Inadequate oral intake related to inability to eat as evidenced by NPO status.  Ongoing.  GOAL:   Patient will meet greater than or equal to 90% of their needs  Progressing.  MONITOR:   Vent status, Labs, Weight trends, TF tolerance, I & O's  REASON FOR ASSESSMENT:   Consult Enteral/tube feeding initiation and management  ASSESSMENT:   60 year-old with past medical history significant for poorly controlled diabetes, bipolar disorder, asthma/COPD, obstructive sleep apnea, hypertension, and GERD. Her diabetes is managed with our endocrinology, whom her caregiver contacted around 1 PM today 9/14 with complaints that the patient had been altered and laying on the floor since about 3 AM and her blood sugars have been reading as "high" on their home glucometer for the past 2 days. During that time she had not been eating or drinking very much. EMS transported [facility] emergency room where she was found to be profoundly hypertensive with leukocytosis. She was emergently intubated for airway protection. ABG taken at that time demonstrated profound metabolic acidosis with bicarbonate less than 7. Based on lack of improvement and need for critical care admission [facility]  Patient currently receiving Vital HP @ 20 ml/hr, providing 480 kcal and 42g of protein. TF recommendations provided in initial assessment 9/15, RD to order according to recommendations.    Patient is currently intubated on ventilator support MV: 14.8 L/min Temp (24hrs), Avg:98.6 F (37 C), Min:98.1 F (36.7 C), Max:99.1 F (37.3 C)  Medications reviewed. Labs reviewed: CBGs:  159-163  Diet Order:  Diet NPO time specified  Skin:  Reviewed, no issues  Last BM:  PTA  Height:   Ht Readings from Last 1 Encounters:  08/19/16 5\' 5"  (1.651 m)    Weight:   Wt Readings from Last 1 Encounters:  08/22/16 153 lb 10.6 oz (69.7 kg)    Ideal Body Weight:  56.82 kg  BMI:  Body mass index is 25.57 kg/m.  Estimated Nutritional Needs:   Kcal:  1760  Protein:  78-97 grams (1.2-1.5 grams/kg)  Fluid:  1.8 L/day  EDUCATION NEEDS:   No education needs identified at this time  Tilda FrancoLindsey Abcde Oneil, MS, RD, LDN Pager: [phone] After Hours Pager: [phone]

## 2016-08-23 ENCOUNTER — Encounter (HOSPITAL_COMMUNITY): Payer: Self-pay

## 2016-08-23 ENCOUNTER — Inpatient Hospital Stay (HOSPITAL_COMMUNITY): Payer: Medicare Other

## 2016-08-23 DIAGNOSIS — G9340 Encephalopathy, unspecified: Secondary | ICD-10-CM

## 2016-08-23 DIAGNOSIS — J9600 Acute respiratory failure, unspecified whether with hypoxia or hypercapnia: Secondary | ICD-10-CM

## 2016-08-23 DIAGNOSIS — J9601 Acute respiratory failure with hypoxia: Secondary | ICD-10-CM

## 2016-08-23 LAB — GLUCOSE, CAPILLARY
GLUCOSE-CAPILLARY: 59 mg/dL — AB (ref 65–99)
GLUCOSE-CAPILLARY: 89 mg/dL (ref 65–99)
GLUCOSE-CAPILLARY: 173 mg/dL — AB (ref 65–99)
GLUCOSE-CAPILLARY: 222 mg/dL — AB (ref 65–99)
GLUCOSE-CAPILLARY: 316 mg/dL — AB (ref 65–99)
GLUCOSE-CAPILLARY: 220 mg/dL — AB (ref 65–99)
Glucose-Capillary: 53 mg/dL — ABNORMAL LOW (ref 65–99)
Glucose-Capillary: 40 mg/dL — CL (ref 65–99)
Glucose-Capillary: 91 mg/dL (ref 65–99)

## 2016-08-23 LAB — CBC
HEMATOCRIT: 26.8 % — AB (ref 36.0–46.0)
Hemoglobin: 9.7 g/dL — ABNORMAL LOW (ref 12.0–15.0)
MCH: 27.2 pg (ref 26.0–34.0)
MCHC: 36.2 g/dL — AB (ref 30.0–36.0)
MCV: 75.1 fL — AB (ref 78.0–100.0)
PLATELETS: 99 10*3/uL — AB (ref 150–400)
RBC: 3.57 MIL/uL — ABNORMAL LOW (ref 3.87–5.11)
RDW: 16.9 % — AB (ref 11.5–15.5)
WBC: 14.2 10*3/uL — AB (ref 4.0–10.5)

## 2016-08-23 LAB — BASIC METABOLIC PANEL
Anion gap: 5 (ref 5–15)
BUN: 35 mg/dL — AB (ref 6–20)
CHLORIDE: 118 mmol/L — AB (ref 101–111)
CO2: 18 mmol/L — ABNORMAL LOW (ref 22–32)
CREATININE: 1.24 mg/dL — AB (ref 0.44–1.00)
Calcium: 8.9 mg/dL (ref 8.9–10.3)
GFR calc Af Amer: 54 mL/min — ABNORMAL LOW (ref >60)
GFR calc non Af Amer: 47 mL/min — ABNORMAL LOW (ref >60)
GLUCOSE: 60 mg/dL — AB (ref 65–99)
Potassium: 3.5 mmol/L (ref 3.5–5.1)
SODIUM: 141 mmol/L (ref 135–145)

## 2016-08-23 LAB — PHOSPHORUS: Phosphorus: 1.5 mg/dL — ABNORMAL LOW (ref 2.5–4.6)

## 2016-08-23 LAB — MAGNESIUM: Magnesium: 1.6 mg/dL — ABNORMAL LOW (ref 1.7–2.4)

## 2016-08-23 MED ORDER — MAGNESIUM SULFATE 2 GM/50ML IV SOLN
2.0000 g | Freq: Once | INTRAVENOUS | Status: AC
  Administered 2016-08-23: 2 g via INTRAVENOUS
  Filled 2016-08-23: qty 50

## 2016-08-23 MED ORDER — LEVOFLOXACIN IN D5W 750 MG/150ML IV SOLN
750.0000 mg | INTRAVENOUS | Status: AC
Start: 2016-08-23 — End: 2016-08-27
  Administered 2016-08-23 – 2016-08-26 (×4): 750 mg via INTRAVENOUS
  Filled 2016-08-23 (×4): qty 150

## 2016-08-23 MED ORDER — PRO-STAT SUGAR FREE PO LIQD
30.0000 mL | Freq: Every day | ORAL | Status: DC
  Administered 2016-08-23: 30 mL
  Filled 2016-08-23: qty 30

## 2016-08-23 MED ORDER — PIVOT 1.5 CAL PO LIQD
1000.0000 mL | ORAL | Status: DC
  Administered 2016-08-23: 1000 mL
  Filled 2016-08-23 (×3): qty 1000

## 2016-08-23 MED ORDER — DEXTROSE 10 % IV SOLN
INTRAVENOUS | Status: DC
  Administered 2016-08-23: 15:00:00 via INTRAVENOUS
  Administered 2016-08-23: 75 mL via INTRAVENOUS
  Filled 2016-08-23 (×4): qty 1000

## 2016-08-23 MED ORDER — INSULIN ASPART 100 UNIT/ML ~~LOC~~ SOLN
0.0000 [IU] | SUBCUTANEOUS | Status: DC
  Administered 2016-08-23: 7 [IU] via SUBCUTANEOUS
  Administered 2016-08-23: 2 [IU] via SUBCUTANEOUS
  Administered 2016-08-23 (×2): 3 [IU] via SUBCUTANEOUS
  Administered 2016-08-24 (×2): 9 [IU] via SUBCUTANEOUS
  Administered 2016-08-24: 3 [IU] via SUBCUTANEOUS
  Administered 2016-08-24: 2 [IU] via SUBCUTANEOUS
  Administered 2016-08-24: 9 [IU] via SUBCUTANEOUS
  Administered 2016-08-25: 7 [IU] via SUBCUTANEOUS
  Administered 2016-08-25: 9 [IU] via SUBCUTANEOUS
  Administered 2016-08-25 – 2016-08-26 (×7): 3 [IU] via SUBCUTANEOUS
  Administered 2016-08-26: 7 [IU] via SUBCUTANEOUS
  Administered 2016-08-26: 3 [IU] via SUBCUTANEOUS
  Administered 2016-08-26: 9 [IU] via SUBCUTANEOUS
  Administered 2016-08-27: 3 [IU] via SUBCUTANEOUS
  Administered 2016-08-27: 5 [IU] via SUBCUTANEOUS
  Administered 2016-08-27: 3 [IU] via SUBCUTANEOUS

## 2016-08-23 MED ORDER — POTASSIUM PHOSPHATES 15 MMOLE/5ML IV SOLN
40.0000 meq | Freq: Once | INTRAVENOUS | Status: AC
  Administered 2016-08-23: 40 meq via INTRAVENOUS
  Filled 2016-08-23: qty 9.09

## 2016-08-23 MED ORDER — DEXTROSE 50 % IV SOLN
INTRAVENOUS | Status: AC
  Administered 2016-08-23: 25 mL
  Filled 2016-08-23: qty 50

## 2016-08-23 NOTE — Progress Notes (Signed)
PULMONARY / CRITICAL CARE MEDICINE   Name: Isabel [name] MRN: [id number] DOB: September 03, 1956    ADMISSION DATE:  08/19/2016 CONSULTATION DATE:  08/19/2016  REFERRING MD:  Dr. Epimenio Foot EDP  CHIEF COMPLAINT:  Unresponsive  HISTORY OF PRESENT ILLNESS:   Isabel [name] has past medical history as below, which is significant for poorly controlled diabetes, Bipolar disorder, asthma/COPD, obstructive sleep apnea, hypertension, and GERD. Her diabetes is managed with our endocrinology, whom her caregiver contacted around 1 PM today 9/14 with complaints that the patient had been altered and laying on the floor since about 3 AM and her blood sugars have been reading as "high" on their home glucometer for the past 2 days. During that time. She had not been eating or drinking very much either. She was advised to go to the emergency department, EMS was contacted and transported her to [facility] emergency room. There, she was found to be profoundly hypertensive with leukocytosis. She was emergently intubated for airway protection. ABG taken at that time demonstrated profound metabolic acidosis with bicarbonate less than 7. PH 6.9. Urine was positive for ketones and she was started on insulin infusion, IV fluid resuscitation, and sodium bicarbonate infusion. She was also felt to possibly be septic and was started on broad-spectrum antibiotics. Source of possible infection not entirely clear at that point. Serial ABG samples did not demonstrate much improvement despite the aforementioned therapies. Based on lack of improvement and need for critical care admission she was transferred to [facility] ICU.     SUBJECTIVE:  Hypoglycemia overnight, requiring D10 infusion, Hypo -mg, phos, K.  Weaning on PSV 10/5, 30%.  Fentanyl gtt at 250 mcg   VITAL SIGNS: BP (!) 176/102   Pulse (!) 104   Temp 99.1 F (37.3 C) (Oral)   Resp 17   Ht 5\' 5"  (1.651 m)   Wt 156 lb 8.4 oz (71 kg)   SpO2 100%   BMI 26.05 kg/m    HEMODYNAMICS:    VENTILATOR SETTINGS: Vent Mode: CPAP;PSV FiO2 (%):  [30 %] 30 % Set Rate:  [32 bmp] 32 bmp Vt Set:  [500 mL] 500 mL PEEP:  [5 cmH20] 5 cmH20 Pressure Support:  [5 cmH20-10 cmH20] 10 cmH20 Plateau Pressure:  [15 cmH20-21 cmH20] 21 cmH20  INTAKE / OUTPUT: I/O last 3 completed shifts: In: 4210 [I.V.:2590; NG/GT:920; IV Piggyback:700] Out: 3400 [Urine:3400]  PHYSICAL EXAMINATION: General:  Elderly female, appears older than stated age on ventilator Neuro:  Int agitation on fent gtt, not directible, does not follow commands, pupils sluggish HEENT:  [place]/AT, no JVD Cardiovascular:  RRR, no m/r/, ? gallop Lungs:  Even/non-labored, lungs bilaterally clear Abdomen:  Soft, non-tender, non-distended Musculoskeletal:  No acute deformity or ROM limitation Skin:  Rash to sternal chest and under chin -resolved,  excoriation to L inguinal fold.   LABS:  BMET  Recent Labs Lab 08/21/16 0745 08/22/16 0415 08/23/16 0545  NA 133* 138 141  K 3.6 3.7 3.5  CL 112* 118* 118*  CO2 14* 15* 18*  BUN 53* 48* 35*  CREATININE 1.39* 1.42* 1.24*  GLUCOSE 149* 181* 60*    Electrolytes  Recent Labs Lab 08/19/16 2250  08/20/16 1448  08/21/16 0400 08/21/16 0745 08/22/16 0415 08/23/16 0545  CALCIUM 7.7*  < > 7.8*  < > 8.0* 7.9* 8.4* 8.9  MG 1.1*  --  1.7  --  2.0  --   --  1.6*  PHOS 2.1*  --   --   --  1.2*  --   --  1.5*  < > = values in this interval not displayed.  CBC  Recent Labs Lab 08/21/16 0400 08/22/16 0415 08/23/16 0545  WBC 16.1* 18.5* 14.2*  HGB 11.1* 10.4* 9.7*  HCT 29.9* 28.7* 26.8*  PLT 114* 85* 99*    Coag's No results for input(s): APTT, INR in the last 168 hours.  Sepsis Markers  Recent Labs Lab 08/19/16 2250 08/20/16 0140 08/20/16 0430 08/21/16 0400  LATICACIDVEN 2.1* 2.0*  --   --   PROCALCITON 0.92  --  1.83 1.67    ABG  Recent Labs Lab 08/19/16 2258  PHART 7.091*  PCO2ART 21.7*  PO2ART 117*    Liver  Enzymes  Recent Labs Lab 08/19/16 2250 08/21/16 0400  AST 27  --   ALT 18  --   ALKPHOS 143*  --   BILITOT 1.6*  --   ALBUMIN 2.8* 2.3*    Cardiac Enzymes  Recent Labs Lab 08/19/16 2250 08/20/16 0430 08/20/16 1254  TROPONINI <0.03 0.08* 0.11*    Glucose  Recent Labs Lab 08/22/16 2339 08/23/16 0340 08/23/16 0342 08/23/16 0437 08/23/16 0635 08/23/16 0734  GLUCAP 129* 59* 53* 89 40* 91    Imaging Dg Chest Port 1 View  Result Date: 08/23/2016 CLINICAL DATA:  Hypoxia. EXAM: PORTABLE CHEST 1 VIEW COMPARISON:  08/22/2016. FINDINGS: Endotracheal tube, NG tube, left PICC line stable position. Heart size stable. Left lower lobe infiltrate suggesting pneumonia noted. Low lung volumes with bibasilar atelectasis. No pleural effusion or pneumothorax. IMPRESSION: 1. Lines and tubes in stable position. 2. Left lower lobe infiltrate consistent with pneumonia. 3. Low lung volumes with bibasilar atelectasis. Electronically Signed   By: Maisie Fushomas  Register   On: 08/23/2016 06:41     STUDIES:    CULTURES: Blood Cultures [facility] 9/14 >> 1/2 coag neg staph (prelim) >>  Urine Cx [facility] 9/14 >> not done MRSA PCR 9/14 >> positive  ANTIBIOTICS: Levaquin 9/14 >> Vancomycin 9/14 >>   SIGNIFICANT EVENTS: 9/14  Admit with DKA 9/16  off insulin gtt & levo 9/17  Remains critically ill, intubated, int agitation, off levo, afebrile  LINES/TUBES: R femoral line 9/14 >> 9/16 LUE PICC 9/16 >> ETT 9/14 >>  DISCUSSION: Much improved, DKA -resolved, shock - resolved.  Weans but agitation & poor mental status limits extubation  ASSESSMENT / PLAN:  PULMONARY A: Inability to protect airway secondary to profound acidemia in setting of DKA. LLL Airspace Disease COPD without acute exacerbation P:   PRVC 8 cc/kg  Wean PEEP / FiO2 for sats > 92% Daily SBT / WUA Intermittent CXR Scheduled DuoNeb's, budesonide. PRN albuterol See ID  CARDIOVASCULAR A:  Shock - likely in the setting  of hypovolemia, but ddx includes sepsis. Also, less likely but consider anaphylaxis she has latex allergy and latex [facility]. P:  ICU monitoring of hemodynamics   RENAL A:   Acute kidney injury - suspect secondary to dehydration, possible rhabdo with lying on floor Hyponatremia  Hypomagnesemia  Hypokalemia High anion gap metabolic acidosis in the setting of ketonemia - resolved, now NAG acidosis-resolved.  P:   D10 @ 75 ml/hr in setting of hypoglycemia  Trend BMP / UOP  Replace electrolytes as indicated, mg, Kphos 9/18  GASTROINTESTINAL A:   Ventilator Dependent  P:   Pepcid for stress ulcer prophylaxis Continue TF per Nutrition  HEMATOLOGIC A:   Thrombocytopenia  P:  Follow CBC  Subcutaneous heparin for DVT prophylaxis  INFECTIOUS A:   Septic shock - ? LLL infiltrate, noted on admit.  L inguinal fold excoriation. P:   Trend WBC and fever curve  Pro-calcitonin  Antibiotics as above Wound care consult.   ENDOCRINE A:   Diabetic ketoacidosis with history of insulin-dependent diabetes. Hypoglycemia - overnight 9/17-9/18 P:   SSI - sensitive scale D10 infusion at 17ml/hr  Monitor glucose   NEUROLOGIC A:   Acute metabolic encephalopathy in the setting of severe acidosis Bipolar disorder ? Dementia P:   RASS goal:  0 Fentanyl gtt for pain / sedation  Continue lamictal   FAMILY  - Updates:  No family at bedside am 9/18.  Cousin Trish de facto POA    - Inter-disciplinary family meet or Palliative Care meeting due by:   9/21   Canary Brim, NP-C [place] [facility] Pgr: [phone] or if no answer [phone] 08/23/2016, 8:22 AM

## 2016-08-23 NOTE — Progress Notes (Signed)
Hypoglycemic Event  CBG: 40  Treatment: D50 IV 25 mL  Symptoms: None  Follow-up CBG: Time: 0735 CBG Result: 91  Possible Reasons for Event: Unknown  Comments/MD notified: Patient stared on D10 infusion/ Dr. Belva CromeP. Mannam    Donjuan Robison, Lilia ProQueeneth Chibuzo

## 2016-08-23 NOTE — Progress Notes (Signed)
eLink Physician-Brief Progress Note Patient Name: Isabel [name] Isabel Kirshner DOB: 1956-11-05 MRN: [id number]   Date of Service  08/23/2016  HPI/Events of Note  Repeat blood sugar- 40  eICU Interventions  Start dextrose drip        Evelyn Aguinaldo 08/23/2016, 6:41 AM

## 2016-08-23 NOTE — Progress Notes (Signed)
Nutrition Follow-up  DOCUMENTATION CODES:   Not applicable  INTERVENTION:  - Will change TF order: Pivot 1.5 @ 30 mL/hr with 30 mL Prostat once/day. This regimen + kcal from IVF will provide 1792 kcal (107% estimated kcal need), 83 grams of protein, and 546 mL free water. (Pivot 1.5 @ 30 mL/hr with 30 mL Prostat also provides 134 grams carbohydrate, 770 mg Phos, and 288 mg Mg).   - Continue PEPuP protocol. - Free water flush per MD/NP order, if desired, as weight continues to trend up. - RD will follow-up 9/19.  NUTRITION DIAGNOSIS:   Inadequate oral intake related to inability to eat as evidenced by NPO status. -ongoing  GOAL:   Patient will meet greater than or equal to 90% of their needs -met with current regimen.  MONITOR:   Vent status, TF tolerance, Weight trends, Labs, I & O's  ASSESSMENT:   60 year-old with past medical history significant for poorly controlled diabetes, bipolar disorder, asthma/COPD, obstructive sleep apnea, hypertension, and GERD. Her diabetes is managed with our endocrinology, whom her caregiver contacted around 1 PM today 9/14 with complaints that the patient had been altered and laying on the floor since about 3 AM and her blood sugars have been reading as "high" on their home glucometer for the past 2 days. During that time she had not been eating or drinking very much. EMS transported her to [facility] emergency room where she was found to be profoundly hypertensive with leukocytosis. She was emergently intubated for airway protection. ABG taken at that time demonstrated profound metabolic acidosis with bicarbonate less than 7. Based on lack of improvement and need for critical care admission she was transferred to [facility] ICU.  9/18 Pt with OGT and currently receiving TF at goal rate: Glucerna 1.2 @ 60 mL/hr with no free water flush ordered. This regimen is providing 1728 kcal, 86 grams of protein, and 1159 mL free water. TF is providing 103% of  estimated kcal need. Estimated kcal need adjusted and based on admission weight (62.4 kg) as weight has been trending up since that time and is now +8.6 kg from admission.  Patient is currently intubated on ventilator support MV: 16 L/min Temp (24hrs), Avg:98.5 F (36.9 C), Min:97.8 F (36.6 C), Max:99.2 F (37.3 C) Propofol: none  Will change TF regimen as outlined above based on hypoglycemia, hypomagnesemia, and hypophosphatemia. RD will follow-up 9/19.  Medications reviewed; sliding scale Novolog, 2 g IV Mg sulfate x1 dose today, 40 mEq IV KPhos x1 dose today.  Labs reviewed; CBGs: 40-173 mg/dL, Cl: 118 mg/dL, BUN: 35 mg/dL, creatinine: 1.24 mg/dL, Phos: 1.5 mg/dL, Mg: 1.5 mg/dL, GFR: 47 mL/min.   IVF: D10 @ 75 mL/hr (612 kcal). Drip: Fentanyl @ 150 mcg/hr.      9/17 - Patient currently receiving Vital HP @ 20 ml/hr, providing 480 kcal and 42g of protein.  - TF recommendations provided in initial assessment 9/15, RD to order according to recommendations.    Patient is currently intubated on ventilator support MV: 14.8 L/min Temp (24hrs), Avg:98.6 F (37 C), Max:99.1 F (37.3 C)   9/15 - Pt with OGT to suction with ~100cc dark brown drainage present.  - No family/visitors present at this and per rounds, no family/visitors present earlier this shift.  - Physical assessment shows no muscle or fat wasting.  - Per chart review, weight mainly stable/trending up since March 2017 with CBW +9 lbs since March and +2 lbs since June.  - TF recommendations: Glucerna  1.2 @ 20 mL/hr and increase by 10 mL every 6 hours to reach goal rate of Glucerna 1.2 @ 60 mL/hr. At goal rate, this regimen will provide 1728 kcal, 86 grams of protein, and 1159 mL free water.   Patient is currently intubated on ventilator support MV: 16.4 L/min Temp (24hrs), Avg:97.9 F (36.6 C), Max:99.7 F (37.6 C)  IVF: NS @ 100 mL/hr.  Drips: Levo @ 6 mcg/min, Fentanyl @ 50 mcg/hr, Insulin @ 27.5 units/hr.       Diet Order:  Diet NPO time specified  Skin:  Reviewed, no issues  Last BM:  9/17  Height:   Ht Readings from Last 1 Encounters:  08/19/16 5' 5"  (1.651 m)    Weight:   Wt Readings from Last 1 Encounters:  08/23/16 156 lb 8.4 oz (71 kg)    Ideal Body Weight:  56.82 kg  BMI:  Body mass index is 26.05 kg/m.  Estimated Nutritional Needs:   Kcal:  9643  Protein:  75-94 grams (1.2-1.5 grams/kg)  Fluid:  per MD  EDUCATION NEEDS:   No education needs identified at this time    Jarome Matin, MS, RD, LDN Inpatient Clinical Dietitian Pager # [phone] After hours/weekend pager # [phone]

## 2016-08-23 NOTE — Progress Notes (Signed)
Pharmacy Antibiotic Note  Isabel [name] is a 60 y.o. female admitted on 08/19/2016 with sepsis.  Pharmacy has been consulted for vancomycin, levofloxacin dosing.  Today, 08/23/2016 Day #4 antibiotics - Renal: slowly improving - WBC: better overnight - afebrile  Plan:  Vancomycin 1gm IV every 24 hours.  Goal trough 15-20 mcg/mL.   check vancomycin level today for improved renal function.  Anticipate will need to change to q12h  Based on SCr trend and borderline CrCl, change Levofloxacin 750mg  IV to q24hr  Height: 5\' 5"  (165.1 cm) Weight: 156 lb 8.4 oz (71 kg) IBW/kg (Calculated) : 57  Temp (24hrs), Avg:98.6 F (37 C), Min:97.8 F (36.6 C), Max:99.5 F (37.5 C)   Recent Labs Lab 08/19/16 2250 08/20/16 0140  08/20/16 2316 08/21/16 0400 08/21/16 0745 08/22/16 0415 08/23/16 0545  WBC  --   --   --   --  16.1*  --  18.5* 14.2*  CREATININE 1.57*  --   < > 1.37* 1.47* 1.39* 1.42* 1.24*  LATICACIDVEN 2.1* 2.0*  --   --   --   --   --   --   < > = values in this interval not displayed.  Estimated Creatinine Clearance: 48.9 mL/min (by C-G formula based on SCr of 1.24 mg/dL (H)).    Allergies  Allergen Reactions  . Aspirin Hives, Shortness Of Breath, Itching and Rash    REACTION: unspecified  . Latex Hives, Shortness Of Breath and Rash    rash  . Penicillins Hives and Shortness Of Breath    Has patient had a PCN reaction causing immediate rash, facial/tongue/throat swelling, SOB or lightheadedness with hypotension: yes Has patient had a PCN reaction causing severe rash involving mucus membranes or skin necrosis: no Has patient had a PCN reaction that required hospitalization : yes Has patient had a PCN reaction occurring within the last 10 years: yes, 6 years ago If all of the above answers are "NO", then may proceed with Cephalosporin use.   . Sulfamethoxazole Hives and Shortness Of Breath    REACTION: unspecified  . Metformin And Related Hives and Diarrhea   combative  . Iodine Hives and Rash  . Povidone-Iodine Hives, Itching and Rash    REACTION: unspecified    Antimicrobials this admission: Vancomycin 08/19/2016 >> Levofloxacin 08/19/2016 >>   Dose adjustments this admission: 9/18 21:30 VT = __mcg/ml on 1gm IV q24h (prior to 5th dose)  Microbiology results:  9/14 BCx [facility]([place] [phone]): 1 of 2 CoNS 9/14 UA [facility]([place]): unremarkable 9/14 Urine strep/legionella: neg/pending 9/14 MRSA PCR: (+)   Thank you for allowing pharmacy to be a part of this patient's care.  Juliette Alcideustin Sarie Stall, PharmD, BCPS.   Pager: [phone] 08/23/2016 11:46 AM

## 2016-08-23 NOTE — Progress Notes (Signed)
Hypoglycemic Event  CBG: 53  Treatment: D50 IV 25 mL  Symptoms: None  Follow-up CBG: Time: 0437 CBG Result: 89 Possible Reasons for Event: Unknown  Comments/MD notified: New order received /Dr. Demetrius CharityP. Mannam    Isabel [name], Isabel [name]

## 2016-08-23 NOTE — Progress Notes (Signed)
Inpatient Diabetes Program Recommendations  AACE/ADA: New Consensus Statement on Inpatient Glycemic Control (2015)  Target Ranges:  Prepandial:   less than 140 mg/dL      Peak postprandial:   less than 180 mg/dL (1-2 hours)      Critically ill patients:  140 - 180 mg/dL   Lab Results  Component Value Date   GLUCAP 173 (H) 08/23/2016   HGBA1C 11.2 (H) 08/19/2016   Results for Isabel [name], [name] (MRN [id number]) as of 08/23/2016 11:47  Ref. Range 08/22/2016 23:39 08/23/2016 03:40 08/23/2016 03:42 08/23/2016 04:37 08/23/2016 06:35 08/23/2016 07:34 08/23/2016 11:30  Glucose-Capillary Latest Ref Range: 65 - 99 mg/dL 409129 (H) 59 (L) 53 (L) 89 40 (LL) 91 173 (H)   Review of Glycemic Control  Hypoglycemia this am - 40 mg/dL. Continues on TFs. Needs less Levemir and TF coverage of CHOs. ICU Glycemic Control Protocol ideal with this pt.  Inpatient Diabetes Program Recommendations:    ICU Glycemic Control Protocol. If not protocol: Levemir 20 units Q24H Novolog 3 units Q4H for TF coverage.  Will continue to follow. Thank you. Ailene Ardshonda Anjani Feuerborn, RD, LDN, CDE Inpatient Diabetes Coordinator [phone]

## 2016-08-23 NOTE — Progress Notes (Signed)
Date:  August 23, 2016 Chart reviewed for concurrent status and case management needs. Will continue to follow the patient for status change: vent support Discharge Planning: following for needs Expected discharge date: [id number] Marcelle SmilingRhonda Davis, BSN, [place]3, [place]M   [phone]

## 2016-08-23 NOTE — Progress Notes (Signed)
eLink Physician-Brief Progress Note Patient Name: Isabel [name] Isabel Gruetzmacher DOB: 31-Mar-1956 MRN: [id number]   Date of Service  08/23/2016  HPI/Events of Note  Hypoglycemia.  BS 55  eICU Interventions  1/2 amp D50 Change SSI scale to sensitive        Aragon Scarantino 08/23/2016, 5:08 AM

## 2016-08-24 ENCOUNTER — Telehealth: Payer: Self-pay | Admitting: Endocrinology

## 2016-08-24 ENCOUNTER — Telehealth: Payer: Self-pay | Admitting: Family Medicine

## 2016-08-24 ENCOUNTER — Inpatient Hospital Stay (HOSPITAL_COMMUNITY): Payer: Medicare Other

## 2016-08-24 DIAGNOSIS — J9601 Acute respiratory failure with hypoxia: Secondary | ICD-10-CM

## 2016-08-24 LAB — BASIC METABOLIC PANEL
Anion gap: 8 (ref 5–15)
BUN: 29 mg/dL — AB (ref 6–20)
CHLORIDE: 109 mmol/L (ref 101–111)
CO2: 18 mmol/L — AB (ref 22–32)
CREATININE: 1.25 mg/dL — AB (ref 0.44–1.00)
Calcium: 8.2 mg/dL — ABNORMAL LOW (ref 8.9–10.3)
GFR calc Af Amer: 54 mL/min — ABNORMAL LOW (ref >60)
GFR calc non Af Amer: 46 mL/min — ABNORMAL LOW (ref >60)
Glucose, Bld: 431 mg/dL — ABNORMAL HIGH (ref 65–99)
Potassium: 4 mmol/L (ref 3.5–5.1)
SODIUM: 135 mmol/L (ref 135–145)

## 2016-08-24 LAB — GLUCOSE, CAPILLARY
GLUCOSE-CAPILLARY: 351 mg/dL — AB (ref 65–99)
GLUCOSE-CAPILLARY: 382 mg/dL — AB (ref 65–99)
Glucose-Capillary: 378 mg/dL — ABNORMAL HIGH (ref 65–99)
Glucose-Capillary: 231 mg/dL — ABNORMAL HIGH (ref 65–99)
Glucose-Capillary: 418 mg/dL — ABNORMAL HIGH (ref 65–99)
Glucose-Capillary: 195 mg/dL — ABNORMAL HIGH (ref 65–99)

## 2016-08-24 LAB — PROCALCITONIN: PROCALCITONIN: 0.2 ng/mL

## 2016-08-24 LAB — CBC
HEMATOCRIT: 26.4 % — AB (ref 36.0–46.0)
HEMOGLOBIN: 9.4 g/dL — AB (ref 12.0–15.0)
MCH: 26.6 pg (ref 26.0–34.0)
MCHC: 35.6 g/dL (ref 30.0–36.0)
MCV: 74.8 fL — ABNORMAL LOW (ref 78.0–100.0)
Platelets: 101 10*3/uL — ABNORMAL LOW (ref 150–400)
RBC: 3.53 MIL/uL — AB (ref 3.87–5.11)
RDW: 17.3 % — ABNORMAL HIGH (ref 11.5–15.5)
WBC: 8.6 10*3/uL (ref 4.0–10.5)

## 2016-08-24 LAB — GLUCOSE, RANDOM: Glucose, Bld: 372 mg/dL — ABNORMAL HIGH (ref 65–99)

## 2016-08-24 LAB — MAGNESIUM: MAGNESIUM: 1.5 mg/dL — AB (ref 1.7–2.4)

## 2016-08-24 LAB — PHOSPHORUS: PHOSPHORUS: 3.1 mg/dL (ref 2.5–4.6)

## 2016-08-24 MED ORDER — LORAZEPAM 2 MG/ML IJ SOLN
1.0000 mg | INTRAMUSCULAR | Status: DC | PRN
Start: 2016-08-24 — End: 2016-08-27
  Administered 2016-08-24 – 2016-08-27 (×5): 1 mg via INTRAVENOUS
  Filled 2016-08-24 (×5): qty 1

## 2016-08-24 MED ORDER — MAGNESIUM SULFATE 2 GM/50ML IV SOLN
2.0000 g | Freq: Once | INTRAVENOUS | Status: AC
  Administered 2016-08-24: 2 g via INTRAVENOUS
  Filled 2016-08-24: qty 50

## 2016-08-24 MED ORDER — LORAZEPAM 0.5 MG PO TABS
0.2500 mg | ORAL_TABLET | Freq: Two times a day (BID) | ORAL | Status: DC
  Administered 2016-08-24: 0.25 mg
  Filled 2016-08-24: qty 1

## 2016-08-24 MED ORDER — SODIUM CHLORIDE 0.9 % IV SOLN
INTRAVENOUS | Status: DC
  Administered 2016-08-24 – 2016-08-26 (×3): via INTRAVENOUS

## 2016-08-24 MED ORDER — PIVOT 1.5 CAL PO LIQD
1000.0000 mL | ORAL | Status: DC
  Filled 2016-08-24: qty 1000

## 2016-08-24 MED ORDER — PROPOFOL 1000 MG/100ML IV EMUL
5.0000 ug/kg/min | INTRAVENOUS | Status: DC
  Administered 2016-08-24: 5 ug/kg/min via INTRAVENOUS
  Filled 2016-08-24: qty 100

## 2016-08-24 MED ORDER — FENTANYL CITRATE (PF) 100 MCG/2ML IJ SOLN
50.0000 ug | INTRAMUSCULAR | Status: DC | PRN
  Administered 2016-08-25: 50 ug via INTRAVENOUS
  Filled 2016-08-24: qty 2

## 2016-08-24 MED ORDER — VENLAFAXINE HCL 75 MG PO TABS
75.0000 mg | ORAL_TABLET | Freq: Two times a day (BID) | ORAL | Status: DC
  Administered 2016-08-24 – 2016-08-27 (×4): 75 mg
  Filled 2016-08-24 (×8): qty 1

## 2016-08-24 MED ORDER — IMIPRAMINE HCL 50 MG PO TABS
50.0000 mg | ORAL_TABLET | Freq: Every day | ORAL | Status: DC
  Administered 2016-08-26: 50 mg via ORAL
  Filled 2016-08-24 (×3): qty 1

## 2016-08-24 NOTE — Progress Notes (Addendum)
PULMONARY / CRITICAL CARE MEDICINE   Name: Isabel [name] MRN: [id number] DOB: 1956-02-10    ADMISSION DATE:  08/19/2016 CONSULTATION DATE:  08/19/2016  REFERRING MD:  Dr. Epimenio FootSnyder [name] EDP  CHIEF COMPLAINT:  Unresponsive  HISTORY OF PRESENT ILLNESS:   Mrs Isabel [name] has past medical history as below, which is significant for poorly controlled diabetes, Bipolar disorder, asthma/COPD, obstructive sleep apnea, hypertension, and GERD. Her diabetes is managed with our endocrinology, whom her caregiver contacted around 1 PM today 9/14 with complaints that the patient had been altered and laying on the floor since about 3 AM and her blood sugars have been reading as "high" on their home glucometer for the past 2 days. During that time. She had not been eating or drinking very much either. She was advised to go to the emergency department, EMS was contacted and [facility]. There, she was found to be profoundly hypertensive with leukocytosis. She was emergently intubated for airway protection. ABG taken at that time demonstrated profound metabolic acidosis with bicarbonate less than 7. PH 6.9. Urine was positive for ketones and she was started on insulin infusion, IV fluid resuscitation, and sodium bicarbonate infusion. She was also felt to possibly be septic and was started on broad-spectrum antibiotics. Source of possible infection not entirely clear at that point. Serial ABG samples did not demonstrate much improvement despite the aforementioned therapies. Based on lack of improvement and need for critical care admission she was transferred [facility].     SUBJECTIVE:   Hyperglycemia early am, D10 infusion stopped.  Mg replaced.  PCT 0.20  VITAL SIGNS: BP (!) 167/102 (BP Location: Right Arm)   Pulse (!) 110   Temp 98.8 F (37.1 C) (Axillary)   Resp 18   Ht 5\' 5"  (1.651 m)   Wt 153 lb 14.1 oz (69.8 kg)   SpO2 98%   BMI 25.61 kg/m   HEMODYNAMICS:     VENTILATOR SETTINGS: Vent Mode: CPAP;PSV FiO2 (%):  [30 %] 30 % Set Rate:  [32 bmp] 32 bmp Vt Set:  [500 mL] 500 mL PEEP:  [5 cmH20] 5 cmH20 Pressure Support:  [5 cmH20-10 cmH20] 10 cmH20 Plateau Pressure:  [20 cmH20-23 cmH20] 23 cmH20  INTAKE / OUTPUT: I/O last 3 completed shifts: In: 4390.6 [I.V.:2603.8; Other:130; NG/GT:1006.8; IV Piggyback:650] Out: 6335 [Urine:6335]  PHYSICAL EXAMINATION: General:  Adult female, appears older than stated age, on ventilator Neuro:  Intermittent agitation, not directible, does not follow commands but moves spontaneously  HEENT:  [place]/AT, no JVD Cardiovascular:  RRR, no m/r/g Lungs:  Even/non-labored, lungs bilaterally clear Abdomen:  Soft, non-tender, non-distended Musculoskeletal:  No acute deformity or ROM limitation Skin:  Rash to sternal chest and under chin -resolved,  excoriation to L inguinal fold.   LABS:  BMET  Recent Labs Lab 08/22/16 0415 08/23/16 0545 08/24/16 0529  NA 138 141 135  K 3.7 3.5 4.0  CL 118* 118* 109  CO2 15* 18* 18*  BUN 48* 35* 29*  CREATININE 1.42* 1.24* 1.25*  GLUCOSE 181* 60* 431*    Electrolytes  Recent Labs Lab 08/21/16 0400  08/22/16 0415 08/23/16 0545 08/24/16 0529  CALCIUM 8.0*  < > 8.4* 8.9 8.2*  MG 2.0  --   --  1.6* 1.5*  PHOS 1.2*  --   --  1.5* 3.1  < > = values in this interval not displayed.  CBC  Recent Labs Lab 08/22/16 0415 08/23/16 0545 08/24/16 0529  WBC 18.5*  14.2* 8.6  HGB 10.4* 9.7* 9.4*  HCT 28.7* 26.8* 26.4*  PLT 85* 99* 101*    Coag's No results for input(s): APTT, INR in the last 168 hours.  Sepsis Markers  Recent Labs Lab 08/19/16 2250 08/20/16 0140 08/20/16 0430 08/21/16 0400 08/24/16 0529  LATICACIDVEN 2.1* 2.0*  --   --   --   PROCALCITON 0.92  --  1.83 1.67 0.20    ABG  Recent Labs Lab 08/19/16 2258  PHART 7.091*  PCO2ART 21.7*  PO2ART 117*    Liver Enzymes  Recent Labs Lab 08/19/16 2250 08/21/16 0400  AST 27  --   ALT  18  --   ALKPHOS 143*  --   BILITOT 1.6*  --   ALBUMIN 2.8* 2.3*    Cardiac Enzymes  Recent Labs Lab 08/19/16 2250 08/20/16 0430 08/20/16 1254  TROPONINI <0.03 0.08* 0.11*    Glucose  Recent Labs Lab 08/23/16 1130 08/23/16 1544 08/23/16 1930 08/23/16 2320 08/24/16 0414 08/24/16 0735  GLUCAP 173* 222* 316* 220* 378* 382*    Imaging Dg Chest Port 1 View  Result Date: 08/24/2016 CLINICAL DATA:  Respiratory failure EXAM: PORTABLE CHEST 1 VIEW COMPARISON:  08/23/2016 FINDINGS: Endotracheal tube in good position. Left arm PICC tip at the cavoatrial junction unchanged. NG tube in the stomach. Patchy bibasilar airspace disease left greater than right is unchanged. No edema or effusion. IMPRESSION: Endotracheal tube remains in good position. Bibasilar airspace disease unchanged. Electronically Signed   By: Marlan Palau M.D.   On: 08/24/2016 07:15     STUDIES:    CULTURES: Blood Cultures Isabel [name] 9/14 >> 1/2 coag neg staph (prelim) >>  Urine Cx Isabel [name] 9/14 >> not done MRSA PCR 9/14 >> positive  ANTIBIOTICS: Levaquin 9/14 >> Vancomycin 9/14 >> 9/19  SIGNIFICANT EVENTS: 9/14  Admit with DKA 9/16  off insulin gtt & levo 9/17  Remains critically ill, intubated, int agitation, off levo, afebrile  LINES/TUBES: R femoral line 9/14 >> 9/16 LUE PICC 9/16 >>  ETT 9/14 >>  DISCUSSION: Much improved, DKA -resolved, shock - resolved.  Weans but agitation & poor mental status limits extubation  ASSESSMENT / PLAN:  PULMONARY A: Inability to protect airway secondary to profound acidemia in setting of DKA. LLL Airspace Disease COPD without acute exacerbation P:   PRVC 8 cc/kg  Wean PEEP / FiO2 for sats > 92% Daily SBT / WUA Intermittent CXR Scheduled DuoNeb's, budesonide. PRN albuterol See ID  CARDIOVASCULAR A:  Shock - likely in the setting of hypovolemia, but ddx includes sepsis. Also, less likely but consider anaphylaxis she has latex allergy and latex Foley  was placed at [facility]. P:  ICU monitoring of hemodynamics   RENAL A:   Acute kidney injury - suspect secondary to dehydration, possible rhabdo with lying on floor Hyponatremia  Hypomagnesemia  Hypokalemia High anion gap metabolic acidosis in the setting of ketonemia - resolved, now NAG acidosis-resolved.  P:   Decrease IVF to [facility]   Trend BMP / UOP  Replace electrolytes as indicated, mg, 9/19  GASTROINTESTINAL A:   Ventilator Dependent  P:   Pepcid for stress ulcer prophylaxis Continue TF per Nutrition  HEMATOLOGIC A:   Thrombocytopenia  P:  Follow CBC  Subcutaneous heparin for DVT prophylaxis   INFECTIOUS A:   Septic shock - ? LLL infiltrate, noted on admit.  L inguinal fold excoriation. P:   Trend WBC and fever curve  Trend PCT, improved / d/c vancomycin as above, 7 days  total levaquin & then d/c Antibiotics as above Wound care consult.   ENDOCRINE A:   Diabetic ketoacidosis with history of insulin-dependent diabetes. Hypoglycemia - overnight 9/17-9/18 P:   SSI - sensitive scale 1/2NS @ KVO  Monitor glucose   NEUROLOGIC A:   Acute metabolic encephalopathy in the setting of severe acidosis Bipolar disorder ? Dementia - unclear baseline mental status / functional level P:   RASS goal:  0 Fentanyl gtt for pain  Propofol low dose for sedation  Resume home Pristiq (formulary substitute of effexor) Baseline 5mg  valium use Q8 PRN.  Add 0.25 mg BID ativan to aide with IV gtt weaning & return to baseline med routine Continue lamictal   FAMILY  - Updates:  No family at bedside am 9/19.  Cousin Trish de facto POA.  Called 9/19 for update and no answer [phone]), unable to leave message.    - Inter-disciplinary family meet or Palliative Care meeting due by:   9/21   Canary Brim, NP-C [facility] Pgr: [phone] or if no answer [phone] 08/24/2016, 8:28 AM  Remains intermittently agitated, but did well with  SBT.  Opens eyes with stimulation.  No wheeze. HR regular.  Abd soft.  Hb 9.4, WBC 8.6, Creatinine 1.25.  CXR - basilar ATX  Assessment/plan:  Acute respiratory failure from DKA, PNA, COPD. - proceed with extubation trial - finish course of Abx  DKA. - improved - SSI  Bipolar with reported baseline dementia > not sure about baseline mental function. - monitor after extubation  CC time by me independent of APP time 31 minutes.  Coralyn Helling, MD [facility] 08/24/2016, 1:02 PM Pager:  [phone] After 3pm call: [phone]

## 2016-08-24 NOTE — Progress Notes (Signed)
eLink Physician-Brief Progress Note Patient Name: Ann Helderry Ann Magee DOB: [birth year] MRN: [id number]   Date of Service  08/24/2016  HPI/Events of Note  Mg 1.2  eICU Interventions  Repleted     Intervention Category Evaluation Type: Other  Arseniy Toomey 08/24/2016, 6:56 AM

## 2016-08-24 NOTE — Progress Notes (Signed)
Alois Clicherish Boyst, friend of the patient called and spoke with bedside nurse, Angelique Blonderenise. She was not content with the information given to her in answer to her questions/statements so she requested to speak with the charge nurse.  This nurse, Care Coordinator, spoke with Trish. Rosann Auerbachrish was adamant that we understand when Isabel [name] raises her hand towards her mouth that "she is not agitated she just wants the tube out, and that's her way of communicating to us." Trish explained that she communicated with Isabel [name] by blinking once for yes and twice for no, and Isabel [name] was on a ventilator 30 years ago and she, Rosann Auerbachrish, has been dealing with Isabel [name] for years and years. She also said we were not going to be able to communicate with Isabel [name] until she took her home meds for her mind and that she only takes them at night because they make her sleepy.  Rosann Auerbachrish was also concerned that Isabel [name] had a "rash" on her left arm and she was concerned it would cause Isabel [name] problems. This RN checked the arm and explained to Rosann Auerbachrish it was a bruise with petechiae from the PICC insertion, not a rash.  The dayshift nurse addressed all this concerns in the conversation earlier, however, Rosann Auerbachrish was not pleased with the answers. This RN validated to Trish that I understood her concerns and would pass the information on to the dayshift RN concerning her meds, etc.

## 2016-08-24 NOTE — Progress Notes (Signed)
Nutrition Follow-up  DOCUMENTATION CODES:   Not applicable  INTERVENTION:  - Will adjust TF regimen: Pivot 1.5 @ 40 mL/hr and d/c Prostat order. This will + kcal from Propofol will provide 1551 kcal, 90 grams of protein, and 729 mL free water. This regimen will also provide 166 grams of carbohydrate and 384 mg Mg.  - Continue PEPuP protocol. - Free water flush per MD/NP, if desired. - RD will follow-up 9/20.  NUTRITION DIAGNOSIS:   Inadequate oral intake related to inability to eat as evidenced by NPO status. -ongoing  GOAL:   Patient will meet greater than or equal to 90% of their needs -met  MONITOR:   Vent status, TF tolerance, Weight trends, Labs, I & O's  ASSESSMENT:   60 year-old with past medical history significant for poorly controlled diabetes, bipolar disorder, asthma/COPD, obstructive sleep apnea, hypertension, and GERD. Her diabetes is managed with our endocrinology, whom her caregiver contacted around 1 PM today 9/14 with complaints that the patient had been altered and laying on the floor since about 3 AM and her blood sugars have been reading as "high" on their home glucometer for the past 2 days. During that time she had not been eating or drinking very much. EMS transported her to [facility] emergency room where she was found to be profoundly hypertensive with leukocytosis. She was emergently intubated for airway protection. ABG taken at that time demonstrated profound metabolic acidosis with bicarbonate less than 7. Based on lack of improvement and need for critical care admission she was transferred to [facility] ICU.  9/19 Pt with OGT. Current TF order: Pivot 1.5 @ 30 mL/hr with 30 mL Prostat once/day to provide 1180 kcal, 83 grams of protein, and 546 mL free water. At time of RD visit, Pivot 1.5 was running @ 60 mL/hr which provides 2160 kcal, 135 grams of protein, and 1093 mL free water and far exceeds estimated needs. Estimated kcal need updated this AM; continue  to use admission weight of 62.4 kg. Weight trending down from yesterday and now +7.4 kg from admission weight.  This AM, IVF of D10 was d/c'ed due to CBGs in the 300s. Will adjust TF as outlined above to meet nutrition needs. Phos now WDL and Mg remains low with repletion ordered for today.   Patient is currently intubated on ventilator support MV: 9.3 L/min Temp (24hrs), Avg:99 F (37.2 C), Min:98.8 F (37.1 C), Max:99.6 F (37.6 C) Propofol: 4.2 ml/hr (111 kcal).   Medications reviewed; sliding scale Novolog, 2 g IV Mg sulfate x1 dose yesterday and x1 dose today, 40 mEq IV KPhos x1 dose yesterday.   Labs reviewed; CBGs: 378 and 382 mg/dL this AM, BUN: 29 mg/dL, creatinine: 1.25 mg/dL, Ca: 8.2 mg/dL, Mg: 1.5 mg/dL, Phos WDL, GFR: 46 mL/min.  Drips: Fentanyl @ 100 mcg/hr, Propofol @ 10 mcg/kg/min.    9/18 - Pt with OGT and currently receiving TF at goal rate: Glucerna 1.2 @ 60 mL/hr with no free water flush ordered.  - This regimen is providing 1728 kcal, 86 grams of protein, and 1159 mL free water.  - TF is providing 103% of estimated kcal need.  - Estimated kcal need adjusted and based on admission weight (62.4 kg) as weight has been trending up since that time and is now +8.6 kg from admission. - Will change TF regimen based on hypoglycemia, hypomagnesemia, and hypophosphatemia:  Pivot 1.5 @ 30 mL/hr with 30 mL Prostat once/day. This regimen + kcal from IVF will provide  1792 kcal (107% estimated kcal need), 83 grams of protein, and 546 mL free water. (Pivot 1.5 @ 30 mL/hr with 30 mL Prostat also provides 134 grams carbohydrate, 770 mg Phos, and 288 mg Mg).    Patient is currently intubated on ventilator support MV: 16 L/min Temp (24hrs), Avg:98.5 F (36.9 C), Max:99.2 F (37.3 C) IVF: D10 @ 75 mL/hr (612 kcal). Drip: Fentanyl @ 150 mcg/hr.    9/17 - Patient currently receiving Vital HP @ 20 ml/hr, providing 480 kcal and 42g of protein.  - TF recommendations provided in  initial assessment 9/15, RD to order according to recommendations.   Patient is currently intubated on ventilator support MV: 14.8L/min Temp (24hrs), Avg:98.6 F (37 C), Max:99.1 F (37.3 C)   Diet Order:  Diet NPO time specified  Skin:  Reviewed, no issues  Last BM:  9/17  Height:   Ht Readings from Last 1 Encounters:  08/19/16 5' 5"  (1.651 m)    Weight:   Wt Readings from Last 1 Encounters:  08/24/16 153 lb 14.1 oz (69.8 kg)    Ideal Body Weight:  56.82 kg  BMI:  Body mass index is 25.61 kg/m.  Estimated Nutritional Needs:   Kcal:  2202  Protein:  75-94 grams (1.2-1.5 grams/kg)  Fluid:  per MD  EDUCATION NEEDS:   No education needs identified at this time    Jarome Matin, MS, RD, LDN Inpatient Clinical Dietitian Pager # [phone] After hours/weekend pager # [phone]

## 2016-08-24 NOTE — Telephone Encounter (Signed)
I spoke with Trish and everything has been resolved.

## 2016-08-24 NOTE — Progress Notes (Signed)
eLink Physician-Brief Progress Note Patient Name: Ann Helderry Ann Brownstein DOB: [birth year] MRN: [id number]   Date of Service  08/24/2016  HPI/Events of Note  Extubated today. Caregiver request home nocturnal CPAP. Setting not known.  eICU Interventions  Will order: 1. Nocturnal CPAP. Auto titration or CPAP settings per RT is Auto titration not available.      Intervention Category Intermediate Interventions: Other:  Lenell AntuSommer,Steven Eugene 08/24/2016, 3:56 PM

## 2016-08-24 NOTE — Telephone Encounter (Signed)
Pt cousin Rosann Auerbachtrish would like sylvia to return her call concerning this patient. Pt is in hospital. [facility]

## 2016-08-24 NOTE — Evaluation (Signed)
SLP Cancellation Note  Patient Details Name: Ann Helderry Ann Rosal MRN: [id number] DOB: [birth year]   Cancelled treatment:       Reason Eval/Treat Not Completed:  (order for swallow evaluation received, pt now extubated at approx noon today after approx 5 days intubation; will see in the early am 9/20 ; thanks )   Donavan Burnetamara Amorina Doerr, MS [facility] SLP [phone]

## 2016-08-24 NOTE — Progress Notes (Signed)
Spoke with Rosann Auerbachrish, patient's caregiver and she is still very concerned about patient getting her "mentality" medications and they only be given at night and that she is to take Valium 4 times a day and that is the only medication that she takes during the day.   MD ordered PRN Ativan to replace the patient's Valium until she has a swallowing evaluation. This evaluation is scheduled for tomorrow, and this was explained to Trish.  She also wants to make sure that the patient has CPAP at night because that is what she is used to. Will call and get order from E-link doctor for PRN-QHS CPAP.

## 2016-08-24 NOTE — Progress Notes (Signed)
Inpatient Diabetes Program Recommendations  AACE/ADA: New Consensus Statement on Inpatient Glycemic Control (2015)  Target Ranges:  Prepandial:   less than 140 mg/dL      Peak postprandial:   less than 180 mg/dL (1-2 hours)      Critically ill patients:  140 - 180 mg/dL   Lab Results  Component Value Date   GLUCAP 231 (H) 08/24/2016   HGBA1C 11.2 (H) 08/19/2016    Review of Glycemic Control  FBS elevated. Blood sugars 173-382.  Inpatient Diabetes Program Recommendations:    Levemir 20 units Q24H Novolog 3 units Q4H for TF coverage.  Will continue to follow. Thank you. Ailene Ardshonda Jamayia Croker, RD, LDN, CDE Inpatient Diabetes Coordinator [phone]

## 2016-08-24 NOTE — Progress Notes (Signed)
eLink Physician-Brief Progress Note Patient Name: Isabel [name] Isabel Parlee DOB: 1956/03/06 MRN: [id number]   Date of Service  08/24/2016  HPI/Events of Note  Blood sugar 378  eICU Interventions  Stop dextrose drip        Aleyza Salmi 08/24/2016, 4:22 AM

## 2016-08-24 NOTE — Telephone Encounter (Signed)
I contacted the patient's care giver, Rosann Auerbachrish. She stated this morning when she called she was trying to get information on what the Hospital is doing for the patient. She stated about 1145 the nurse supervisor called and advised on the status of the patient and she had no further questions at this time.

## 2016-08-24 NOTE — Procedures (Signed)
Extubation Procedure Note  Patient Details:   Name: Isabel [name] Isabel [name] DOB: Feb 07, 1956 MRN: [id number]   Airway Documentation:  Airway 7.5 mm (Active)  Secured at (cm) 22 cm 08/24/2016  7:49 AM  Measured From Lips 08/24/2016  7:49 AM  Secured Location Right 08/24/2016  3:00 AM  Secured By [employer] Tube Holder 08/24/2016  7:49 AM  Tube Holder Repositioned Yes 08/24/2016  7:49 AM  Cuff Pressure (cm H2O) 24 cm H2O 08/22/2016  7:40 PM  Site Condition Dry 08/23/2016 12:50 PM    Evaluation  O2 sats: stable throughout Complications: No apparent complications Patient did tolerate procedure well. Bilateral Breath Sounds: Clear, Diminished   No  [name], [name] 08/24/2016, 11:54 AM  -Positive cuff leak pre extubation.  -Unsure of verbal baseline- MD and NP have viewed PT post intubation. PT does not appear to be in respiratory distress at this time.  -On 2 lpm [place]  -RN aware

## 2016-08-24 NOTE — Progress Notes (Signed)
eLink Physician-Brief Progress Note Patient Name: Isabel [name] Isabel Trageser DOB: August 23, 1956 MRN: [id number]   Date of Service  08/24/2016  HPI/Events of Note  Agitated, biting the ETT Maxed out on fentanyl  eICU Interventions  Start propofol     Intervention Category Minor Interventions: Agitation / anxiety - evaluation and management  Godfrey Tritschler 08/24/2016, 4:07 AM

## 2016-08-24 NOTE — Telephone Encounter (Signed)
Trish, pts caregiver, is requesting a call back please

## 2016-08-25 ENCOUNTER — Inpatient Hospital Stay (HOSPITAL_COMMUNITY): Payer: Medicare Other

## 2016-08-25 DIAGNOSIS — G9340 Encephalopathy, unspecified: Secondary | ICD-10-CM

## 2016-08-25 LAB — BASIC METABOLIC PANEL
Anion gap: 9 (ref 5–15)
BUN: 30 mg/dL — AB (ref 6–20)
CHLORIDE: 115 mmol/L — AB (ref 101–111)
CO2: 21 mmol/L — AB (ref 22–32)
Calcium: 8.5 mg/dL — ABNORMAL LOW (ref 8.9–10.3)
Creatinine, Ser: 1.24 mg/dL — ABNORMAL HIGH (ref 0.44–1.00)
GFR calc Af Amer: 54 mL/min — ABNORMAL LOW (ref >60)
GFR calc non Af Amer: 47 mL/min — ABNORMAL LOW (ref >60)
GLUCOSE: 272 mg/dL — AB (ref 65–99)
POTASSIUM: 3.8 mmol/L (ref 3.5–5.1)
Sodium: 145 mmol/L (ref 135–145)

## 2016-08-25 LAB — GLUCOSE, CAPILLARY
GLUCOSE-CAPILLARY: 237 mg/dL — AB (ref 65–99)
GLUCOSE-CAPILLARY: 329 mg/dL — AB (ref 65–99)
GLUCOSE-CAPILLARY: 234 mg/dL — AB (ref 65–99)
GLUCOSE-CAPILLARY: 237 mg/dL — AB (ref 65–99)
Glucose-Capillary: 245 mg/dL — ABNORMAL HIGH (ref 65–99)
Glucose-Capillary: 291 mg/dL — ABNORMAL HIGH (ref 65–99)
Glucose-Capillary: 248 mg/dL — ABNORMAL HIGH (ref 65–99)

## 2016-08-25 LAB — CBC
HEMATOCRIT: 26.7 % — AB (ref 36.0–46.0)
Hemoglobin: 9.2 g/dL — ABNORMAL LOW (ref 12.0–15.0)
MCH: 26.8 pg (ref 26.0–34.0)
MCHC: 34.5 g/dL (ref 30.0–36.0)
MCV: 77.8 fL — AB (ref 78.0–100.0)
Platelets: 158 10*3/uL (ref 150–400)
RBC: 3.43 MIL/uL — ABNORMAL LOW (ref 3.87–5.11)
RDW: 17.4 % — AB (ref 11.5–15.5)
WBC: 10.3 10*3/uL (ref 4.0–10.5)

## 2016-08-25 LAB — LEGIONELLA PNEUMOPHILA SEROGP 1 UR AG: L. PNEUMOPHILA SEROGP 1 UR AG: NEGATIVE

## 2016-08-25 MED ORDER — INSULIN GLARGINE 100 UNIT/ML ~~LOC~~ SOLN
5.0000 [IU] | Freq: Every day | SUBCUTANEOUS | Status: DC
Start: 2016-08-25 — End: 2016-08-26
  Administered 2016-08-25 – 2016-08-26 (×2): 5 [IU] via SUBCUTANEOUS
  Filled 2016-08-25 (×2): qty 0.05

## 2016-08-25 MED ORDER — MAGNESIUM SULFATE 2 GM/50ML IV SOLN
2.0000 g | Freq: Once | INTRAVENOUS | Status: AC
  Administered 2016-08-25: 2 g via INTRAVENOUS
  Filled 2016-08-25: qty 50

## 2016-08-25 MED ORDER — IPRATROPIUM-ALBUTEROL 0.5-2.5 (3) MG/3ML IN SOLN
3.0000 mL | Freq: Three times a day (TID) | RESPIRATORY_TRACT | Status: DC
  Administered 2016-08-26 – 2016-08-27 (×4): 3 mL via RESPIRATORY_TRACT
  Filled 2016-08-25 (×4): qty 3

## 2016-08-25 NOTE — Evaluation (Signed)
SLP Cancellation Note  Patient Details Name: Isabel [name] Isabel Algeo MRN: [id number] DOB: [birth year]   Cancelled treatment:       Reason Eval/Treat Not Completed: Fatigue/lethargy limiting ability to participate;Medical issues which prohibited therapy (pt lethargic and did not awaken adequately for po or eval despite tactile, verbal stimulation, RR in the 30s and pt coughing/gagging - Rec continue NPO with oral care- SLP left pt partially upright and attempted to oral suction but did not clear anything from oral cavity, will continue efforts )  Donavan Burnetamara Levone Otten, MS [facility] SLP [phone]  Chales AbrahamsKimball, Verenise Moulin Isabel 08/25/2016, 9:57 AM

## 2016-08-25 NOTE — Progress Notes (Signed)
Spoke with Rosann Auerbachrish numerous times this shift who inquired about her speech evaluation.  Informed her that she was too lethargic for a full speech evaluation per speech therapy.  Rosann Auerbachrish was very attament that [place] receive her "mentality" medications.  This nurse informed Rosann Auerbachrish that she would remain NPO due to aspiration risks.  Erick Blinksuchman, Ankush Gintz D, RN

## 2016-08-25 NOTE — Progress Notes (Signed)
PULMONARY / CRITICAL CARE MEDICINE   Name: Isabel [name] Isabel Livers MRN: [id number] DOB: Mar 04, 1956    ADMISSION DATE:  08/19/2016 CONSULTATION DATE:  08/19/2016  REFERRING MD:  Dr. Epimenio FootSnyder [place] EDP  CHIEF COMPLAINT:  Unresponsive  HISTORY OF PRESENT ILLNESS:   [name] has past medical history as below, which is significant for poorly controlled diabetes, Bipolar disorder, asthma/COPD, obstructive sleep apnea, hypertension, and GERD. Her diabetes is managed with our endocrinology, whom her caregiver contacted around 1 PM today 9/14 with complaints that the patient had been altered and laying on the floor since about 3 AM and her blood sugars have been reading as "high" on their home glucometer for the past 2 days. During that time. She had not been eating or drinking very much either. She was advised to go to the emergency department, EMS was contacted and [facility]. There, she was found to be profoundly hypertensive with leukocytosis. She was emergently intubated for airway protection. ABG taken at that time demonstrated profound metabolic acidosis with bicarbonate less than 7. PH 6.9. Urine was positive for ketones and she was started on insulin infusion, IV fluid resuscitation, and sodium bicarbonate infusion. She was also felt to possibly be septic and was started on broad-spectrum antibiotics. Source of possible infection not entirely clear at that point. Serial ABG samples did not demonstrate much improvement despite the aforementioned therapies. Based on lack of improvement and need for critical care admission she was transferred to [facility] ICU.     SUBJECTIVE:   Extubated 9/19.  Tolerated CPAP overnight.  No acute distress.  Large BM this am. Hemodynamically stable.   VITAL SIGNS: BP (!) 151/93   Pulse 97   Temp 97.5 F (36.4 C) (Axillary)   Resp 16   Ht 5\' 5"  (1.651 m)   Wt 153 lb 14.1 oz (69.8 kg)   SpO2 99%   BMI 25.61 kg/m    HEMODYNAMICS:    VENTILATOR SETTINGS:    INTAKE / OUTPUT: I/O last 3 completed shifts: In: 2983.3 [I.V.:1813.3; Other:120; NG/GT:350; IV Piggyback:700] Out: 8295 [AOZHY:[id number] [Urine:6875; Stool:2]  PHYSICAL EXAMINATION: General:  Adult female, appears older than stated age Neuro:  Intermittent agitation, not directible, does not follow commands but moves all extremities spontaneously  HEENT:  /AT, no JVD Cardiovascular:  RRR, no m/r/g Lungs:  Even/non-labored, lungs bilaterally clear Abdomen:  Soft, non-tender, non-distended Musculoskeletal:  No acute deformity or ROM limitation Skin:  Rash to sternal chest and under chin -resolved,  excoriation to L inguinal fold.   LABS:  BMET  Recent Labs Lab 08/23/16 0545 08/24/16 0529 08/24/16 1657 08/25/16 0430  NA 141 135  --  145  K 3.5 4.0  --  3.8  CL 118* 109  --  115*  CO2 18* 18*  --  21*  BUN 35* 29*  --  30*  CREATININE 1.24* 1.25*  --  1.24*  GLUCOSE 60* 431* 372* 272*    Electrolytes  Recent Labs Lab 08/21/16 0400  08/23/16 0545 08/24/16 0529 08/25/16 0430  CALCIUM 8.0*  < > 8.9 8.2* 8.5*  MG 2.0  --  1.6* 1.5*  --   PHOS 1.2*  --  1.5* 3.1  --   < > = values in this interval not displayed.  CBC  Recent Labs Lab 08/23/16 0545 08/24/16 0529 08/25/16 0430  WBC 14.2* 8.6 10.3  HGB 9.7* 9.4* 9.2*  HCT 26.8* 26.4* 26.7*  PLT 99* 101* 158    Coag's  No results for input(s): APTT, INR in the last 168 hours.  Sepsis Markers  Recent Labs Lab 08/19/16 2250 08/20/16 0140 08/20/16 0430 08/21/16 0400 08/24/16 0529  LATICACIDVEN 2.1* 2.0*  --   --   --   PROCALCITON 0.92  --  1.83 1.67 0.20    ABG  Recent Labs Lab 08/19/16 2258  PHART 7.091*  PCO2ART 21.7*  PO2ART 117*    Liver Enzymes  Recent Labs Lab 08/19/16 2250 08/21/16 0400  AST 27  --   ALT 18  --   ALKPHOS 143*  --   BILITOT 1.6*  --   ALBUMIN 2.8* 2.3*    Cardiac Enzymes  Recent Labs Lab 08/19/16 2250 08/20/16 0430  08/20/16 1254  TROPONINI <0.03 0.08* 0.11*    Glucose  Recent Labs Lab 08/24/16 0735 08/24/16 1225 08/24/16 1611 08/24/16 1923 08/24/16 2342 08/25/16 0318  GLUCAP 382* 231* 418* 195* 351* 237*    Imaging Dg Chest Port 1 View  Result Date: 08/25/2016 CLINICAL DATA:  Respiratory failure EXAM: PORTABLE CHEST 1 VIEW COMPARISON:  August 24, 2016 FINDINGS: Endotracheal tube and nasogastric tube have been removed. Central catheter tip is at the cavoatrial junction. No pneumothorax. There is patchy opacity in the lower lung zones. Lungs elsewhere clear. Heart is mildly enlarged with pulmonary vascularity within normal limits. IMPRESSION: Patchy airspace opacity in the lower lung zones persists without change. Question pneumonia versus atypical pulmonary edema. No new opacity. Stable cardiac silhouette. No pneumothorax. Electronically Signed   By: Bretta Bang III M.D.   On: 08/25/2016 07:15     STUDIES:    CULTURES: Blood Cultures Duke Salvia 9/14 >> 1/2 coag neg staph (prelim) >>  Urine Cx Duke Salvia 9/14 >> not done MRSA PCR 9/14 >> positive  ANTIBIOTICS: Levaquin 9/14 >> 9/20 Vancomycin 9/14 >>   SIGNIFICANT EVENTS: 9/14  Admit with DKA 9/16  off insulin gtt & levo 9/17  Remains critically ill, intubated, int agitation, off levo, afebrile  LINES/TUBES: R femoral line 9/14 >> 9/16 LUE PICC 9/16 >>  ETT 9/14 >> 9/19  DISCUSSION: Much improved, DKA -resolved, shock - resolved.  Weans but agitation & poor mental status limits extubation  ASSESSMENT / PLAN:  PULMONARY A: Inability to protect airway secondary to profound acidemia in setting of DKA. LLL Airspace Disease COPD without acute exacerbation OSA - on CPAP at baseline P:   Pulmonary hygiene - mobilize as able, doubt she can participate with IS Intermittent CXR Scheduled DuoNeb's, budesonide. PRN albuterol Nocturnal CPAP  See ID  CARDIOVASCULAR A:  Shock - likely in the setting of hypovolemia, but ddx  includes sepsis. Also, less likely but consider anaphylaxis she has latex allergy and latex Foley was placed at [facility]  Resolved.   P:  ICU monitoring of hemodynamics   RENAL A:   Acute kidney injury - suspect secondary to dehydration, possible rhabdo with lying on floor Hyponatremia  Hypomagnesemia  Hypokalemia High anion gap metabolic acidosis in the setting of ketonemia, then NAG acidosis-resolved.  P:   NS at 40 ml/hr  Trend BMP / UOP  Replace electrolytes as indicated   GASTROINTESTINAL A:   Ventilator Dependent  GERD hx P:   Pepcid  SLP evaluation pending  HEMATOLOGIC A:   Thrombocytopenia Anemia   P:  Follow CBC  Subcutaneous heparin for DVT prophylaxis   INFECTIOUS A:   Septic shock - ? LLL infiltrate, noted on admit, resolved with IVF's.  L inguinal fold excoriation. P:   Trend WBC  and fever curve  PCT decreased, abx end date in place Antibiotics as above Wound care consult.   ENDOCRINE A:   Diabetic ketoacidosis with history of insulin-dependent diabetes. Hypoglycemia - overnight 9/17-9/18 P:   SSI - sensitive scale Monitor glucose See DM Coordinator note > pt not reliably taking PO's at this point.   Add Lantus 5 units QD with hyperglycemia   NEUROLOGIC A:   Acute metabolic encephalopathy in the setting of severe acidosis Bipolar disorder ? Dementia - unclear baseline mental status / functional level Dyslexia PTSD  Depression  P:   RASS goal: n/a Continue home Pristiq (formulary substitute of effexor) Baseline 5mg  valium use Q8 PRN.  Lorazepam 1 mg IV q4 until able to take PO's Continue lamictal PT consult Monitor for resolution of encephalopathy.  See below.  Per POA pt baseline > bathe herself with help from aide, dress herself, able to take medications but has to have help with putting meds in pill box, able to cook herself meals, does not drive, if given time she could balance her checkbook, can have conversation with you -  "talk your head off".     FAMILY  - Updates:  No family at bedside am 9/19.  Cousin Trish de facto POA.  Called 9/19 for update and no answer [phone]).    - Inter-disciplinary family meet or Palliative Care meeting due by:   9/21   Transfer to SDU status, TRH as of am 9/21 for primary SVC.     Canary Brim, NP-C [facility] Pgr: [phone] or if no answer [phone] 08/25/2016, 8:30 AM

## 2016-08-25 NOTE — Progress Notes (Signed)
Nutrition Follow-up  DOCUMENTATION CODES:   Not applicable  INTERVENTION:  - Diet advancement per SLP recommendation. - RD will follow-up 9/22.  NUTRITION DIAGNOSIS:   Inadequate oral intake related to inability to eat as evidenced by NPO status. -ongoing  GOAL:   Patient will meet greater than or equal to 90% of their needs -unmet since extubation and d/c of TF.  MONITOR:   Diet advancement, Weight trends, Labs, Skin, I & O's  ASSESSMENT:   60 year-old with past medical history significant for poorly controlled diabetes, bipolar disorder, asthma/COPD, obstructive sleep apnea, hypertension, and GERD. Her diabetes is managed with our endocrinology, whom her caregiver contacted around 1 PM today 9/14 with complaints that the patient had been altered and laying on the floor since about 3 AM and her blood sugars have been reading as "high" on their home glucometer for the past 2 days. During that time she had not been eating or drinking very much. EMS [facility] room where she was found to be profoundly hypertensive with leukocytosis. She was emergently intubated for airway protection. ABG taken at that time demonstrated profound metabolic acidosis with bicarbonate less than 7. Based on lack of improvement and need for critical care admission she was transferred to [facility] ICU.  9/20 Pt extubated at approximately noon yesterday and OGT removed at that time; TF no longer infusing. SLP, per note yesterday, plans to see pt today. Estimated nutrition needs updated based on extubation. Weight has now been stable since 9/16 and CBW used in re-estimating needs. Will follow-up 9/22 to assess for needs based on diet recommendations per SLP and pt needs.  Medications reviewed; sliding scale Novolog, 5 units Lantus/day.   Labs reviewed; CBGs: 237 and 245 mg/dL today, Cl: 161115 mmol/L, BUN: 30 mg/dL, creatinine: 0.961.24 mg/dL, Ca: 8.5 mg/dL, Mg: 1.5 mg/dL yesterday, GFR: 47  mL/min. IVF: NS @ 40 mL/hr.     9/19 - Pt with OGT.  - Current TF order: Pivot 1.5 @ 30 mL/hr with 30 mL Prostat once/day to provide 1180 kcal, 83 grams of protein, and 546 mL free water.  - At time of RD visit, Pivot 1.5 was running @ 60 mL/hr which provides 2160 kcal, 135 grams of protein, and 1093 mL free water.  - Estimated kcal need updated this AM; continue to use admission weight of 62.4 kg.  - Weight trending down from yesterday and now +7.4 kg from admission weight. - This AM, IVF of D10 was d/c'ed due to CBGs in the 300s.  - Will adjust TF to meet nutrition needs.  - Phos now WDL and Mg remains low with repletion ordered for today.   Patient is currently intubated on ventilator support MV: 9.3 L/min Temp (24hrs), Avg:99 F (37.2 C), Max:99.6 F (37.6 C) Propofol: 4.2 ml/hr (111 kcal). Drips: Fentanyl @ 100 mcg/hr, Propofol @ 10 mcg/kg/min.    9/18 - Pt with OGT and currently receiving TF at goal rate: Glucerna 1.2 @ 60 mL/hr with no free water flush ordered.  - This regimen is providing 1728 kcal, 86 grams of protein, and 1159 mL free water.  - TF is providing 103% of estimated kcal need.  - Estimated kcal need adjusted and based on admission weight (62.4 kg) as weight has been trending up since that time and is now +8.6 kg from admission. - Will change TF regimen based on hypoglycemia, hypomagnesemia, and hypophosphatemia:  Pivot 1.5 @ 30 mL/hr with 30 mL Prostat once/day.This regimen +  kcal from IVF will provide 1792 kcal (107% estimated kcal need), 83 grams of protein, and 546 mL free water. (Pivot 1.5 @ 30 mL/hr with 30 mL Prostat also provides 134 grams carbohydrate, 770 mg Phos, and 288 mg Mg).    Patient is currently intubated on ventilator support MV: 16L/min Temp (24hrs), Avg:98.5 F (36.9 C), Max:99.2 F (37.3 C) IVF:D10 @ 75 mL/hr (612 kcal). Drip:Fentanyl @ 150 mcg/hr.    Diet Order:  Diet NPO time specified  Skin:  Reviewed, no  issues  Last BM:  9/20  Height:   Ht Readings from Last 1 Encounters:  08/19/16 5\' 5"  (1.651 m)    Weight:   Wt Readings from Last 1 Encounters:  08/25/16 153 lb 14.1 oz (69.8 kg)    Ideal Body Weight:  56.82 kg  BMI:  Body mass index is 25.61 kg/m.  Estimated Nutritional Needs:   Kcal:  1350-1550  Protein:  50-60 grams  Fluid:  >/= 1.5 L/day  EDUCATION NEEDS:   No education needs identified at this time    Trenton Gammon, MS, RD, LDN Inpatient Clinical Dietitian Pager # [phone] After hours/weekend pager # [phone]

## 2016-08-25 NOTE — Evaluation (Addendum)
Physical Therapy Evaluation Patient Details Name: Isabel [name] Isabel Niehaus MRN: [id number] DOB: 02/05/1956 Today's Date: 08/25/2016   History of Present Illness  [name] has past medical history as below, which is significant for poorly controlled diabetes, Bipolar disorder, asthma/COPD, obstructive sleep apnea, hypertension, and GERD. she [facility] with AMS, hypertensive and hypergycemia.. Intubated immediately. Extubated 08/24/16.  Clinical Impression  The patient was aroused during the evaluation, Possibly attempted to mouth her name. She did follow 1 simple command. The trunk and extremities ar very floppy and low tone, minimal attempt to volitionally move the extremities.  Pt admitted with above diagnosis. Pt currently with functional limitations due to the deficits listed below (see PT Problem List).  Pt will benefit from skilled PT to increase their independence and safety with mobility to allow discharge to the venue listed below.       Follow Up Recommendations SNF;Supervision/Assistance - 24 hour    Equipment Recommendations   (tba)    Recommendations for Other Services       Precautions / Restrictions Precautions Precautions: Fall Precaution Comments: recfent extubation, very floppy      Mobility  Bed Mobility Overal bed mobility: Needs Assistance;+2 for physical assistance;+ 2 for safety/equipment Bed Mobility: Supine to Sit;Sit to Supine     Supine to sit: Total assist;+2 for physical assistance;+2 for safety/equipment Sit to supine: Total assist;+2 for physical assistance;+2 for safety/equipment   General bed mobility comments: assist to roll, place hand to rail but unable to grip the rail. leans forward and backward with no control.  Transfers                    Ambulation/Gait                Stairs            Wheelchair Mobility    Modified Rankin (Stroke Patients Only)       Balance Overall balance  assessment: Needs assistance;History of Falls Sitting-balance support: Feet unsupported;No upper extremity supported Sitting balance-Leahy Scale: Zero                                       Pertinent Vitals/Pain Pain Assessment: Faces Faces Pain Scale: Hurts little more Pain Location: unsure when slid patient up in bed. Pain Descriptors / Indicators: Grimacing Pain Intervention(s): Repositioned    Home Living Family/patient expects to be discharged to:: Unsure Living Arrangements: Other relatives                    Prior Function Level of Independence: Independent         Comments: per family member, per chart,      Hand Dominance        Extremity/Trunk Assessment   Upper Extremity Assessment: RUE deficits/detail;LUE deficits/detail RUE Deficits / Details: low tone of the extremity, did reach forward x 1.      LUE Deficits / Details: floppy, low tone   Lower Extremity Assessment: RLE deficits/detail;LLE deficits/detail RLE Deficits / Details: barely attempted  to extend the knee when sitting, floppy/low tone when placing the legs in hooklying.     Cervical / Trunk Assessment: Other exceptions  Communication   Communication:  (nonverbal)  Cognition Arousal/Alertness: Lethargic   Overall Cognitive Status: Impaired/Different from baseline Area of Impairment: Following commands;Awareness  General Comments: the patient followed 1 command to reach out   right hand.      General Comments      Exercises     Assessment/Plan    PT Assessment Patient needs continued PT services  PT Problem List Decreased strength;Decreased activity tolerance;Decreased mobility;Decreased knowledge of use of DME;Decreased safety awareness;Decreased cognition;Decreased knowledge of precautions;Decreased balance;Cardiopulmonary status limiting activity          PT Treatment Interventions Functional mobility training;Therapeutic  activities;Therapeutic exercise;Patient/family education;Cognitive remediation;Neuromuscular re-education    PT Goals (Current goals can be found in the Care Plan section)  Acute Rehab PT Goals Patient Stated Goal: unable PT Goal Formulation: Patient unable to participate in goal setting Time For Goal Achievement: 09/08/16 Potential to Achieve Goals: Fair    Frequency Min 3X/week   Barriers to discharge Decreased caregiver support      Co-evaluation               End of Session   Activity Tolerance: Patient tolerated treatment well Patient left: in bed;with call bell/phone within reach;with bed alarm set Nurse Communication: Mobility status         Time: 1610-9604 PT Time Calculation (min) (ACUTE ONLY): 24 min   Charges:   PT Evaluation $PT Eval Moderate Complexity: 1 Procedure PT Treatments $Therapeutic Activity: 8-22 mins   PT G Codes:        Rada Hay 08/25/2016, 4:30 PM Blanchard Kelch PT [phone]

## 2016-08-25 NOTE — Progress Notes (Signed)
Date:  August 25, 2016 Chart reviewed for concurrent status and case management needs. Will continue to follow the patient for changes and needs: remains unresponsive and agitiated/ ppc meeting on [id number]??? Discharge Planning: following for needs Marcelle SmilingRhonda Carlon Davidson, BSN, [place]3, [place]M   [phone]

## 2016-08-26 ENCOUNTER — Inpatient Hospital Stay (HOSPITAL_COMMUNITY): Payer: Medicare Other

## 2016-08-26 LAB — RETICULOCYTES
RBC.: 3.74 MIL/uL — ABNORMAL LOW (ref 3.87–5.11)
RETIC CT PCT: 1.5 % (ref 0.4–3.1)
Retic Count, Absolute: 56.1 10*3/uL (ref 19.0–186.0)

## 2016-08-26 LAB — BASIC METABOLIC PANEL
ANION GAP: 11 (ref 5–15)
BUN: 26 mg/dL — ABNORMAL HIGH (ref 6–20)
CHLORIDE: 114 mmol/L — AB (ref 101–111)
CO2: 24 mmol/L (ref 22–32)
CREATININE: 1.23 mg/dL — AB (ref 0.44–1.00)
Calcium: 8.9 mg/dL (ref 8.9–10.3)
GFR calc non Af Amer: 47 mL/min — ABNORMAL LOW (ref >60)
GFR, EST AFRICAN AMERICAN: 55 mL/min — AB (ref >60)
Glucose, Bld: 253 mg/dL — ABNORMAL HIGH (ref 65–99)
POTASSIUM: 3.1 mmol/L — AB (ref 3.5–5.1)
SODIUM: 149 mmol/L — AB (ref 135–145)

## 2016-08-26 LAB — IRON AND TIBC
IRON: 41 ug/dL (ref 28–170)
SATURATION RATIOS: 18 % (ref 10.4–31.8)
TIBC: 228 ug/dL — AB (ref 250–450)
UIBC: 187 ug/dL

## 2016-08-26 LAB — CBC
HEMATOCRIT: 29.4 % — AB (ref 36.0–46.0)
HEMOGLOBIN: 9.9 g/dL — AB (ref 12.0–15.0)
MCH: 26.7 pg (ref 26.0–34.0)
MCHC: 33.7 g/dL (ref 30.0–36.0)
MCV: 79.2 fL (ref 78.0–100.0)
PLATELETS: 251 10*3/uL (ref 150–400)
RBC: 3.71 MIL/uL — AB (ref 3.87–5.11)
RDW: 17.2 % — ABNORMAL HIGH (ref 11.5–15.5)
WBC: 12.8 10*3/uL — AB (ref 4.0–10.5)

## 2016-08-26 LAB — MAGNESIUM: Magnesium: 1.9 mg/dL (ref 1.7–2.4)

## 2016-08-26 LAB — VITAMIN B12: Vitamin B-12: 1920 pg/mL — ABNORMAL HIGH (ref 180–914)

## 2016-08-26 LAB — GLUCOSE, CAPILLARY
GLUCOSE-CAPILLARY: 211 mg/dL — AB (ref 65–99)
Glucose-Capillary: 232 mg/dL — ABNORMAL HIGH (ref 65–99)
Glucose-Capillary: 240 mg/dL — ABNORMAL HIGH (ref 65–99)
Glucose-Capillary: 378 mg/dL — ABNORMAL HIGH (ref 65–99)

## 2016-08-26 LAB — FERRITIN: FERRITIN: 291 ng/mL (ref 11–307)

## 2016-08-26 LAB — FOLATE: Folate: 36.6 ng/mL (ref >5.9)

## 2016-08-26 LAB — PHOSPHORUS: Phosphorus: 3.4 mg/dL (ref 2.5–4.6)

## 2016-08-26 LAB — OCCULT BLOOD X 1 CARD TO LAB, STOOL: FECAL OCCULT BLD: POSITIVE — AB

## 2016-08-26 MED ORDER — ACETAMINOPHEN 325 MG PO TABS
650.0000 mg | ORAL_TABLET | Freq: Four times a day (QID) | ORAL | Status: DC | PRN
  Administered 2016-08-26: 650 mg via ORAL
  Filled 2016-08-26: qty 2

## 2016-08-26 MED ORDER — RESOURCE THICKENUP CLEAR PO POWD
ORAL | Status: DC | PRN
  Filled 2016-08-26: qty 125

## 2016-08-26 MED ORDER — INSULIN GLARGINE 100 UNIT/ML ~~LOC~~ SOLN
20.0000 [IU] | Freq: Every day | SUBCUTANEOUS | Status: DC
  Filled 2016-08-26: qty 0.2

## 2016-08-26 MED ORDER — POTASSIUM CHLORIDE 10 MEQ/100ML IV SOLN
10.0000 meq | INTRAVENOUS | Status: AC
  Administered 2016-08-26 (×2): 10 meq via INTRAVENOUS
  Filled 2016-08-26 (×2): qty 100

## 2016-08-26 NOTE — Progress Notes (Addendum)
PROGRESS NOTE    Isabel [name]  VOZ:[id number] DOB: 05/23/1956 DOA: 08/19/2016 PCP: Isabel Morale, MD   Brief Narrative:  Isabel [name] has past medical history as below, which is significant for poorly controlled diabetes, Bipolar disorder, asthma/COPD, obstructive sleep apnea, hypertension, and GERD, presents with acute encephalopathy from severe DKA, acute respiratory failure with hypoxia from possible left lower lobe infiltrate on CXR and possible septic shock from the above.  She was transferred to [facility] service on 9/21  Assessment & Plan:   Active Problems:   Septic shock (HCC)   Acute hypoxemic respiratory failure (HCC)   Encephalopathy acute   Acute metabolic encephalopathy from severe DKA: Much improved,she is alert today but confused, suspect she is at baseline, will get in touch with family today.  Plan to initiate physical therapy and get her oob to chair.    Septic shock from left lower lobe infiltrate on cxr,  Improved. On levaquin to complete the course of the pneumonia.  On RA.   Acute respiratory failure with hypoxia probably from DKA and left lobe pneumonia: extubated on 9/19 on RA and comfortable.  On levaquin to complete the course. Last day today.  SLP evaluation to initiate oral intake.    Chronic respiratory failure from COPD: no wheezing today.    OSA on CPAP at baseline.    Acute renal failure; secondary to rhabdo, dehydration, DKA, improved.   Diabetes mellitus: CBG (last 3)   Recent Labs  08/25/16 2313 08/26/16 0423 08/26/16 0853  GLUCAP 248* 211* 232*    Increase the lantus to 20 units and resume SSI.    HYPOKALEMIA: replete as needed.    Hypernatremia: free fluid deficit. After slp EVAL would start free water boluses.    Normocytic anemia:  Baseline hemoglobin betweenn 11 to 13.  Currently 9.  Get anemia panel and stool for occult blood.  Follow H&H in am.            DVT prophylaxis: (Lovenox/) Code Status: FULL  CODE.  Family Communication: none at bedside.  Disposition Plan: SNF in 1 to 2 days.    Consultants:   PCCM primary till 9/20.  Procedures:   Blood Cultures Isabel [name] 9/14 >> 1/2 coag neg staph (prelim) >>  Urine Cx Isabel [name] 9/14 >> not done MRSA PCR 9/14 >> positive  ANTIBIOTICS: Levaquin 9/14 >> 9/21   SIGNIFICANT EVENTS: 9/14  Admit with DKA 9/16  off insulin gtt & levo 9/17  Remains critically ill, intubated, int agitation, off levo, afebrile 9/21 transferred to [facility] service.   LINES/TUBES: R femoral line 9/14 >> 9/16 LUE PICC 9/16 >>  ETT 9/14 >> 9/19    Subjective: Denies any new complaints, told me she is in [place].  Confused.   Objective: Vitals:   08/26/16 0724 08/26/16 0745 08/26/16 0800 08/26/16 0900  BP:   (!) 162/80 (!) 141/82  Pulse:   86 86  Resp:   (!) 25 (!) 32  Temp: 98.6 F (37 C)  98.4 F (36.9 C)   TempSrc: Oral  Oral   SpO2:  98% 100% 100%  Weight:      Height:        Intake/Output Summary (Last 24 hours) at 08/26/16 0953 Last data filed at 08/26/16 0911  Gross per 24 hour  Intake              940 ml  Output             1500 ml  Net             -  560 ml   Filed Weights   08/24/16 0500 08/25/16 0349 08/26/16 0500  Weight: 69.8 kg (153 lb 14.1 oz) 69.8 kg (153 lb 14.1 oz) 60.3 kg (132 lb 15 oz)    Examination:  General exam: Appears calm and comfortable  Respiratory system: Clear to auscultation. Respiratory effort normal. Cardiovascular system: S1 & S2 heard, RRR. No JVD, murmurs, rubs, gallops or clicks. No pedal edema. Gastrointestinal system: Abdomen is nondistended, soft and MILD GEN TENDERESS PRESENT. No organomegaly or masses felt. Normal bowel sounds heard. Central nervous system: Alert but confused, not oriented. Able to move all extremities.  Skin: No rashes, lesions or ulcers     Data Reviewed: I have personally reviewed following labs and imaging studies  CBC:  Recent Labs Lab 08/22/16 0415  08/23/16 0545 08/24/16 0529 08/25/16 0430 08/26/16 0630  WBC 18.5* 14.2* 8.6 10.3 12.8*  HGB 10.4* 9.7* 9.4* 9.2* 9.9*  HCT 28.7* 26.8* 26.4* 26.7* 29.4*  MCV 74.2* 75.1* 74.8* 77.8* 79.2  PLT 85* 99* 101* 158 885   Basic Metabolic Panel:  Recent Labs Lab 08/19/16 2250  08/20/16 1448  08/21/16 0400  08/22/16 0415 08/23/16 0545 08/24/16 0529 08/24/16 1657 08/25/16 0430 08/26/16 0630  NA 133*  < > 135  < > 133*  < > 138 141 135  --  145 149*  K 3.0*  < > 2.7*  < > 4.0  < > 3.7 3.5 4.0  --  3.8 3.1*  CL 106  < > 113*  < > 113*  < > 118* 118* 109  --  115* 114*  CO2 9*  < > 15*  < > 14*  < > 15* 18* 18*  --  21* 24  GLUCOSE 542*  < > 162*  < > 156*  < > 181* 60* 431* 372* 272* 253*  BUN 50*  < > 54*  < > 52*  < > 48* 35* 29*  --  30* 26*  CREATININE 1.57*  < > 1.31*  < > 1.47*  < > 1.42* 1.24* 1.25*  --  1.24* 1.23*  CALCIUM 7.7*  < > 7.8*  < > 8.0*  < > 8.4* 8.9 8.2*  --  8.5* 8.9  MG 1.1*  --  1.7  --  2.0  --   --  1.6* 1.5*  --   --  1.9  PHOS 2.1*  --   --   --  1.2*  --   --  1.5* 3.1  --   --  3.4  < > = values in this interval not displayed. GFR: Estimated Creatinine Clearance: 44.9 mL/min (by C-G formula based on SCr of 1.23 mg/dL (H)). Liver Function Tests:  Recent Labs Lab 08/19/16 2250 08/21/16 0400  AST 27  --   ALT 18  --   ALKPHOS 143*  --   BILITOT 1.6*  --   PROT 5.6*  --   ALBUMIN 2.8* 2.3*    Recent Labs Lab 08/19/16 2250  LIPASE 57*  AMYLASE 392*   No results for input(s): AMMONIA in the last 168 hours. Coagulation Profile: No results for input(s): INR, PROTIME in the last 168 hours. Cardiac Enzymes:  Recent Labs Lab 08/19/16 2250 08/20/16 0430 08/20/16 0830 08/20/16 1254  CKTOTAL 869*  --  606*  --   TROPONINI <0.03 0.08*  --  0.11*   BNP (last 3 results) No results for input(s): PROBNP in the last 8760 hours. HbA1C: No results for input(s): HGBA1C  in the last 72 hours. CBG:  Recent Labs Lab 08/25/16 1657 08/25/16 1942  08/25/16 2313 08/26/16 0423 08/26/16 0853  GLUCAP 234* 237* 248* 211* 232*   Lipid Profile: No results for input(s): CHOL, HDL, LDLCALC, TRIG, CHOLHDL, LDLDIRECT in the last 72 hours. Thyroid Function Tests: No results for input(s): TSH, T4TOTAL, FREET4, T3FREE, THYROIDAB in the last 72 hours. Anemia Panel: No results for input(s): VITAMINB12, FOLATE, FERRITIN, TIBC, IRON, RETICCTPCT in the last 72 hours. Sepsis Labs:  Recent Labs Lab 08/19/16 2250 08/20/16 0140 08/20/16 0430 08/21/16 0400 08/24/16 0529  PROCALCITON 0.92  --  1.83 1.67 0.20  LATICACIDVEN 2.1* 2.0*  --   --   --     Recent Results (from the past 240 hour(s))  MRSA PCR Screening     Status: Abnormal   Collection Time: 08/19/16 11:15 PM  Result Value Ref Range Status   MRSA by PCR POSITIVE (A) NEGATIVE Final    Comment:        The GeneXpert MRSA Assay (FDA approved for NASAL specimens only), is one component of a comprehensive MRSA colonization surveillance program. It is not intended to diagnose MRSA infection nor to guide or monitor treatment for MRSA infections. RESULT CALLED TO, READ BACK BY AND VERIFIED WITH: KOONTZ,A. RN _0  RESULT CALLED TO, READ BACK BY AND VERIFIED WITH: KOONTZ,A. RN _1  ON 9.15.17 BY MCCOY,N.          Radiology Studies: Dg Chest Port 1 View  Result Date: 08/26/2016 CLINICAL DATA:  Respiratory failure. EXAM: PORTABLE CHEST 1 VIEW COMPARISON:  08/25/2016. FINDINGS: Left PICC line noted in stable position. Heart size stable. Low lung volumes with persistent mild bibasilar infiltrates and or edema noted. No prominent pleural effusion or pneumothorax . IMPRESSION: 1. Left PICC line in stable position. 2. Persistent low lung volumes with mild bibasilar infiltrates and or edema. Electronically Signed   By: Marcello Moores  Register   On: 08/26/2016 08:27   Dg Chest Port 1 View  Result Date: 08/25/2016 CLINICAL DATA:  Respiratory failure EXAM: PORTABLE CHEST 1 VIEW COMPARISON:   August 24, 2016 FINDINGS: Endotracheal tube and nasogastric tube have been removed. Central catheter tip is at the cavoatrial junction. No pneumothorax. There is patchy opacity in the lower lung zones. Lungs elsewhere clear. Heart is mildly enlarged with pulmonary vascularity within normal limits. IMPRESSION: Patchy airspace opacity in the lower lung zones persists without change. Question pneumonia versus atypical pulmonary edema. No new opacity. Stable cardiac silhouette. No pneumothorax. Electronically Signed   By: Lowella Grip III M.D.   On: 08/25/2016 07:15        Scheduled Meds: . budesonide (PULMICORT) nebulizer solution  0.5 mg Nebulization BID  . chlorhexidine gluconate (MEDLINE KIT)  15 mL Mouth Rinse BID  . famotidine (PEPCID) IV  20 mg Intravenous Q12H  . heparin  5,000 Units Subcutaneous Q8H  . imipramine  50 mg Oral QHS  . insulin aspart  0-9 Units Subcutaneous Q4H  . insulin glargine  5 Units Subcutaneous Daily  . ipratropium-albuterol  3 mL Nebulization TID  . lamoTRIgine  100 mg Oral Daily  . levofloxacin (LEVAQUIN) IV  750 mg Intravenous Q24H  . mouth rinse  15 mL Mouth Rinse QID  . nystatin   Topical BID  . sodium chloride flush  10-40 mL Intracatheter Q12H  . venlafaxine  75 mg Per Tube BID WC   Continuous Infusions: . sodium chloride 40 mL/hr at 08/26/16 0900     LOS: 7 days  Time spent: 30 MINUTES.     Hosie Poisson, MD [facility] Pager [phone]   If 7PM-7AM, please contact night-coverage [email or web address] Password [facility] 08/26/2016, 9:53 AM

## 2016-08-26 NOTE — Progress Notes (Signed)
Inpatient Diabetes Program Recommendations  AACE/ADA: New Consensus Statement on Inpatient Glycemic Control (2015)  Target Ranges:  Prepandial:   less than 140 mg/dL      Peak postprandial:   less than 180 mg/dL (1-2 hours)      Critically ill patients:  140 - 180 mg/dL   Lab Results  Component Value Date   GLUCAP 232 (H) 08/26/2016   HGBA1C 11.2 (H) 08/19/2016    Review of Glycemic Control  Needs insulin adjustment. Pt had hypoglycemia on morning of 9/18 likely d/t Lantus 40 units. (Home dose)   Inpatient Diabetes Program Recommendations:   Please consider Lantus 15-20 units QAM Will likely need TF and/or meal coverage insulin - Novolog 3 units Q4H or if taking po's, tidwc.  Will continue to follow. Thank you. Ailene Ardshonda Jeremy Ditullio, RD, LDN, CDE Inpatient Diabetes Coordinator [phone]

## 2016-08-26 NOTE — Progress Notes (Signed)
Patient's fecal occult came back positive. MD made aware. Pt VSS will continue to monitor. No further orders at this time.

## 2016-08-26 NOTE — Evaluation (Signed)
Clinical/Bedside Swallow Evaluation Patient Details  Name: Isabel [name] MRN: [id number] Date of Birth: [birth year]  Today's Date: 08/26/2016 Time: SLP Start Time (ACUTE ONLY): 1010 SLP Stop Time (ACUTE ONLY): 1034 SLP Time Calculation (min) (ACUTE ONLY): 24 min  Past Medical History:  Past Medical History:  Diagnosis Date  . Allergy   . Asthma   . Bipolar disorder (HCC)   . COPD (chronic obstructive pulmonary disease) (HCC)   . Cystitis, interstitial   . Depression   . Diabetes mellitus without complication (HCC)   . Empyema lung (HCC)   . GERD (gastroesophageal reflux disease)   . Hypertension   . Pancreatic pseudocyst   . Pneumonia   . Post traumatic stress disorder   . Sleep apnea    wears a CPAP at night    Past Surgical History:  Past Surgical History:  Procedure Laterality Date  . CHEST SURGERY     for empyema  . CHOLECYSTECTOMY    . cystgastrostomy  09-02-12   per Dr. Cherly Hensen [facility]   . LEG SURGERY     HPI:  Isabel [name] was admitted 08/19/16 with septic shock.  PMH + for poorly controlled diabetes, Bipolar disorder, asthma/COPD, obstructive sleep apnea, hypertension, and GERD, presents with acute encephalopathy from severe DKA, acute respiratory failure with hypoxia from possible left lower lobe infiltrate on CXR and possible septic shock.    Pt was too lethargic yesterday for swallow evaluation.  Today more alert and appropriate for eval.    Assessment / Plan / Recommendation Clinical Impression  Suspect acute mild oropharyngeal dysphagia that is reversible with medical improvement.  Pt with wet voice at baseline indicative of possible laryngeal penetration of secretions.  In addition, her dentures not present therefore manipulation/"mastication" of solids was difficult and laborious.  Suspect poor lingual control resulting in premature spillage of boluses into pharynx.  No indications of airway compromise with applesauce and nectar.  Suspect aspiration with thin  water via straw and ice chip bolus - therefore recommend conservative diet intiially of dys1/nectar and follow clinically for dietary advancement.  Skilled intervention included educating pt to findings/recommendations and determining consistencies/strategies indicated.      Aspiration Risk  Mild aspiration risk    Diet Recommendation Nectar-thick liquid;Dysphagia 1 (Puree)   Liquid Administration via: Cup;Straw Medication Administration: Whole meds with puree Supervision: Full supervision/cueing for compensatory strategies Compensations: Slow rate;Small sips/bites    Other  Recommendations Oral Care Recommendations: Oral care BID Other Recommendations: Order thickener from pharmacy   Follow up Recommendations        Frequency and Duration min 2x/week  1 week       Prognosis Prognosis for Safe Diet Advancement: Fair      Swallow Study   General Date of Onset: 08/26/16 HPI: Isabel [name] was admitted 08/19/16 with septic shock.  PMH + for poorly controlled diabetes, Bipolar disorder, asthma/COPD, obstructive sleep apnea, hypertension, and GERD, presents with acute encephalopathy from severe DKA, acute respiratory failure with hypoxia from possible left lower lobe infiltrate on CXR and possible septic shock.    Pt was too lethargic yesterday for swallow evaluation.  Today more alert and appropriate for eval.  Type of Study: Bedside Swallow Evaluation Diet Prior to this Study: NPO Temperature Spikes Noted: No Respiratory Status: Room air History of Recent Intubation: No Behavior/Cognition: Alert;Requires cueing Oral Cavity Assessment: Within Functional Limits Oral Care Completed by SLP: No Oral Cavity - Dentition: Dentures, not available (dentures not present) Self-Feeding Abilities: Total assist Patient  Positioning: Upright in bed Baseline Vocal Quality: Low vocal intensity;Wet Volitional Cough: Weak Volitional Swallow: Unable to elicit    Oral/Motor/Sensory Function Overall  Oral Motor/Sensory Function: Generalized oral weakness   Ice Chips Ice chips: Impaired Presentation: Spoon Oral Phase Impairments: Reduced lingual movement/coordination Oral Phase Functional Implications: Other (comment) (suspect premature spillage into pharynx) Pharyngeal Phase Impairments: Suspected delayed Swallow;Cough - Immediate   Thin Liquid Thin Liquid: Impaired Presentation: Cup;Spoon;Straw Oral Phase Impairments: Reduced lingual movement/coordination Oral Phase Functional Implications: Other (comment) (suspect premature spillage) Pharyngeal  Phase Impairments: Cough - Immediate Other Comments: immediate overt coughing after swallow with minimal distress that resolved quickly, pt admits this happens on occasion    Nectar Thick Nectar Thick Liquid: Impaired Oral Phase Impairments: Reduced lingual movement/coordination Oral phase functional implications:  (suspect premature spillage)   Honey Thick Honey Thick Liquid: Not tested   Puree Puree: Within functional limits Presentation: Spoon   Solid   GO   Solid: Impaired Oral Phase Impairments: Reduced lingual movement/coordination;Impaired mastication Oral Phase Functional Implications: Prolonged oral transit;Oral residue (residuals adhered to soft palate) Other Comments: dentures not present at this time        Mills KollerKimball, Morris Markham Ann Damari Suastegui, MS [facility] SLP [phone]

## 2016-08-26 NOTE — Progress Notes (Signed)
PHARMACY NOTE -  Levaquin  Pharmacy has been assisting with dosing of Levaquin for PNA. Dosage remains stable at 750mg  Q24h and need for further dosage adjustment appears unlikely at present.  Patient will complete 7day course today.   Will sign off at this time.  Please reconsult if a change in clinical status warrants re-evaluation of dosage.  Junita PushMichelle Lian Pounds, PharmD, BCPS Pager: [phone] 08/26/2016@10 :00 AM

## 2016-08-26 NOTE — Progress Notes (Signed)
Pt wore his CPAP

## 2016-08-26 NOTE — Progress Notes (Signed)
Date:  August 26, 2016 Chart reviewed for concurrent status and case management needs. Will continue to follow the patient for status change:  Transferred from sdu to acute floor/remains unresponsive, elevated wbc. Discharge Planning: following for needs Expected discharge date: [id number] Marcelle SmilingRhonda Davis, BSN, [id number], [place]   [phone]

## 2016-08-27 DIAGNOSIS — F70 Mild intellectual disabilities: Secondary | ICD-10-CM

## 2016-08-27 DIAGNOSIS — D72829 Elevated white blood cell count, unspecified: Secondary | ICD-10-CM

## 2016-08-27 DIAGNOSIS — E1042 Type 1 diabetes mellitus with diabetic polyneuropathy: Secondary | ICD-10-CM

## 2016-08-27 DIAGNOSIS — I10 Essential (primary) hypertension: Secondary | ICD-10-CM

## 2016-08-27 DIAGNOSIS — J45909 Unspecified asthma, uncomplicated: Secondary | ICD-10-CM

## 2016-08-27 LAB — GLUCOSE, CAPILLARY
GLUCOSE-CAPILLARY: 280 mg/dL — AB (ref 65–99)
Glucose-Capillary: 332 mg/dL — ABNORMAL HIGH (ref 65–99)
Glucose-Capillary: 220 mg/dL — ABNORMAL HIGH (ref 65–99)
Glucose-Capillary: 204 mg/dL — ABNORMAL HIGH (ref 65–99)
Glucose-Capillary: 319 mg/dL — ABNORMAL HIGH (ref 65–99)

## 2016-08-27 LAB — CBC
HEMATOCRIT: 28 % — AB (ref 36.0–46.0)
Hemoglobin: 9.3 g/dL — ABNORMAL LOW (ref 12.0–15.0)
MCH: 26.6 pg (ref 26.0–34.0)
MCHC: 33.2 g/dL (ref 30.0–36.0)
MCV: 80.2 fL (ref 78.0–100.0)
PLATELETS: 247 10*3/uL (ref 150–400)
RBC: 3.49 MIL/uL — ABNORMAL LOW (ref 3.87–5.11)
RDW: 17 % — AB (ref 11.5–15.5)
WBC: 11.8 10*3/uL — AB (ref 4.0–10.5)

## 2016-08-27 LAB — POTASSIUM: POTASSIUM: 3.4 mmol/L — AB (ref 3.5–5.1)

## 2016-08-27 LAB — BASIC METABOLIC PANEL WITH GFR
Anion gap: 6 (ref 5–15)
BUN: 19 mg/dL (ref 6–20)
CO2: 27 mmol/L (ref 22–32)
Calcium: 8.5 mg/dL — ABNORMAL LOW (ref 8.9–10.3)
Chloride: 112 mmol/L — ABNORMAL HIGH (ref 101–111)
Creatinine, Ser: 1 mg/dL (ref 0.44–1.00)
GFR calc Af Amer: >60 mL/min (ref >60)
GFR calc non Af Amer: >60 mL/min (ref >60)
Glucose, Bld: 258 mg/dL — ABNORMAL HIGH (ref 65–99)
Potassium: 3.1 mmol/L — ABNORMAL LOW (ref 3.5–5.1)
Sodium: 145 mmol/L (ref 135–145)

## 2016-08-27 LAB — MAGNESIUM: MAGNESIUM: 1.5 mg/dL — AB (ref 1.7–2.4)

## 2016-08-27 LAB — PHOSPHORUS: Phosphorus: 3.2 mg/dL (ref 2.5–4.6)

## 2016-08-27 MED ORDER — INSULIN ASPART 100 UNIT/ML ~~LOC~~ SOLN
0.0000 [IU] | Freq: Three times a day (TID) | SUBCUTANEOUS | Status: DC
  Administered 2016-08-27: 7 [IU] via SUBCUTANEOUS

## 2016-08-27 MED ORDER — INSULIN GLARGINE 100 UNIT/ML ~~LOC~~ SOLN
25.0000 [IU] | Freq: Every day | SUBCUTANEOUS | Status: DC
  Administered 2016-08-27: 25 [IU] via SUBCUTANEOUS
  Filled 2016-08-27: qty 0.25

## 2016-08-27 MED ORDER — POTASSIUM CHLORIDE 10 MEQ/100ML IV SOLN
10.0000 meq | INTRAVENOUS | Status: AC
  Administered 2016-08-27 (×5): 10 meq via INTRAVENOUS
  Filled 2016-08-27 (×5): qty 100

## 2016-08-27 MED ORDER — MAGNESIUM OXIDE 400 MG PO TABS: 400.0000 mg | ORAL_TABLET | Freq: Every day | ORAL | 0 refills | Status: AC

## 2016-08-27 MED ORDER — RESOURCE THICKENUP CLEAR PO POWD: ORAL | 0 refills | Status: AC

## 2016-08-27 NOTE — Progress Notes (Signed)
Per nursing staff pt plans to go home with family member "Trish". This [facility] Pt has private duty aide help at home. Trish also states they have used [facility] home [facility] previously. [facility] rep contacted for referral. MD orders received. No other DC needs communicated. Sandford Crazeora Leanette Eutsler RN,BSN,NCM [phone]

## 2016-08-27 NOTE — Discharge Summary (Signed)
Physician Discharge Summary  Allina Riches HWE:[id number] DOB: [birth year] DOA: 08/19/2016  PCP: Laurey Morale, MD  Admit date: 08/19/2016 Discharge date: 08/27/2016  Time spent: 45 minutes  Recommendations for Outpatient Follow-up:  Patient will be discharged to home with home health services.  Patient will need to follow up with primary care provider within one week of discharge, repeat CBC and BMP.  Follow up closely with Dr. Loanne Drilling for diabetes management. Patient should continue medications as prescribed.  Patient should follow a dysphagia 1 diet.   Discharge Diagnoses:  Septic shock secondary to Left lower lobe infiltrate/pneumonia Acute metabolic encephalopathy Acute respiratory failure with hypoxia/chronic respiratory failure with COPD Obstructive sleep apnea Acute renal failure DKA Diabetes mellitus, type II Hypokalemia Hypernatremia Normocytic anemia Deconditioning  Discharge Condition: Stable  Diet recommendation: dysphagia 1  Filed Weights   08/26/16 0500 08/26/16 1300 08/27/16 0500  Weight: 60.3 kg (132 lb 15 oz) 63.4 kg (139 lb 12.4 oz) 62 kg (136 lb 11 oz)    History of present illness:  On 08/19/2016 by Mr. Georgann Housekeeper, NP (ICU) Isabel [name] has past medical history as below, which is significant for poorly controlled diabetes, Bipolar disorder, asthma/COPD, obstructive sleep apnea, hypertension, and GERD. Her diabetes is managed with our endocrinology, whom her caregiver contacted around 1 PM today 9/14 with complaints that the patient had been altered and laying on the floor since about 3 AM and her blood sugars have been reading as "high" on their home glucometer for the past 2 days. During that time. She had not been eating or drinking very much either. She was advised to go to the emergency department, EMS was contacted and transported her to [facility] emergency room. There, she was found to be profoundly hypertensive with leukocytosis. She was emergently  intubated for airway protection. ABG taken at that time demonstrated profound metabolic acidosis with bicarbonate less than 7. PH 6.9. Urine was positive for ketones and she was started on insulin infusion, IV fluid resuscitation, and sodium bicarbonate infusion. She was also felt to possibly be septic and was started on broad-spectrum antibiotics. Source of possible infection not entirely clear at that point. Serial ABG samples did not demonstrate much improvement despite the aforementioned therapies. Based on lack of improvement and need for critical care admission she was transferred to [facility] ICU.   Hospital Course:  Septic shock secondary to Left lower lobe infiltrate/pneumonia -Seems to have resolved. Patient was given a course of Levaquin and completed during hospitalization. -Patient did require pressors during hospitalization.  Acute metabolic encephalopathy -Secondary DKA versus infection, has improved -Spoke with family via phone, patient is back to her baseline.  Acute respiratory failure with hypoxia/chronic respiratory failure with COPD -Patient currently on room air -Patient was intubated next visit on 08/24/2016. -COPD does appear to be at baseline  Obstructive sleep apnea -Continue CPAP QHS  Acute renal failure -Likely secondary to septic shock and DKA -Resolved, adding currently 1  DKA -Resolved  Diabetes mellitus, type II -Spoke with patient's family, patient does follow with Dr. Loanne Drilling -Family would like the patient back on her home dose of insulin  Hypokalemia -Replaced, repeat BMP in 1 week -Potassium 3.4  Hypernatremia -Resolved  Normocytic anemia -Baseline hemoglobin approximately 11-13, currently 9.3 (has been stable during hospitalization)- drop may be secondary to IV fluids -FOBT was positive (?hemorrhoidal) -Anemia panel showed adequate iron and iron stores  Deconditioning -PT consult and recommended skilled nursing facility or 24-hour home  supervision -Spoke to patient's cousin,  Trish, via phone, she does take care of her cousin 24 hours per day and does have help.  Procedures/Significant Events Intubation/extubation 08/19/2016 through 08/24/2016 Placement of PICC line L UE 08/21/2016 Placement of R femoral line 08/19/16-08/21/16  Consultations: PCCM  Discharge Exam: Vitals:   08/27/16 0733 08/27/16 1258  BP:  131/79  Pulse: 88 94  Resp: 16 16  Temp:  99 F (37.2 C)     General: Well developed, elderly, NAD  HEENT: NCAT,mucous membranes moist.  Cardiovascular: S1 S2 auscultated, no murmurs, RRR  Respiratory: Clear to auscultation bilaterally with equal chest rise  Abdomen: Soft, nontender, nondistended, + bowel sounds  Extremities: warm dry without cyanosis clubbing or edema  Neuro: AAOx2, nonfocal  Discharge Instructions Discharge Instructions    Discharge instructions    Complete by:  As directed    Patient will be discharged to home with home health services.  Patient will need to follow up with primary care provider within one week of discharge, repeat CBC and BMP.  Follow up closely with Dr. Loanne Drilling for diabetes management. Patient should continue medications as prescribed.  Patient should follow a dysphagia 1 diet.     Current Discharge Medication List    START taking these medications   Details  Maltodextrin-Xanthan Gum ([place]) POWD Use as needed to thicken foods Qty: 1 Can, Refills: 0      CONTINUE these medications which have NOT CHANGED   Details  acetaminophen (TYLENOL) 325 MG tablet Take 2 tablets (650 mg total) by mouth every 6 (six) hours as needed for headache.    albuterol (PROVENTIL) (2.5 MG/3ML) 0.083% nebulizer solution Take 3 mLs (2.5 mg total) by nebulization every 6 (six) hours as needed for wheezing or shortness of breath. Qty: 75 mL, Refills: 11    Blood Glucose Monitoring Suppl (ACCU-CHEK AVIVA PLUS) w/Device KIT Use to check blood sugar 4 times per  day. Dx code. E11. Qty: 1 kit, Refills: 2    BREO ELLIPTA 200-25 MCG/INH AEPB Inhale 1 puff into the lungs daily.    cetirizine (ZYRTEC) 10 MG tablet Take 1 tablet (10 mg total) by mouth daily. For allergies Qty: 90 tablet, Refills: 3    COMBIVENT RESPIMAT 20-100 MCG/ACT AERS respimat Inhale 2 puffs into the lungs 2 (two) times daily.    !! desvenlafaxine (PRISTIQ) 100 MG 24 hr tablet Take 1 tablet by mouth two times daily. Qty: 60 tablet, Refills: 11    !! desvenlafaxine (PRISTIQ) 50 MG 24 hr tablet Take 1 tablet (50 mg total) by mouth daily. Qty: 30 tablet, Refills: 11    diazepam (VALIUM) 5 MG tablet Take 1 tablet (5 mg total) by mouth every 8 (eight) hours as needed for anxiety. Qty: 90 tablet, Refills: 5    esomeprazole (NEXIUM) 40 MG capsule TAKE 1 CAPSULE DAILY FOR ACID REFLUX Qty: 30 capsule, Refills: 6    imipramine (TOFRANIL) 50 MG tablet TAKE 1 TABLET AT BEDTIME Qty: 30 tablet, Refills: 6    lamoTRIgine (LAMICTAL) 100 MG tablet Take 1 tablet (100 mg total) by mouth daily. Qty: 30 tablet, Refills: 5    LEVEMIR FLEXTOUCH 100 UNIT/ML Pen Inject 45 Units into the skin daily with breakfast. Reported on 05/15/2016 Qty: 15 mL, Refills: 11    montelukast (SINGULAIR) 10 MG tablet TAKE 1 TABLET AT BEDTIME FOR ASTHMA Qty: 30 tablet, Refills: 6    nystatin cream (MYCOSTATIN) Apply 1 application topically 2 (two) times daily as needed for dry skin (rash). Qty: 30 g,  Refills: 0    oxybutynin (DITROPAN-XL) 10 MG 24 hr tablet TAKE 1 TABLET DAILY FOR BLADDER SPASM Qty: 30 tablet, Refills: 6    rOPINIRole (REQUIP) 1 MG tablet TAKE 1 TABLET AT BEDTIME FOR RESTLESS LEG SYNDROME Qty: 30 tablet, Refills: 6    SYMBICORT 160-4.5 MCG/ACT inhaler USE 2 PUFFS TWICE DAILY FOR ASTHMA Qty: 10.2 g, Refills: 6    VENTOLIN HFA 108 (90 Base) MCG/ACT inhaler USE 2 PUFFS EVERY 6 HOURS AS NEEDED FOR WHEEZING/SHORTNESS OF BREATH Qty: 18 g, Refills: 6    glucose blood (ACCU-CHEK AVIVA PLUS) test  strip Use to check blood sugar 4 times per day. Dx Code: E11.9 Qty: 150 each, Refills: 2    Lancets (ACCU-CHEK MULTICLIX) lancets Use to check blood sugar 4 times per day. Dx Code: E11.9 Qty: 100 each, Refills: 12    SURE COMFORT PEN NEEDLES 31G X 8 MM MISC USE AS DIRECTED Qty: 200 each, Refills: 0     !! - Potential duplicate medications found. Please discuss with provider.    STOP taking these medications     aspirin EC 81 MG tablet      mometasone (NASONEX) 50 MCG/ACT nasal spray        Allergies  Allergen Reactions  . Aspirin Hives, Shortness Of Breath, Itching and Rash    REACTION: unspecified  . Latex Hives, Shortness Of Breath and Rash    rash  . Penicillins Hives and Shortness Of Breath    Has patient had a PCN reaction causing immediate rash, facial/tongue/throat swelling, SOB or lightheadedness with hypotension: yes Has patient had a PCN reaction causing severe rash involving mucus membranes or skin necrosis: no Has patient had a PCN reaction that required hospitalization : yes Has patient had a PCN reaction occurring within the last 10 years: yes, 6 years ago If all of the above answers are "NO", then may proceed with Cephalosporin use.   . Sulfamethoxazole Hives and Shortness Of Breath    REACTION: unspecified  . Metformin And Related Hives and Diarrhea    combative  . Iodine Hives and Rash  . Povidone-Iodine Hives, Itching and Rash    REACTION: unspecified   Follow-up Information    FRY,STEPHEN A, MD. Schedule an appointment as soon as possible for a visit in 1 week(s).   Specialty:  Family Medicine Why:  Hospital follow up Contact information: [place] [address] [phone]        Renato Shin, MD .   Specialty:  Endocrinology Why:  Diabetes management Contact information: [address] [place] [address] [phone]            The results of significant diagnostics from this hospitalization  (including imaging, microbiology, ancillary and laboratory) are listed below for reference.    Significant Diagnostic Studies: Dg Chest Port 1 View  Result Date: 08/26/2016 CLINICAL DATA:  Respiratory failure. EXAM: PORTABLE CHEST 1 VIEW COMPARISON:  08/25/2016. FINDINGS: Left PICC line noted in stable position. Heart size stable. Low lung volumes with persistent mild bibasilar infiltrates and or edema noted. No prominent pleural effusion or pneumothorax . IMPRESSION: 1. Left PICC line in stable position. 2. Persistent low lung volumes with mild bibasilar infiltrates and or edema. Electronically Signed   By: Marcello Moores  Register   On: 08/26/2016 08:27   Dg Chest Port 1 View  Result Date: 08/25/2016 CLINICAL DATA:  Respiratory failure EXAM: PORTABLE CHEST 1 VIEW COMPARISON:  August 24, 2016 FINDINGS: Endotracheal tube  and nasogastric tube have been removed. Central catheter tip is at the cavoatrial junction. No pneumothorax. There is patchy opacity in the lower lung zones. Lungs elsewhere clear. Heart is mildly enlarged with pulmonary vascularity within normal limits. IMPRESSION: Patchy airspace opacity in the lower lung zones persists without change. Question pneumonia versus atypical pulmonary edema. No new opacity. Stable cardiac silhouette. No pneumothorax. Electronically Signed   By: Lowella Grip III M.D.   On: 08/25/2016 07:15   Dg Chest Port 1 View  Result Date: 08/24/2016 CLINICAL DATA:  Respiratory failure EXAM: PORTABLE CHEST 1 VIEW COMPARISON:  08/23/2016 FINDINGS: Endotracheal tube in good position. Left arm PICC tip at the cavoatrial junction unchanged. NG tube in the stomach. Patchy bibasilar airspace disease left greater than right is unchanged. No edema or effusion. IMPRESSION: Endotracheal tube remains in good position. Bibasilar airspace disease unchanged. Electronically Signed   By: Franchot Gallo M.D.   On: 08/24/2016 07:15   Dg Chest Port 1 View  Result Date:  08/23/2016 CLINICAL DATA:  Hypoxia. EXAM: PORTABLE CHEST 1 VIEW COMPARISON:  08/22/2016. FINDINGS: Endotracheal tube, NG tube, left PICC line stable position. Heart size stable. Left lower lobe infiltrate suggesting pneumonia noted. Low lung volumes with bibasilar atelectasis. No pleural effusion or pneumothorax. IMPRESSION: 1. Lines and tubes in stable position. 2. Left lower lobe infiltrate consistent with pneumonia. 3. Low lung volumes with bibasilar atelectasis. Electronically Signed   By: Marcello Moores  Register   On: 08/23/2016 06:41   Dg Chest Port 1 View  Result Date: 08/22/2016 CLINICAL DATA:  60 y/o  F; acute respiratory failure. EXAM: PORTABLE CHEST 1 VIEW COMPARISON:  08/21/2016 chest radiograph. FINDINGS: Endotracheal tube is stable in position. Enteric tube tip below the field of view in the abdomen. Right upper quadrant surgical clips noted. Hazy opacification of left lung base probably represents atelectasis and is stable. No new focal consolidation, pneumothorax, or pleural effusion. No acute osseous abnormality. Left PICC line tip projects over lower SVC. IMPRESSION: Stable lines and tubes. Left basilar atelectasis. No significant interval change. Electronically Signed   By: Kristine Garbe M.D.   On: 08/22/2016 04:36   Dg Chest Port 1 View  Result Date: 08/21/2016 CLINICAL DATA:  Acute respiratory failure EXAM: PORTABLE CHEST 1 VIEW COMPARISON:  08/19/2016 FINDINGS: Endotracheal tube and NG tube unchanged. Introduction of LEFT central venous line with tip in distal SVC. LEFT basilar atelectasis and scarring not changed. No pulmonary edema. No infiltrate. IMPRESSION: 1. Stable support apparatus. 2. Introduction of LEFT PICC line with tip in the distal SVC. 3. LEFT basilar atelectasis. Electronically Signed   By: Suzy Bouchard M.D.   On: 08/21/2016 07:22   Dg Chest Port 1 View  Result Date: 08/20/2016 CLINICAL DATA:  Acute hypoxic respiratory failure. EXAM: PORTABLE CHEST 1 VIEW  COMPARISON:  08/19/2016 at 1448 hours FINDINGS: Endotracheal tube terminates at the level of the clavicular heads. Enteric tube terminates over the distal esophagus near the GE junction. The cardiomediastinal silhouette is within normal limits. Lung volumes are mildly diminished, and there is increased patchy opacity in the left lung base. No sizable pleural effusion or pneumothorax is identified. IMPRESSION: 1. Increased left basilar opacities which may reflect atelectasis or pneumonia. 2. Enteric tube tip at the distal esophagus/GE junction. Recommend advancement. Electronically Signed   By: Logan Bores M.D.   On: 08/20/2016 00:09    Microbiology: Recent Results (from the past 240 hour(s))  MRSA PCR Screening     Status: Abnormal   Collection  Time: 08/19/16 11:15 PM  Result Value Ref Range Status   MRSA by PCR POSITIVE (A) NEGATIVE Final    Comment:        The GeneXpert MRSA Assay (FDA approved for NASAL specimens only), is one component of a comprehensive MRSA colonization surveillance program. It is not intended to diagnose MRSA infection nor to guide or monitor treatment for MRSA infections. RESULT CALLED TO, READ BACK BY AND VERIFIED WITH: KOONTZ,A. RN @01910  RESULT CALLED TO, READ BACK BY AND VERIFIED WITH: KOONTZ,A. RN @0910  ON 9.15.17 BY MCCOY,N.      Labs: Basic Metabolic Panel:  Recent Labs Lab 08/21/16 0400  08/23/16 0545 08/24/16 0529 08/24/16 1657 08/25/16 0430 08/26/16 0630 08/27/16 0430 08/27/16 1400  NA 133*  < > 141 135  --  145 149* 145  --   K 4.0  < > 3.5 4.0  --  3.8 3.1* 3.1* 3.4*  CL 113*  < > 118* 109  --  115* 114* 112*  --   CO2 14*  < > 18* 18*  --  21* 24 27  --   GLUCOSE 156*  < > 60* 431* 372* 272* 253* 258*  --   BUN 52*  < > 35* 29*  --  30* 26* 19  --   CREATININE 1.47*  < > 1.24* 1.25*  --  1.24* 1.23* 1.00  --   CALCIUM 8.0*  < > 8.9 8.2*  --  8.5* 8.9 8.5*  --   MG 2.0  --  1.6* 1.5*  --   --  1.9 1.5*  --   PHOS 1.2*  --  1.5* 3.1   --   --  3.4 3.2  --   < > = values in this interval not displayed. Liver Function Tests:  Recent Labs Lab 08/21/16 0400  ALBUMIN 2.3*   No results for input(s): LIPASE, AMYLASE in the last 168 hours. No results for input(s): AMMONIA in the last 168 hours. CBC:  Recent Labs Lab 08/23/16 0545 08/24/16 0529 08/25/16 0430 08/26/16 0630 08/27/16 0430  WBC 14.2* 8.6 10.3 12.8* 11.8*  HGB 9.7* 9.4* 9.2* 9.9* 9.3*  HCT 26.8* 26.4* 26.7* 29.4* 28.0*  MCV 75.1* 74.8* 77.8* 79.2 80.2  PLT 99* 101* 158 251 247   Cardiac Enzymes: No results for input(s): CKTOTAL, CKMB, CKMBINDEX, TROPONINI in the last 168 hours. BNP: BNP (last 3 results) No results for input(s): BNP in the last 8760 hours.  ProBNP (last 3 results) No results for input(s): PROBNP in the last 8760 hours.  CBG:  Recent Labs Lab 08/26/16 2010 08/27/16 0008 08/27/16 0416 08/27/16 0737 08/27/16 1140  GLUCAP 332* 280* 220* 204* 319*       Signed:  Cristal Ford  [facility] 08/27/2016, 2:39 PM

## 2016-08-27 NOTE — Progress Notes (Signed)
Discharge instructions reviewed with patient and cousin Rosann Auerbachrish.  Questions answered, verbalized understanding.  Patient transported via wheelchair to front of hospital to be taken home by Trish.  Site where picc line removed was dry and intact upon discharge.

## 2016-08-27 NOTE — Progress Notes (Signed)
Physical Therapy Treatment Patient Details Name: Isabel [name] MRN: [id number] DOB: 1956/09/10 Today's Date: 08/27/2016    History of Present Illness 60 yo female with history of poorly controlled diabetes, Bipolar disorder, asthma/COPD, obstructive sleep apnea, hypertension, and GERD. She was admitted from [facility] 08/06/16 with AMS, hypertension and hypergycemia.. Intubated immediately. Extubated 08/24/16.    PT Comments    Pt with improved participation, cognition, and mobility during this session. No family present. Pt required Min assist to walk ~45 feet with a RW. She remains at high risk for falls even with use of a walker. RN reports family would like pt to return home. If pt returns home, she will require 24 hour care and caregiver will need to be able to provide at least Min assist.    Follow Up Recommendations  SNF;Supervision/Assistance - 24 hour (HHPT only if caregiver can provide current level of care and 24 hour supervision/assist)     Equipment Recommendations  None recommended by PT (pt stated she has a walker at home already)    Recommendations for Other Services       Precautions / Restrictions Precautions Precautions: Fall Restrictions Weight Bearing Restrictions: No    Mobility  Bed Mobility Overal bed mobility: Needs Assistance Bed Mobility: Supine to Sit     Supine to sit: Min assist     General bed mobility comments: small amount of assist to get to EOB. VCs safety, technique.  Transfers Overall transfer level: Needs assistance Equipment used: Rolling walker (2 wheeled) Transfers: Sit to/from [employer] Sit to Stand: Min assist Stand pivot transfers: Min assist       General transfer comment: Assist to rise, stabilize, control descent. Stand pivot, bed to bsc, with RW. VCs safety, technique.   Ambulation/Gait Ambulation/Gait assistance: Min assist Ambulation Distance (Feet): 45 Feet Assistive device: Rolling  walker (2 wheeled) Gait Pattern/deviations: Step-through pattern;Decreased stride length     General Gait Details: Assist to stabilize pt and maneuver safely with RW. Pt repeatedly veered towards L side somtimes colliding with objects in environment. LOB x 2.    Stairs            Wheelchair Mobility    Modified Rankin (Stroke Patients Only)       Balance Overall balance assessment: Needs assistance         Standing balance support: Bilateral upper extremity supported Standing balance-Leahy Scale: Poor                      Cognition Arousal/Alertness: Awake/alert Behavior During Therapy: WFL for tasks assessed/performed Overall Cognitive Status: History of cognitive impairments - at baseline                      Exercises      General Comments        Pertinent Vitals/Pain Faces Pain Scale: No hurt    Home Living                      Prior Function            PT Goals (current goals can now be found in the care plan section) Progress towards PT goals: Progressing toward goals    Frequency    Min 3X/week      PT Plan Current plan remains appropriate    Co-evaluation             End of Session Equipment Utilized During Treatment:  Gait belt Activity Tolerance: Patient tolerated treatment well Patient left: in chair;with call bell/phone within reach;with chair alarm set     Time: 1119-1141 PT Time Calculation (min) (ACUTE ONLY): 22 min  Charges:  $Gait Training: 8-22 mins                    G Codes:      Isabel [name], MPT Pager: [phone]

## 2016-08-27 NOTE — Care Management Important Message (Signed)
Important Message  Patient Details IM Letter given to Nora/Case Manager to present to Patient Name: Isabel [name] MRN: [id number] Date of Birth: [birth year]   Medicare Important Message Given:  Yes    Haskell FlirtJamison, Rishav Rockefeller 08/27/2016, 9:24 AMImportant Message  Patient Details  Name: Isabel [name] MRN: [id number] Date of Birth: [birth year]   Medicare Important Message Given:  Yes    Haskell FlirtJamison, Anayla Giannetti 08/27/2016, 9:24 AM

## 2016-08-27 NOTE — Discharge Instructions (Signed)
Respiratory failure is when your lungs are not working well and your breathing (respiratory) system fails. When respiratory failure occurs, it is difficult for your lungs to get enough oxygen, get rid of carbon dioxide, or both. Respiratory failure can be life threatening.  °Respiratory failure can be acute or chronic. Acute respiratory failure is sudden, severe, and requires emergency medical treatment. Chronic respiratory failure is less severe, happens over time, and requires ongoing treatment.  °WHAT ARE THE CAUSES OF ACUTE RESPIRATORY FAILURE?  °Any problem affecting the heart or lungs can cause acute respiratory failure. Some of these causes include the following: °· Chronic bronchitis and emphysema (COPD).   °· Blood clot going to a lung (pulmonary embolism).   °· Having water in the lungs caused by heart failure, lung injury, or infection (pulmonary edema).   °· Collapsed lung (pneumothorax).   °· Pneumonia.   °· Pulmonary fibrosis.   °· Obesity.   °· Asthma.   °· Heart failure.   °· Any type of trauma to the chest that can make breathing difficult.   °· Nerve or muscle diseases making chest movements difficult. °HOW WILL MY ACUTE RESPIRATORY FAILURE BE TREATED?  °Treatment of acute respiratory failure depends on the cause of the respiratory failure. Usually, you will stay in the intensive care unit so your breathing can be watched closely. Treatment can include the following: °· Oxygen. Oxygen can be delivered through the following: °¨ Nasal cannula. This is small tubing that goes in your nose to give you oxygen. °¨ Face mask. A face mask covers your nose and mouth to give you oxygen. °· Medicine. Different medicines can be given to help with breathing. These can include: °¨ Nebulizers. Nebulizers deliver medicines to open the air passages (bronchodilators). These medicines help to open or relax the airways in the lungs so you can breathe better. They can also help loosen mucus from your  lungs. °¨ Diuretics. Diuretic medicines can help you breathe better by getting rid of extra water in your body. °¨ Steroids. Steroid medicines can help decrease swelling (inflammation) in your lungs. °¨ Antibiotics. °· Chest tube. If you have a collapsed lung (pneumothorax), a chest tube is placed to help reinflate the lung. °· Noninvasive positive pressure ventilation (NPPV). This is a tight-fitting mask that goes over your nose and mouth. The mask has tubing that is attached to a machine. The machine blows air into the tubing, which helps to keep the tiny air sacs (alveoli) in your lungs open. This machine allows you to breathe on your own. °· Ventilator. A ventilator is a breathing machine. When on a ventilator, a breathing tube is put into the lungs. A ventilator is used when you can no longer breathe well enough on your own. You may have low oxygen levels or high carbon dioxide (CO2) levels in your blood. When you are on a ventilator, sedation and pain medicines are given to make you sleep so your lungs can heal. °SEEK IMMEDIATE MEDICAL CARE IF: °· You have shortness of breath (dyspnea) with or without activity. °· You have rapid breathing (tachypnea). °· You are wheezing. °· You are unable to say more than a few words without having to catch your breath. °· You find it very difficult to function normally. °· You have a fast heart rate. °· You have a bluish color to your finger or toe nail beds. °· You have confusion or drowsiness or both. °  °This information is not intended to replace advice given to you by your health care provider. Make sure you discuss   any questions you have with your health care provider. °  °Document Released: 11/27/2013 Document Revised: 08/13/2015 Document Reviewed: 11/27/2013 °Elsevier Interactive Patient Education ©2016 Elsevier Inc. ° °

## 2016-08-27 NOTE — Progress Notes (Signed)
Nutrition Follow-up  DOCUMENTATION CODES:   Not applicable  INTERVENTION:  Provided "Pureed Foods Nutrition Therapy" handout and "High Calorie, High-Protein Nutrition Therapy" handout from the Academy of Nutrition and Dietetics.   Encouraged patient and caretaker to ensure that all foods eaten are pureed and that all liquids are thickened to appropriate consistency for the safety of the patient.    NUTRITION DIAGNOSIS:   Inadequate oral intake related to inability to eat as evidenced by NPO status.  Improved. Patient no longer NPO and with good intake.   GOAL:   Patient will meet greater than or equal to 90% of their needs  Met.   MONITOR:   Diet advancement, Weight trends, Labs, Skin, I & O's  REASON FOR ASSESSMENT:   Consult Enteral/tube feeding initiation and management  ASSESSMENT:   60 year-old with past medical history significant for poorly controlled diabetes, bipolar disorder, asthma/COPD, obstructive sleep apnea, hypertension, and GERD. Her diabetes is managed with our endocrinology, whom her caregiver contacted around 1 PM today 9/14 with complaints that the patient had been altered and laying on the floor since about 3 AM and her blood sugars have been reading as "high" on their home glucometer for the past 2 days. During that time she had not been eating or drinking very much. EMS transported her to [facility] emergency room where she was found to be profoundly hypertensive with leukocytosis. She was emergently intubated for airway protection. ABG taken at that time demonstrated profound metabolic acidosis with bicarbonate less than 7. Based on lack of improvement and need for critical care admission she was transferred to [facility] ICU.  Pt advanced to Dysphagia 1 diet with nectar-thick liquids on 9/21 per SLP recommendations.   Patient was getting ready to discharge at time of assessment. Her caretaker was also present. They were concerned that the thickened  liquids are harder for her to swallow. Discussed that the Dysphagia 1 diet with Nectar-Thick liquids is for the patient's safety since she is having dysphagia. Caretaker asked, "Do I really have to puree all of her foods?" Discussed the importance of doing this for the patient's safety. Worried there might be poor compliance at home.   Meal Completion: 100% per chart and patient. In the past 24 hrs pt has had 1784 kcal (100% needs) and 79 grams protein (100% needs).   Medications reviewed and include: famotidine, Novolog sliding scale TID with meals, Lantus 25 units daily, NS @ 40 ml/hr.   Labs reviewed: CBG 204-378 past 24 hrs, Potassium 3.4, Magnesium 1.5, Phosphorus WNL.   Discussed plan with RN. Patient to discharge home today with caretaker.   Diet Order:  DIET - DYS 1 Room service appropriate? Yes with Assist; Fluid consistency: Nectar Thick  Skin:  Reviewed, no issues  Last BM:  9/20  Height:   Ht Readings from Last 1 Encounters:  08/26/16 _0  (1.651 m)    Weight:   Wt Readings from Last 1 Encounters:  08/27/16 136 lb 11 oz (62 kg)    Ideal Body Weight:  56.82 kg  BMI:  Body mass index is 22.75 kg/m.  Estimated Nutritional Needs:   Kcal:  1350-1550  Protein:  50-60 grams  Fluid:  >/= 1.5 L/day  EDUCATION NEEDS:   No education needs identified at this time  Willey Blade, MS, RD, LDN Pager: [phone] After Hours Pager: [phone]

## 2016-08-28 DIAGNOSIS — J45909 Unspecified asthma, uncomplicated: Secondary | ICD-10-CM | POA: Diagnosis not present

## 2016-08-28 DIAGNOSIS — E1165 Type 2 diabetes mellitus with hyperglycemia: Secondary | ICD-10-CM | POA: Diagnosis not present

## 2016-08-28 DIAGNOSIS — F319 Bipolar disorder, unspecified: Secondary | ICD-10-CM

## 2016-08-28 DIAGNOSIS — F0390 Unspecified dementia without behavioral disturbance: Secondary | ICD-10-CM

## 2016-08-28 DIAGNOSIS — Z8701 Personal history of pneumonia (recurrent): Secondary | ICD-10-CM

## 2016-08-28 DIAGNOSIS — J449 Chronic obstructive pulmonary disease, unspecified: Secondary | ICD-10-CM | POA: Diagnosis not present

## 2016-08-28 DIAGNOSIS — R1310 Dysphagia, unspecified: Secondary | ICD-10-CM | POA: Diagnosis not present

## 2016-08-28 DIAGNOSIS — Z794 Long term (current) use of insulin: Secondary | ICD-10-CM

## 2016-08-28 DIAGNOSIS — I10 Essential (primary) hypertension: Secondary | ICD-10-CM | POA: Diagnosis not present

## 2016-08-30 ENCOUNTER — Telehealth: Payer: Self-pay | Admitting: Family Medicine

## 2016-08-30 DIAGNOSIS — F0390 Unspecified dementia without behavioral disturbance: Secondary | ICD-10-CM | POA: Diagnosis not present

## 2016-08-30 DIAGNOSIS — E1165 Type 2 diabetes mellitus with hyperglycemia: Secondary | ICD-10-CM | POA: Diagnosis not present

## 2016-08-30 DIAGNOSIS — J45909 Unspecified asthma, uncomplicated: Secondary | ICD-10-CM | POA: Diagnosis not present

## 2016-08-30 DIAGNOSIS — R1310 Dysphagia, unspecified: Secondary | ICD-10-CM | POA: Diagnosis not present

## 2016-08-30 DIAGNOSIS — I10 Essential (primary) hypertension: Secondary | ICD-10-CM | POA: Diagnosis not present

## 2016-08-30 DIAGNOSIS — J449 Chronic obstructive pulmonary disease, unspecified: Secondary | ICD-10-CM | POA: Diagnosis not present

## 2016-08-30 NOTE — Telephone Encounter (Signed)
I spoke with [facility] and they have requested a Child psychotherapistsocial worker to evaluate patient. I did give this information to Dr. Clent RidgesFry.

## 2016-08-30 NOTE — Telephone Encounter (Signed)
Antonio with [facility] would like PT orders for  2 wk/ 6  Verbal ok

## 2016-08-30 NOTE — Telephone Encounter (Signed)
° °  Cindy from [facility] call to ask for a call back. She said she has some concerns that she would like to discuss with the nurse    [phone]

## 2016-08-31 DIAGNOSIS — F0390 Unspecified dementia without behavioral disturbance: Secondary | ICD-10-CM | POA: Diagnosis not present

## 2016-08-31 DIAGNOSIS — I10 Essential (primary) hypertension: Secondary | ICD-10-CM | POA: Diagnosis not present

## 2016-08-31 DIAGNOSIS — J45909 Unspecified asthma, uncomplicated: Secondary | ICD-10-CM | POA: Diagnosis not present

## 2016-08-31 DIAGNOSIS — J449 Chronic obstructive pulmonary disease, unspecified: Secondary | ICD-10-CM | POA: Diagnosis not present

## 2016-08-31 DIAGNOSIS — E1165 Type 2 diabetes mellitus with hyperglycemia: Secondary | ICD-10-CM | POA: Diagnosis not present

## 2016-08-31 DIAGNOSIS — R1310 Dysphagia, unspecified: Secondary | ICD-10-CM | POA: Diagnosis not present

## 2016-08-31 NOTE — Telephone Encounter (Signed)
Orders okay? 

## 2016-08-31 NOTE — Telephone Encounter (Signed)
Per Dr. Clent RidgesFry okay to give verbal orders and [facility] and gave verbal orders for below request.

## 2016-09-01 DIAGNOSIS — J449 Chronic obstructive pulmonary disease, unspecified: Secondary | ICD-10-CM | POA: Diagnosis not present

## 2016-09-01 DIAGNOSIS — R1310 Dysphagia, unspecified: Secondary | ICD-10-CM | POA: Diagnosis not present

## 2016-09-01 DIAGNOSIS — J45909 Unspecified asthma, uncomplicated: Secondary | ICD-10-CM | POA: Diagnosis not present

## 2016-09-01 DIAGNOSIS — E1165 Type 2 diabetes mellitus with hyperglycemia: Secondary | ICD-10-CM | POA: Diagnosis not present

## 2016-09-01 DIAGNOSIS — F0390 Unspecified dementia without behavioral disturbance: Secondary | ICD-10-CM | POA: Diagnosis not present

## 2016-09-01 DIAGNOSIS — I10 Essential (primary) hypertension: Secondary | ICD-10-CM | POA: Diagnosis not present

## 2016-09-02 DIAGNOSIS — J449 Chronic obstructive pulmonary disease, unspecified: Secondary | ICD-10-CM | POA: Diagnosis not present

## 2016-09-02 DIAGNOSIS — J45909 Unspecified asthma, uncomplicated: Secondary | ICD-10-CM | POA: Diagnosis not present

## 2016-09-02 DIAGNOSIS — I10 Essential (primary) hypertension: Secondary | ICD-10-CM | POA: Diagnosis not present

## 2016-09-02 DIAGNOSIS — R1310 Dysphagia, unspecified: Secondary | ICD-10-CM | POA: Diagnosis not present

## 2016-09-02 DIAGNOSIS — E1165 Type 2 diabetes mellitus with hyperglycemia: Secondary | ICD-10-CM | POA: Diagnosis not present

## 2016-09-02 DIAGNOSIS — F0390 Unspecified dementia without behavioral disturbance: Secondary | ICD-10-CM | POA: Diagnosis not present

## 2016-09-06 DIAGNOSIS — I10 Essential (primary) hypertension: Secondary | ICD-10-CM | POA: Diagnosis not present

## 2016-09-06 DIAGNOSIS — F0390 Unspecified dementia without behavioral disturbance: Secondary | ICD-10-CM | POA: Diagnosis not present

## 2016-09-06 DIAGNOSIS — R1310 Dysphagia, unspecified: Secondary | ICD-10-CM | POA: Diagnosis not present

## 2016-09-06 DIAGNOSIS — J449 Chronic obstructive pulmonary disease, unspecified: Secondary | ICD-10-CM | POA: Diagnosis not present

## 2016-09-06 DIAGNOSIS — J45909 Unspecified asthma, uncomplicated: Secondary | ICD-10-CM | POA: Diagnosis not present

## 2016-09-06 DIAGNOSIS — E1165 Type 2 diabetes mellitus with hyperglycemia: Secondary | ICD-10-CM | POA: Diagnosis not present

## 2016-09-07 ENCOUNTER — Ambulatory Visit (INDEPENDENT_AMBULATORY_CARE_PROVIDER_SITE_OTHER): Payer: Medicare Other | Admitting: Family Medicine

## 2016-09-07 ENCOUNTER — Encounter: Payer: Self-pay | Admitting: Family Medicine

## 2016-09-07 VITALS — BP 138/66 | HR 89 | Temp 97.8°F | Ht 65.0 in | Wt 126.0 lb

## 2016-09-07 DIAGNOSIS — R6521 Severe sepsis with septic shock: Secondary | ICD-10-CM

## 2016-09-07 DIAGNOSIS — J4520 Mild intermittent asthma, uncomplicated: Secondary | ICD-10-CM

## 2016-09-07 DIAGNOSIS — J181 Lobar pneumonia, unspecified organism: Secondary | ICD-10-CM

## 2016-09-07 DIAGNOSIS — I10 Essential (primary) hypertension: Secondary | ICD-10-CM

## 2016-09-07 DIAGNOSIS — A419 Sepsis, unspecified organism: Secondary | ICD-10-CM

## 2016-09-07 DIAGNOSIS — E1042 Type 1 diabetes mellitus with diabetic polyneuropathy: Secondary | ICD-10-CM

## 2016-09-07 DIAGNOSIS — G9340 Encephalopathy, unspecified: Secondary | ICD-10-CM

## 2016-09-07 DIAGNOSIS — J9601 Acute respiratory failure with hypoxia: Secondary | ICD-10-CM

## 2016-09-07 DIAGNOSIS — Z23 Encounter for immunization: Secondary | ICD-10-CM

## 2016-09-07 DIAGNOSIS — J189 Pneumonia, unspecified organism: Secondary | ICD-10-CM

## 2016-09-07 LAB — CBC WITH DIFFERENTIAL/PLATELET
BASOS ABS: 0 10*3/uL (ref 0.0–0.1)
BASOS PCT: 0.4 % (ref 0.0–3.0)
Eosinophils Absolute: 0 10*3/uL (ref 0.0–0.7)
Eosinophils Relative: 0.5 % (ref 0.0–5.0)
HEMATOCRIT: 31 % — AB (ref 36.0–46.0)
HEMOGLOBIN: 10.3 g/dL — AB (ref 12.0–15.0)
LYMPHS PCT: 15.5 % (ref 12.0–46.0)
Lymphs Abs: 1.2 10*3/uL (ref 0.7–4.0)
MCHC: 33.2 g/dL (ref 30.0–36.0)
MCV: 79.3 fl (ref 78.0–100.0)
MONOS PCT: 8.5 % (ref 3.0–12.0)
Monocytes Absolute: 0.7 10*3/uL (ref 0.1–1.0)
Neutro Abs: 5.9 10*3/uL (ref 1.4–7.7)
Neutrophils Relative %: 75.1 % (ref 43.0–77.0)
Platelets: 427 10*3/uL — ABNORMAL HIGH (ref 150.0–400.0)
RBC: 3.91 Mil/uL (ref 3.87–5.11)
RDW: 14.7 % (ref 11.5–15.5)
WBC: 7.8 10*3/uL (ref 4.0–10.5)

## 2016-09-07 LAB — BASIC METABOLIC PANEL
BUN: 14 mg/dL (ref 6–23)
CHLORIDE: 98 meq/L (ref 96–112)
CO2: 34 meq/L — AB (ref 19–32)
CREATININE: 0.74 mg/dL (ref 0.40–1.20)
Calcium: 9.2 mg/dL (ref 8.4–10.5)
GFR: 85.56 mL/min (ref >60.00)
GLUCOSE: 153 mg/dL — AB (ref 70–99)
Potassium: 3.3 mEq/L — ABNORMAL LOW (ref 3.5–5.1)
Sodium: 140 mEq/L (ref 135–145)

## 2016-09-07 NOTE — Addendum Note (Signed)
Addended by: Aniceto BossNIMMONS, Horrace Hanak A on: 09/07/2016 01:48 PM   Modules accepted: Orders

## 2016-09-07 NOTE — Addendum Note (Signed)
Addended by: Gershon CraneFRY, STEPHEN A on: 09/07/2016 12:22 PM   Modules accepted: Orders

## 2016-09-07 NOTE — Progress Notes (Signed)
Pre visit review using our clinic review tool, if applicable. No additional management support is needed unless otherwise documented below in the visit note. 

## 2016-09-07 NOTE — Progress Notes (Signed)
   Subjective:    Patient ID: Isabel [name] Isabel [name], female    DOB: 04-29-1956, 60 y.o.   MRN: [id number]  HPI Here with her cousin to follow up a hospital stay from 08-19-16 to 08-27-16 for a LLL pneumonia that led to septic shock, encephalopathy, respiratory failure, and renal failure. She was intubated and treated with IV antibiotics that were later switched to oral Levaquin. Her respiratory status improved and she was extubated. Her renal function improved. She was sent home on her usual medications and her usual insulin regimen. She feels  much better now although she is weak. She has lost some weight. She was supposed to be on a pureed diet but she has actually been taking a soft diet lately. No trouble swallowing this. No SOB. Her am fasting glucoses have been stable in the range of 91 to 105. She is getting PT twice a week.    Review of Systems  Constitutional: Positive for fatigue. Negative for chills, diaphoresis and fever.  Respiratory: Negative.   Cardiovascular: Negative.   Gastrointestinal: Negative.   Genitourinary: Negative.   Neurological: Negative.        Objective:   Physical Exam  Constitutional: She is oriented to person, place, and time. She appears well-developed and well-nourished. No distress.  Neck: No thyromegaly present.  Cardiovascular: Normal rate, regular rhythm, normal heart sounds and intact distal pulses.   Pulmonary/Chest: Effort normal and breath sounds normal. No respiratory distress. She has no wheezes. She has no rales.  Lymphadenopathy:    She has no cervical adenopathy.  Neurological: She is alert and oriented to person, place, and time.          Assessment & Plan:  She is recovering from a recent pneumonia causing encephalopathy and respiratory and renal failure. She has improved quite a bit. We  will get a CBC and BMET today. Continue PT. Her HTN and DM are stable.  Nelwyn SalisburyFRY,Safiyyah Vasconez A, MD

## 2016-09-08 ENCOUNTER — Other Ambulatory Visit: Payer: Self-pay | Admitting: Family Medicine

## 2016-09-08 DIAGNOSIS — F0390 Unspecified dementia without behavioral disturbance: Secondary | ICD-10-CM | POA: Diagnosis not present

## 2016-09-08 DIAGNOSIS — R1310 Dysphagia, unspecified: Secondary | ICD-10-CM | POA: Diagnosis not present

## 2016-09-08 DIAGNOSIS — J45909 Unspecified asthma, uncomplicated: Secondary | ICD-10-CM | POA: Diagnosis not present

## 2016-09-08 DIAGNOSIS — E1165 Type 2 diabetes mellitus with hyperglycemia: Secondary | ICD-10-CM | POA: Diagnosis not present

## 2016-09-08 DIAGNOSIS — I10 Essential (primary) hypertension: Secondary | ICD-10-CM | POA: Diagnosis not present

## 2016-09-08 DIAGNOSIS — J449 Chronic obstructive pulmonary disease, unspecified: Secondary | ICD-10-CM | POA: Diagnosis not present

## 2016-09-08 MED ORDER — POTASSIUM CHLORIDE ER 10 MEQ PO TBCR: 10.0000 meq | EXTENDED_RELEASE_TABLET | Freq: Every day | ORAL | 5 refills | Status: DC

## 2016-09-10 ENCOUNTER — Telehealth: Payer: Self-pay | Admitting: Family Medicine

## 2016-09-10 DIAGNOSIS — E1165 Type 2 diabetes mellitus with hyperglycemia: Secondary | ICD-10-CM | POA: Diagnosis not present

## 2016-09-10 DIAGNOSIS — F0390 Unspecified dementia without behavioral disturbance: Secondary | ICD-10-CM | POA: Diagnosis not present

## 2016-09-10 DIAGNOSIS — J45909 Unspecified asthma, uncomplicated: Secondary | ICD-10-CM | POA: Diagnosis not present

## 2016-09-10 DIAGNOSIS — I10 Essential (primary) hypertension: Secondary | ICD-10-CM | POA: Diagnosis not present

## 2016-09-10 DIAGNOSIS — J449 Chronic obstructive pulmonary disease, unspecified: Secondary | ICD-10-CM | POA: Diagnosis not present

## 2016-09-10 DIAGNOSIS — R1310 Dysphagia, unspecified: Secondary | ICD-10-CM | POA: Diagnosis not present

## 2016-09-10 NOTE — Telephone Encounter (Signed)
Isabel [name] would like to Initiate therapy next week due to her being unable to reach the caregiver.

## 2016-09-13 DIAGNOSIS — I10 Essential (primary) hypertension: Secondary | ICD-10-CM | POA: Diagnosis not present

## 2016-09-13 DIAGNOSIS — R1310 Dysphagia, unspecified: Secondary | ICD-10-CM | POA: Diagnosis not present

## 2016-09-13 DIAGNOSIS — E1165 Type 2 diabetes mellitus with hyperglycemia: Secondary | ICD-10-CM | POA: Diagnosis not present

## 2016-09-13 DIAGNOSIS — J45909 Unspecified asthma, uncomplicated: Secondary | ICD-10-CM | POA: Diagnosis not present

## 2016-09-13 DIAGNOSIS — F0390 Unspecified dementia without behavioral disturbance: Secondary | ICD-10-CM | POA: Diagnosis not present

## 2016-09-13 DIAGNOSIS — J449 Chronic obstructive pulmonary disease, unspecified: Secondary | ICD-10-CM | POA: Diagnosis not present

## 2016-09-14 DIAGNOSIS — J449 Chronic obstructive pulmonary disease, unspecified: Secondary | ICD-10-CM | POA: Diagnosis not present

## 2016-09-14 DIAGNOSIS — R1310 Dysphagia, unspecified: Secondary | ICD-10-CM | POA: Diagnosis not present

## 2016-09-14 DIAGNOSIS — F0390 Unspecified dementia without behavioral disturbance: Secondary | ICD-10-CM | POA: Diagnosis not present

## 2016-09-14 DIAGNOSIS — J45909 Unspecified asthma, uncomplicated: Secondary | ICD-10-CM | POA: Diagnosis not present

## 2016-09-14 DIAGNOSIS — E1165 Type 2 diabetes mellitus with hyperglycemia: Secondary | ICD-10-CM | POA: Diagnosis not present

## 2016-09-14 DIAGNOSIS — I10 Essential (primary) hypertension: Secondary | ICD-10-CM | POA: Diagnosis not present

## 2016-09-14 NOTE — Telephone Encounter (Signed)
Per Dr. Clent RidgesFry okay and I [facility] with [facility] and gave this information.

## 2016-09-15 DIAGNOSIS — E1165 Type 2 diabetes mellitus with hyperglycemia: Secondary | ICD-10-CM | POA: Diagnosis not present

## 2016-09-15 DIAGNOSIS — I10 Essential (primary) hypertension: Secondary | ICD-10-CM | POA: Diagnosis not present

## 2016-09-15 DIAGNOSIS — J449 Chronic obstructive pulmonary disease, unspecified: Secondary | ICD-10-CM | POA: Diagnosis not present

## 2016-09-15 DIAGNOSIS — R1310 Dysphagia, unspecified: Secondary | ICD-10-CM | POA: Diagnosis not present

## 2016-09-15 DIAGNOSIS — J45909 Unspecified asthma, uncomplicated: Secondary | ICD-10-CM | POA: Diagnosis not present

## 2016-09-15 DIAGNOSIS — F0390 Unspecified dementia without behavioral disturbance: Secondary | ICD-10-CM | POA: Diagnosis not present

## 2016-09-16 DIAGNOSIS — J45909 Unspecified asthma, uncomplicated: Secondary | ICD-10-CM | POA: Diagnosis not present

## 2016-09-16 DIAGNOSIS — R1310 Dysphagia, unspecified: Secondary | ICD-10-CM | POA: Diagnosis not present

## 2016-09-16 DIAGNOSIS — I10 Essential (primary) hypertension: Secondary | ICD-10-CM | POA: Diagnosis not present

## 2016-09-16 DIAGNOSIS — F0390 Unspecified dementia without behavioral disturbance: Secondary | ICD-10-CM | POA: Diagnosis not present

## 2016-09-16 DIAGNOSIS — J449 Chronic obstructive pulmonary disease, unspecified: Secondary | ICD-10-CM | POA: Diagnosis not present

## 2016-09-16 DIAGNOSIS — E1165 Type 2 diabetes mellitus with hyperglycemia: Secondary | ICD-10-CM | POA: Diagnosis not present

## 2016-09-17 DIAGNOSIS — I10 Essential (primary) hypertension: Secondary | ICD-10-CM | POA: Diagnosis not present

## 2016-09-17 DIAGNOSIS — R1310 Dysphagia, unspecified: Secondary | ICD-10-CM | POA: Diagnosis not present

## 2016-09-17 DIAGNOSIS — F0390 Unspecified dementia without behavioral disturbance: Secondary | ICD-10-CM | POA: Diagnosis not present

## 2016-09-17 DIAGNOSIS — J45909 Unspecified asthma, uncomplicated: Secondary | ICD-10-CM | POA: Diagnosis not present

## 2016-09-17 DIAGNOSIS — J449 Chronic obstructive pulmonary disease, unspecified: Secondary | ICD-10-CM | POA: Diagnosis not present

## 2016-09-17 DIAGNOSIS — E1165 Type 2 diabetes mellitus with hyperglycemia: Secondary | ICD-10-CM | POA: Diagnosis not present

## 2016-09-20 DIAGNOSIS — I10 Essential (primary) hypertension: Secondary | ICD-10-CM | POA: Diagnosis not present

## 2016-09-20 DIAGNOSIS — J45909 Unspecified asthma, uncomplicated: Secondary | ICD-10-CM | POA: Diagnosis not present

## 2016-09-20 DIAGNOSIS — F0390 Unspecified dementia without behavioral disturbance: Secondary | ICD-10-CM | POA: Diagnosis not present

## 2016-09-20 DIAGNOSIS — J449 Chronic obstructive pulmonary disease, unspecified: Secondary | ICD-10-CM | POA: Diagnosis not present

## 2016-09-20 DIAGNOSIS — E1165 Type 2 diabetes mellitus with hyperglycemia: Secondary | ICD-10-CM | POA: Diagnosis not present

## 2016-09-20 DIAGNOSIS — R1310 Dysphagia, unspecified: Secondary | ICD-10-CM | POA: Diagnosis not present

## 2016-09-22 DIAGNOSIS — F0390 Unspecified dementia without behavioral disturbance: Secondary | ICD-10-CM | POA: Diagnosis not present

## 2016-09-22 DIAGNOSIS — E1165 Type 2 diabetes mellitus with hyperglycemia: Secondary | ICD-10-CM | POA: Diagnosis not present

## 2016-09-22 DIAGNOSIS — I10 Essential (primary) hypertension: Secondary | ICD-10-CM | POA: Diagnosis not present

## 2016-09-22 DIAGNOSIS — J45909 Unspecified asthma, uncomplicated: Secondary | ICD-10-CM | POA: Diagnosis not present

## 2016-09-22 DIAGNOSIS — R1310 Dysphagia, unspecified: Secondary | ICD-10-CM | POA: Diagnosis not present

## 2016-09-22 DIAGNOSIS — J449 Chronic obstructive pulmonary disease, unspecified: Secondary | ICD-10-CM | POA: Diagnosis not present

## 2016-09-23 DIAGNOSIS — I10 Essential (primary) hypertension: Secondary | ICD-10-CM | POA: Diagnosis not present

## 2016-09-23 DIAGNOSIS — E1165 Type 2 diabetes mellitus with hyperglycemia: Secondary | ICD-10-CM | POA: Diagnosis not present

## 2016-09-23 DIAGNOSIS — J449 Chronic obstructive pulmonary disease, unspecified: Secondary | ICD-10-CM | POA: Diagnosis not present

## 2016-09-23 DIAGNOSIS — R1310 Dysphagia, unspecified: Secondary | ICD-10-CM | POA: Diagnosis not present

## 2016-09-23 DIAGNOSIS — J45909 Unspecified asthma, uncomplicated: Secondary | ICD-10-CM | POA: Diagnosis not present

## 2016-09-23 DIAGNOSIS — F0390 Unspecified dementia without behavioral disturbance: Secondary | ICD-10-CM | POA: Diagnosis not present

## 2016-09-24 ENCOUNTER — Other Ambulatory Visit: Payer: Self-pay | Admitting: Family Medicine

## 2016-09-24 ENCOUNTER — Telehealth: Payer: Self-pay | Admitting: Family Medicine

## 2016-09-24 DIAGNOSIS — E876 Hypokalemia: Secondary | ICD-10-CM

## 2016-09-24 NOTE — Telephone Encounter (Signed)
Pt scheduled for lab work   

## 2016-09-24 NOTE — Telephone Encounter (Signed)
Pt would like to know if you only want the pt to do blood work or should she see the doctor as well.

## 2016-09-24 NOTE — Telephone Encounter (Signed)
I am not sure what labs she is taking about?

## 2016-09-24 NOTE — Telephone Encounter (Signed)
Per Dr. Clent RidgesFry order a BMET due to low potassium, time to recheck. I did put in a future.

## 2016-09-24 NOTE — Telephone Encounter (Signed)
Can you call pt to schedule lab appointment only?

## 2016-09-29 DIAGNOSIS — F0390 Unspecified dementia without behavioral disturbance: Secondary | ICD-10-CM | POA: Diagnosis not present

## 2016-09-29 DIAGNOSIS — E1165 Type 2 diabetes mellitus with hyperglycemia: Secondary | ICD-10-CM | POA: Diagnosis not present

## 2016-09-29 DIAGNOSIS — I10 Essential (primary) hypertension: Secondary | ICD-10-CM | POA: Diagnosis not present

## 2016-09-29 DIAGNOSIS — J449 Chronic obstructive pulmonary disease, unspecified: Secondary | ICD-10-CM | POA: Diagnosis not present

## 2016-09-29 DIAGNOSIS — J45909 Unspecified asthma, uncomplicated: Secondary | ICD-10-CM | POA: Diagnosis not present

## 2016-09-29 DIAGNOSIS — R1310 Dysphagia, unspecified: Secondary | ICD-10-CM | POA: Diagnosis not present

## 2016-10-01 ENCOUNTER — Other Ambulatory Visit: Payer: Self-pay | Admitting: Family Medicine

## 2016-10-03 DIAGNOSIS — Z794 Long term (current) use of insulin: Secondary | ICD-10-CM | POA: Diagnosis not present

## 2016-10-03 DIAGNOSIS — E11649 Type 2 diabetes mellitus with hypoglycemia without coma: Secondary | ICD-10-CM | POA: Diagnosis not present

## 2016-10-03 DIAGNOSIS — E1165 Type 2 diabetes mellitus with hyperglycemia: Secondary | ICD-10-CM | POA: Diagnosis not present

## 2016-10-03 DIAGNOSIS — E161 Other hypoglycemia: Secondary | ICD-10-CM | POA: Diagnosis not present

## 2016-10-03 DIAGNOSIS — R918 Other nonspecific abnormal finding of lung field: Secondary | ICD-10-CM | POA: Diagnosis not present

## 2016-10-03 DIAGNOSIS — R531 Weakness: Secondary | ICD-10-CM | POA: Diagnosis not present

## 2016-10-03 DIAGNOSIS — N3000 Acute cystitis without hematuria: Secondary | ICD-10-CM | POA: Diagnosis not present

## 2016-10-03 DIAGNOSIS — E162 Hypoglycemia, unspecified: Secondary | ICD-10-CM | POA: Diagnosis not present

## 2016-10-04 DIAGNOSIS — J45909 Unspecified asthma, uncomplicated: Secondary | ICD-10-CM | POA: Diagnosis not present

## 2016-10-04 DIAGNOSIS — F0390 Unspecified dementia without behavioral disturbance: Secondary | ICD-10-CM | POA: Diagnosis not present

## 2016-10-04 DIAGNOSIS — E1165 Type 2 diabetes mellitus with hyperglycemia: Secondary | ICD-10-CM | POA: Diagnosis not present

## 2016-10-04 DIAGNOSIS — J449 Chronic obstructive pulmonary disease, unspecified: Secondary | ICD-10-CM | POA: Diagnosis not present

## 2016-10-04 DIAGNOSIS — R1310 Dysphagia, unspecified: Secondary | ICD-10-CM | POA: Diagnosis not present

## 2016-10-04 DIAGNOSIS — I10 Essential (primary) hypertension: Secondary | ICD-10-CM | POA: Diagnosis not present

## 2016-10-07 ENCOUNTER — Other Ambulatory Visit: Payer: Self-pay | Admitting: Endocrinology

## 2016-10-07 DIAGNOSIS — F0390 Unspecified dementia without behavioral disturbance: Secondary | ICD-10-CM | POA: Diagnosis not present

## 2016-10-07 DIAGNOSIS — J45909 Unspecified asthma, uncomplicated: Secondary | ICD-10-CM | POA: Diagnosis not present

## 2016-10-07 DIAGNOSIS — R1310 Dysphagia, unspecified: Secondary | ICD-10-CM | POA: Diagnosis not present

## 2016-10-07 DIAGNOSIS — E1165 Type 2 diabetes mellitus with hyperglycemia: Secondary | ICD-10-CM | POA: Diagnosis not present

## 2016-10-07 DIAGNOSIS — I10 Essential (primary) hypertension: Secondary | ICD-10-CM | POA: Diagnosis not present

## 2016-10-07 DIAGNOSIS — J449 Chronic obstructive pulmonary disease, unspecified: Secondary | ICD-10-CM | POA: Diagnosis not present

## 2016-10-07 NOTE — Telephone Encounter (Signed)
Please refill x 1 Ov is due  

## 2016-10-11 ENCOUNTER — Other Ambulatory Visit (INDEPENDENT_AMBULATORY_CARE_PROVIDER_SITE_OTHER): Payer: Medicare Other

## 2016-10-11 ENCOUNTER — Telehealth: Payer: Self-pay | Admitting: Family Medicine

## 2016-10-11 DIAGNOSIS — E876 Hypokalemia: Secondary | ICD-10-CM

## 2016-10-11 LAB — BASIC METABOLIC PANEL
BUN: 17 mg/dL (ref 6–23)
CALCIUM: 9.5 mg/dL (ref 8.4–10.5)
CHLORIDE: 99 meq/L (ref 96–112)
CO2: 29 meq/L (ref 19–32)
CREATININE: 0.61 mg/dL (ref 0.40–1.20)
GFR: 106.89 mL/min (ref >60.00)
GLUCOSE: 374 mg/dL — AB (ref 70–99)
Potassium: 4.3 mEq/L (ref 3.5–5.1)
Sodium: 136 mEq/L (ref 135–145)

## 2016-10-11 MED ORDER — DIAZEPAM 5 MG PO TABS: 5.0000 mg | ORAL_TABLET | Freq: Four times a day (QID) | ORAL | 5 refills | Status: DC

## 2016-10-11 NOTE — Telephone Encounter (Signed)
I spoke with Isabel [name] and went over below information.

## 2016-10-11 NOTE — Telephone Encounter (Signed)
Patient has a spot on her nose from her cpap mask that her cousin Isabel [name]([name]) states she picks at keep it raw.  She has been keeping antibiotic ointment on it and wonders if she is using it on the spot too long.   Patient also needs a refill on her Diazepam.  Pharmacy: [facility] [place]

## 2016-10-11 NOTE — Telephone Encounter (Signed)
Call in Diazepam #90 with 5 rf. Also she can use the antibiotic ointment as long as she wants to. Try covering the spot with a Bandaid when she wears the mask

## 2016-10-11 NOTE — Telephone Encounter (Signed)
Isabel [name] would like to request more PT, patient just finished and want's to continue.

## 2016-10-11 NOTE — Telephone Encounter (Signed)
I called in script for Diazepam to below pharmacy and left a message for pt to return my call.

## 2016-10-12 NOTE — Telephone Encounter (Signed)
Per Dr. Clent RidgesFry we need to get the last report from PT and this would determine if pt needs more or is she is eligible.

## 2016-10-12 NOTE — Telephone Encounter (Signed)
I spoke with Rosann Auerbachrish and went over below message.

## 2016-10-15 ENCOUNTER — Telehealth: Payer: Self-pay | Admitting: Family Medicine

## 2016-10-15 DIAGNOSIS — R1310 Dysphagia, unspecified: Secondary | ICD-10-CM | POA: Diagnosis not present

## 2016-10-15 DIAGNOSIS — E1165 Type 2 diabetes mellitus with hyperglycemia: Secondary | ICD-10-CM | POA: Diagnosis not present

## 2016-10-15 DIAGNOSIS — J449 Chronic obstructive pulmonary disease, unspecified: Secondary | ICD-10-CM | POA: Diagnosis not present

## 2016-10-15 DIAGNOSIS — I10 Essential (primary) hypertension: Secondary | ICD-10-CM | POA: Diagnosis not present

## 2016-10-15 DIAGNOSIS — J45909 Unspecified asthma, uncomplicated: Secondary | ICD-10-CM | POA: Diagnosis not present

## 2016-10-15 DIAGNOSIS — F0390 Unspecified dementia without behavioral disturbance: Secondary | ICD-10-CM | POA: Diagnosis not present

## 2016-10-15 NOTE — Telephone Encounter (Signed)
Tiffany,RN was out seeing the pt today and notice a place on the bridge of the pts nose think it may have come from the CPAP machine that may have rubbed the place it is red and inflamed and think that the pt needs a antibiotic.  Verbal order is okay.

## 2016-10-15 NOTE — Telephone Encounter (Signed)
I spoke with Tiffany RN and advised to contact pulmonary office. Dr. Clent RidgesFry has already left for the day. Pt might need antibiotic and the mask to be evaluated, home nurse did agree to follow advice.

## 2016-10-18 DIAGNOSIS — J45909 Unspecified asthma, uncomplicated: Secondary | ICD-10-CM | POA: Diagnosis not present

## 2016-10-18 DIAGNOSIS — R1310 Dysphagia, unspecified: Secondary | ICD-10-CM | POA: Diagnosis not present

## 2016-10-18 DIAGNOSIS — J449 Chronic obstructive pulmonary disease, unspecified: Secondary | ICD-10-CM | POA: Diagnosis not present

## 2016-10-18 DIAGNOSIS — E1165 Type 2 diabetes mellitus with hyperglycemia: Secondary | ICD-10-CM | POA: Diagnosis not present

## 2016-10-18 DIAGNOSIS — F0390 Unspecified dementia without behavioral disturbance: Secondary | ICD-10-CM | POA: Diagnosis not present

## 2016-10-18 DIAGNOSIS — I10 Essential (primary) hypertension: Secondary | ICD-10-CM | POA: Diagnosis not present

## 2016-10-22 DIAGNOSIS — I10 Essential (primary) hypertension: Secondary | ICD-10-CM | POA: Diagnosis not present

## 2016-10-22 DIAGNOSIS — J45909 Unspecified asthma, uncomplicated: Secondary | ICD-10-CM | POA: Diagnosis not present

## 2016-10-22 DIAGNOSIS — J449 Chronic obstructive pulmonary disease, unspecified: Secondary | ICD-10-CM | POA: Diagnosis not present

## 2016-10-22 DIAGNOSIS — R1310 Dysphagia, unspecified: Secondary | ICD-10-CM | POA: Diagnosis not present

## 2016-10-22 DIAGNOSIS — F0390 Unspecified dementia without behavioral disturbance: Secondary | ICD-10-CM | POA: Diagnosis not present

## 2016-10-22 DIAGNOSIS — E1165 Type 2 diabetes mellitus with hyperglycemia: Secondary | ICD-10-CM | POA: Diagnosis not present

## 2016-10-27 DIAGNOSIS — F0390 Unspecified dementia without behavioral disturbance: Secondary | ICD-10-CM | POA: Diagnosis not present

## 2016-10-27 DIAGNOSIS — I10 Essential (primary) hypertension: Secondary | ICD-10-CM | POA: Diagnosis not present

## 2016-10-27 DIAGNOSIS — J449 Chronic obstructive pulmonary disease, unspecified: Secondary | ICD-10-CM | POA: Diagnosis not present

## 2016-10-27 DIAGNOSIS — F319 Bipolar disorder, unspecified: Secondary | ICD-10-CM | POA: Diagnosis not present

## 2016-10-27 DIAGNOSIS — E1165 Type 2 diabetes mellitus with hyperglycemia: Secondary | ICD-10-CM | POA: Diagnosis not present

## 2016-10-27 DIAGNOSIS — J45909 Unspecified asthma, uncomplicated: Secondary | ICD-10-CM | POA: Diagnosis not present

## 2016-11-03 ENCOUNTER — Other Ambulatory Visit: Payer: Self-pay | Admitting: Family Medicine

## 2016-11-03 DIAGNOSIS — I10 Essential (primary) hypertension: Secondary | ICD-10-CM | POA: Diagnosis not present

## 2016-11-03 DIAGNOSIS — F319 Bipolar disorder, unspecified: Secondary | ICD-10-CM | POA: Diagnosis not present

## 2016-11-03 DIAGNOSIS — J45909 Unspecified asthma, uncomplicated: Secondary | ICD-10-CM | POA: Diagnosis not present

## 2016-11-03 DIAGNOSIS — F0390 Unspecified dementia without behavioral disturbance: Secondary | ICD-10-CM | POA: Diagnosis not present

## 2016-11-03 DIAGNOSIS — E1165 Type 2 diabetes mellitus with hyperglycemia: Secondary | ICD-10-CM | POA: Diagnosis not present

## 2016-11-03 DIAGNOSIS — J449 Chronic obstructive pulmonary disease, unspecified: Secondary | ICD-10-CM | POA: Diagnosis not present

## 2016-11-08 DIAGNOSIS — E1165 Type 2 diabetes mellitus with hyperglycemia: Secondary | ICD-10-CM | POA: Diagnosis not present

## 2016-11-08 DIAGNOSIS — Z794 Long term (current) use of insulin: Secondary | ICD-10-CM

## 2016-11-08 DIAGNOSIS — F319 Bipolar disorder, unspecified: Secondary | ICD-10-CM

## 2016-11-08 DIAGNOSIS — I10 Essential (primary) hypertension: Secondary | ICD-10-CM | POA: Diagnosis not present

## 2016-11-08 DIAGNOSIS — Z8701 Personal history of pneumonia (recurrent): Secondary | ICD-10-CM

## 2016-11-08 DIAGNOSIS — J449 Chronic obstructive pulmonary disease, unspecified: Secondary | ICD-10-CM | POA: Diagnosis not present

## 2016-11-08 DIAGNOSIS — J45909 Unspecified asthma, uncomplicated: Secondary | ICD-10-CM | POA: Diagnosis not present

## 2016-11-08 DIAGNOSIS — F0390 Unspecified dementia without behavioral disturbance: Secondary | ICD-10-CM

## 2016-11-15 DIAGNOSIS — J449 Chronic obstructive pulmonary disease, unspecified: Secondary | ICD-10-CM | POA: Diagnosis not present

## 2016-11-15 DIAGNOSIS — E1165 Type 2 diabetes mellitus with hyperglycemia: Secondary | ICD-10-CM | POA: Diagnosis not present

## 2016-11-15 DIAGNOSIS — J45909 Unspecified asthma, uncomplicated: Secondary | ICD-10-CM | POA: Diagnosis not present

## 2016-11-15 DIAGNOSIS — F319 Bipolar disorder, unspecified: Secondary | ICD-10-CM | POA: Diagnosis not present

## 2016-11-15 DIAGNOSIS — I10 Essential (primary) hypertension: Secondary | ICD-10-CM | POA: Diagnosis not present

## 2016-11-15 DIAGNOSIS — F0390 Unspecified dementia without behavioral disturbance: Secondary | ICD-10-CM | POA: Diagnosis not present

## 2016-12-02 DIAGNOSIS — I10 Essential (primary) hypertension: Secondary | ICD-10-CM | POA: Diagnosis not present

## 2016-12-02 DIAGNOSIS — J45909 Unspecified asthma, uncomplicated: Secondary | ICD-10-CM | POA: Diagnosis not present

## 2016-12-02 DIAGNOSIS — J449 Chronic obstructive pulmonary disease, unspecified: Secondary | ICD-10-CM | POA: Diagnosis not present

## 2016-12-02 DIAGNOSIS — E1165 Type 2 diabetes mellitus with hyperglycemia: Secondary | ICD-10-CM | POA: Diagnosis not present

## 2016-12-02 DIAGNOSIS — F319 Bipolar disorder, unspecified: Secondary | ICD-10-CM | POA: Diagnosis not present

## 2016-12-02 DIAGNOSIS — F0390 Unspecified dementia without behavioral disturbance: Secondary | ICD-10-CM | POA: Diagnosis not present

## 2016-12-13 DIAGNOSIS — J449 Chronic obstructive pulmonary disease, unspecified: Secondary | ICD-10-CM | POA: Diagnosis not present

## 2016-12-13 DIAGNOSIS — F0390 Unspecified dementia without behavioral disturbance: Secondary | ICD-10-CM | POA: Diagnosis not present

## 2016-12-13 DIAGNOSIS — F319 Bipolar disorder, unspecified: Secondary | ICD-10-CM | POA: Diagnosis not present

## 2016-12-13 DIAGNOSIS — J45909 Unspecified asthma, uncomplicated: Secondary | ICD-10-CM | POA: Diagnosis not present

## 2016-12-13 DIAGNOSIS — E1165 Type 2 diabetes mellitus with hyperglycemia: Secondary | ICD-10-CM | POA: Diagnosis not present

## 2016-12-13 DIAGNOSIS — I10 Essential (primary) hypertension: Secondary | ICD-10-CM | POA: Diagnosis not present

## 2016-12-21 DIAGNOSIS — F0390 Unspecified dementia without behavioral disturbance: Secondary | ICD-10-CM | POA: Diagnosis not present

## 2016-12-21 DIAGNOSIS — J45909 Unspecified asthma, uncomplicated: Secondary | ICD-10-CM | POA: Diagnosis not present

## 2016-12-21 DIAGNOSIS — E1165 Type 2 diabetes mellitus with hyperglycemia: Secondary | ICD-10-CM | POA: Diagnosis not present

## 2016-12-21 DIAGNOSIS — F319 Bipolar disorder, unspecified: Secondary | ICD-10-CM | POA: Diagnosis not present

## 2016-12-21 DIAGNOSIS — J449 Chronic obstructive pulmonary disease, unspecified: Secondary | ICD-10-CM | POA: Diagnosis not present

## 2016-12-21 DIAGNOSIS — I10 Essential (primary) hypertension: Secondary | ICD-10-CM | POA: Diagnosis not present

## 2017-01-04 ENCOUNTER — Telehealth: Payer: Self-pay | Admitting: Family Medicine

## 2017-01-04 NOTE — Telephone Encounter (Signed)
I spoke with Rosann Auerbachrish and we are waiting on a fax from [facility], orders to sign and send back.

## 2017-01-04 NOTE — Telephone Encounter (Signed)
Vicenta AlySylvia trish would like a return call concerning paperwork for Tanya. [facility]rish [facility]

## 2017-01-11 DIAGNOSIS — F331 Major depressive disorder, recurrent, moderate: Secondary | ICD-10-CM | POA: Diagnosis not present

## 2017-02-01 ENCOUNTER — Telehealth: Payer: Self-pay | Admitting: Family Medicine

## 2017-02-01 ENCOUNTER — Telehealth: Payer: Self-pay

## 2017-02-01 NOTE — Telephone Encounter (Signed)
PA resubmitted via fax for brand name only. Awaiting response.

## 2017-02-01 NOTE — Telephone Encounter (Signed)
Received PA request from [facility] for Pristiq. PA submitted, approval already on file per insurance company. Form faxed back to pharmacy.

## 2017-02-01 NOTE — Telephone Encounter (Signed)
Noted  

## 2017-02-01 NOTE — Telephone Encounter (Signed)
Stanton KidneyDebra with [facility] would like Dr Clent RidgesFry to know they are discharging pt due to not having a face to face in a timely manner.  Pt is past due for the face to face, and they instructed pt several times she needed this appt.  Pt not seen since 09/2016 and that is past their parameters.  Wanted to let you know.

## 2017-02-03 NOTE — Telephone Encounter (Signed)
PA approved, form faxed back to pharmacy. 

## 2017-02-07 ENCOUNTER — Encounter: Payer: Self-pay | Admitting: Family Medicine

## 2017-02-07 ENCOUNTER — Ambulatory Visit (INDEPENDENT_AMBULATORY_CARE_PROVIDER_SITE_OTHER): Payer: Medicare Other | Admitting: Family Medicine

## 2017-02-07 VITALS — BP 124/98 | HR 86 | Temp 98.4°F | Ht 65.0 in | Wt 133.0 lb

## 2017-02-07 DIAGNOSIS — J4520 Mild intermittent asthma, uncomplicated: Secondary | ICD-10-CM | POA: Diagnosis not present

## 2017-02-07 DIAGNOSIS — F4310 Post-traumatic stress disorder, unspecified: Secondary | ICD-10-CM

## 2017-02-07 DIAGNOSIS — I10 Essential (primary) hypertension: Secondary | ICD-10-CM

## 2017-02-07 DIAGNOSIS — F324 Major depressive disorder, single episode, in partial remission: Secondary | ICD-10-CM

## 2017-02-07 DIAGNOSIS — E119 Type 2 diabetes mellitus without complications: Secondary | ICD-10-CM | POA: Diagnosis not present

## 2017-02-07 DIAGNOSIS — Z794 Long term (current) use of insulin: Secondary | ICD-10-CM

## 2017-02-07 LAB — BASIC METABOLIC PANEL
BUN: 17 mg/dL (ref 6–23)
CHLORIDE: 103 meq/L (ref 96–112)
CO2: 30 mEq/L (ref 19–32)
CREATININE: 0.6 mg/dL (ref 0.40–1.20)
Calcium: 8.8 mg/dL (ref 8.4–10.5)
GFR: 108.83 mL/min (ref >60.00)
Glucose, Bld: 176 mg/dL — ABNORMAL HIGH (ref 70–99)
POTASSIUM: 4 meq/L (ref 3.5–5.1)
Sodium: 140 mEq/L (ref 135–145)

## 2017-02-07 LAB — HEMOGLOBIN A1C: HEMOGLOBIN A1C: 6.5 % (ref 4.6–6.5)

## 2017-02-07 MED ORDER — ALBUTEROL SULFATE HFA 108 (90 BASE) MCG/ACT IN AERS: 2.0000 | INHALATION_SPRAY | RESPIRATORY_TRACT | 11 refills | Status: AC | PRN

## 2017-02-07 MED ORDER — BREO ELLIPTA 200-25 MCG/INH IN AEPB: 1.0000 | INHALATION_SPRAY | Freq: Every day | RESPIRATORY_TRACT | 11 refills | Status: AC

## 2017-02-07 MED ORDER — BUDESONIDE-FORMOTEROL FUMARATE 160-4.5 MCG/ACT IN AERO: INHALATION_SPRAY | RESPIRATORY_TRACT | 11 refills | Status: AC

## 2017-02-07 NOTE — Patient Instructions (Signed)
WE NOW OFFER   [facility] FAST TRACK!!!  SAME DAY Appointments for ACUTE CARE  Such as: Sprains, Injuries, cuts, abrasions, rashes, muscle pain, joint pain, back pain Colds, flu, sore throats, headache, allergies, cough, fever  Ear pain, sinus and eye infections Abdominal pain, nausea, vomiting, diarrhea, upset stomach Animal/insect bites  3 Easy Ways to Schedule: Walk-In Scheduling Call in scheduling Mychart Sign-up: [email or web address]         

## 2017-02-07 NOTE — Progress Notes (Signed)
   Subjective:    Patient ID: Isabel [name], female    DOB: 22-Jan-1956, 61 y.o.   MRN: [id number]  HPI Here with her cousin to follow up. She has been doing very well. She recovered completely from the pneumonia she had last winter, and she has gained 5 lbs of weight since then. Her BP is stable at home. Her diabetes is stable with fasting glucoses ranging from 70 to 140.    Review of Systems  Constitutional: Negative.   Respiratory: Negative.   Cardiovascular: Negative.   Neurological: Negative.   Psychiatric/Behavioral: Negative.        Objective:   Physical Exam  Constitutional: She is oriented to person, place, and time. She appears well-developed and well-nourished.  Neck: No thyromegaly present.  Cardiovascular: Normal rate, regular rhythm, normal heart sounds and intact distal pulses.   Pulmonary/Chest: Effort normal and breath sounds normal. No respiratory distress. She has no wheezes. She has no rales.  Neurological: She is alert and oriented to person, place, and time.  Psychiatric: She has a normal mood and affect. Her behavior is normal. Thought content normal.          Assessment & Plan:  She seems to be doing well. She is gaining weight and strength back after the bout with pneumonia. Her diabetes seems to be stable but she will have an A1c and BMET drawn today. Follow up with Dr. Everardo AllEllison. Her HTN is stable. Her asthma is stable.  Gershon CraneStephen Fry, MD

## 2017-02-07 NOTE — Progress Notes (Signed)
Pre visit review using our clinic review tool, if applicable. No additional management support is needed unless otherwise documented below in the visit note. 

## 2017-02-13 IMAGING — DX DG CHEST 1V PORT
2 series · 2 of 2 positions shown · non-contrast
Comparison: 08/19/2016

CLINICAL DATA: Acute respiratory failure

EXAM:
PORTABLE CHEST 1 VIEW

[chest ap (1 of 2)]
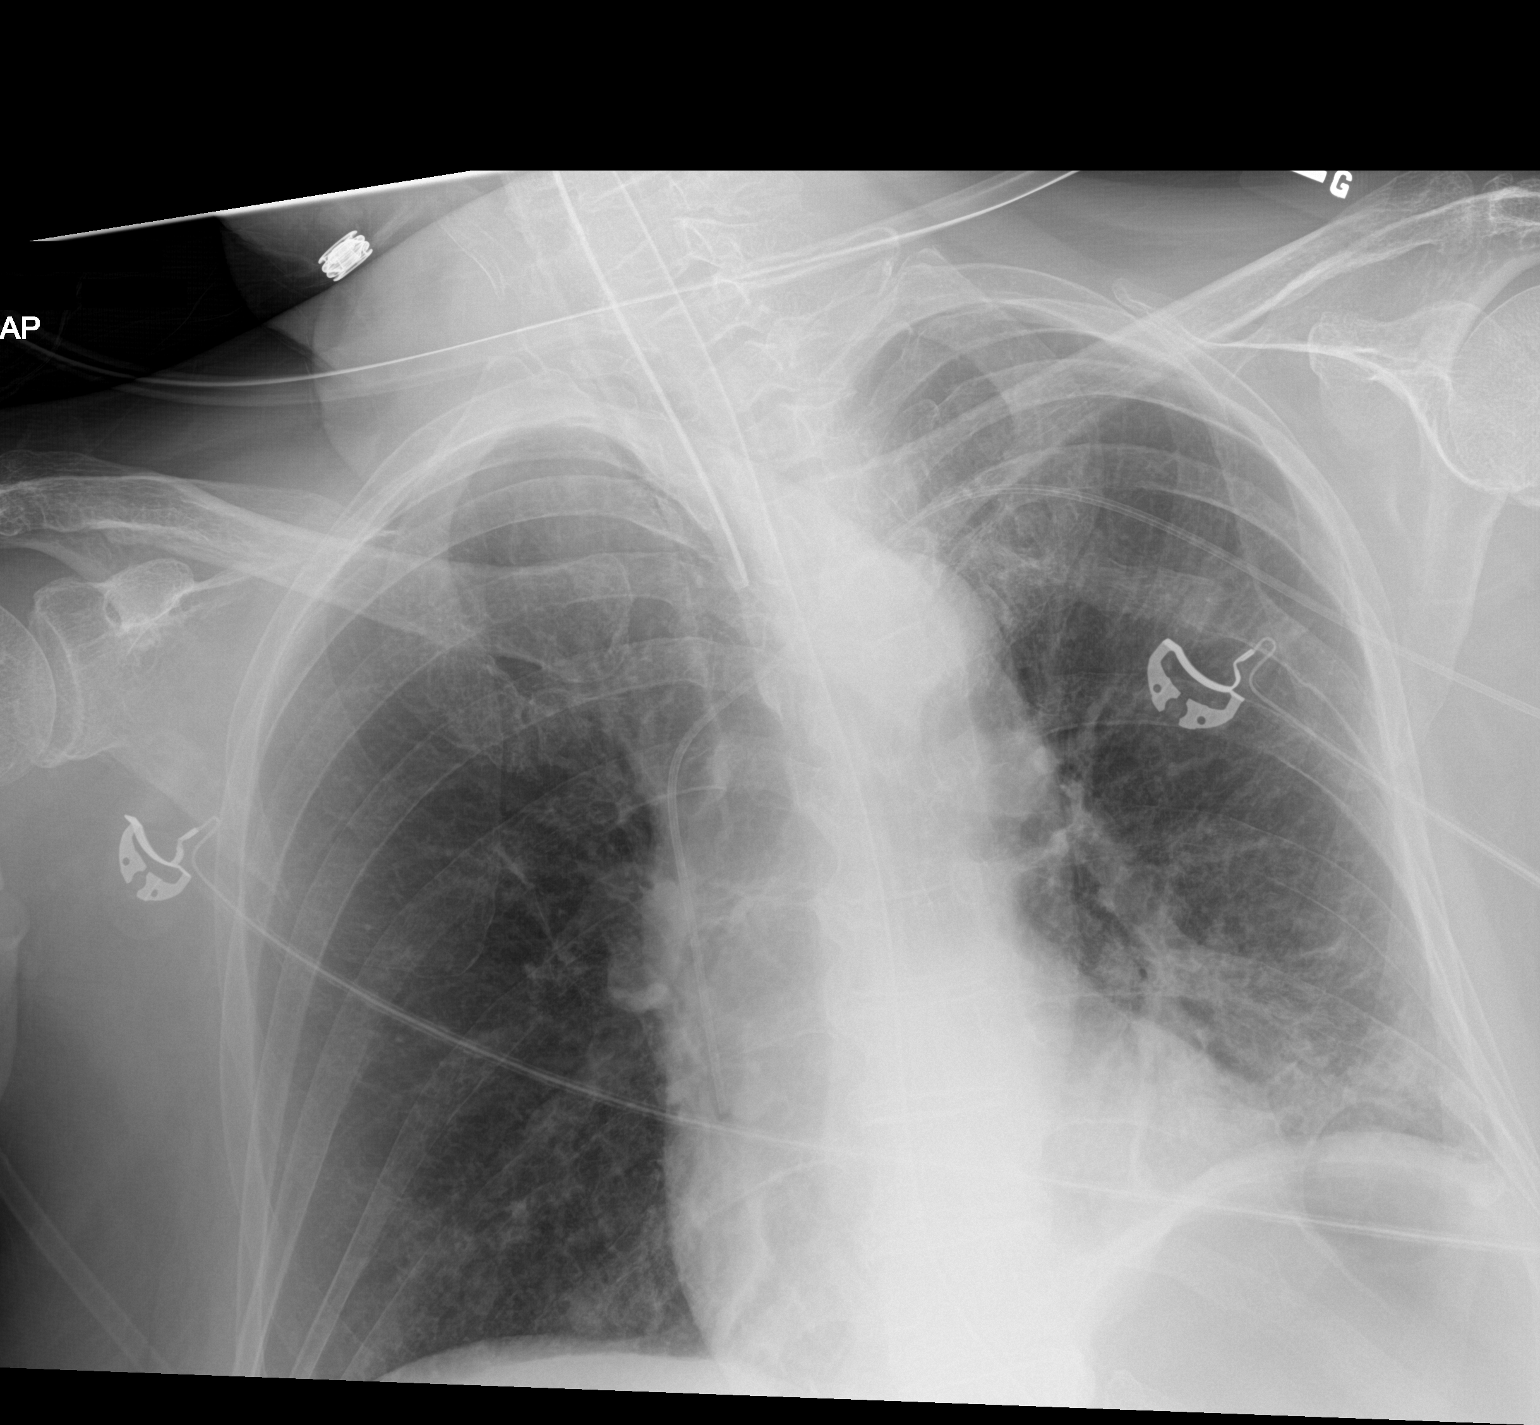

[chest ap (2 of 2)]
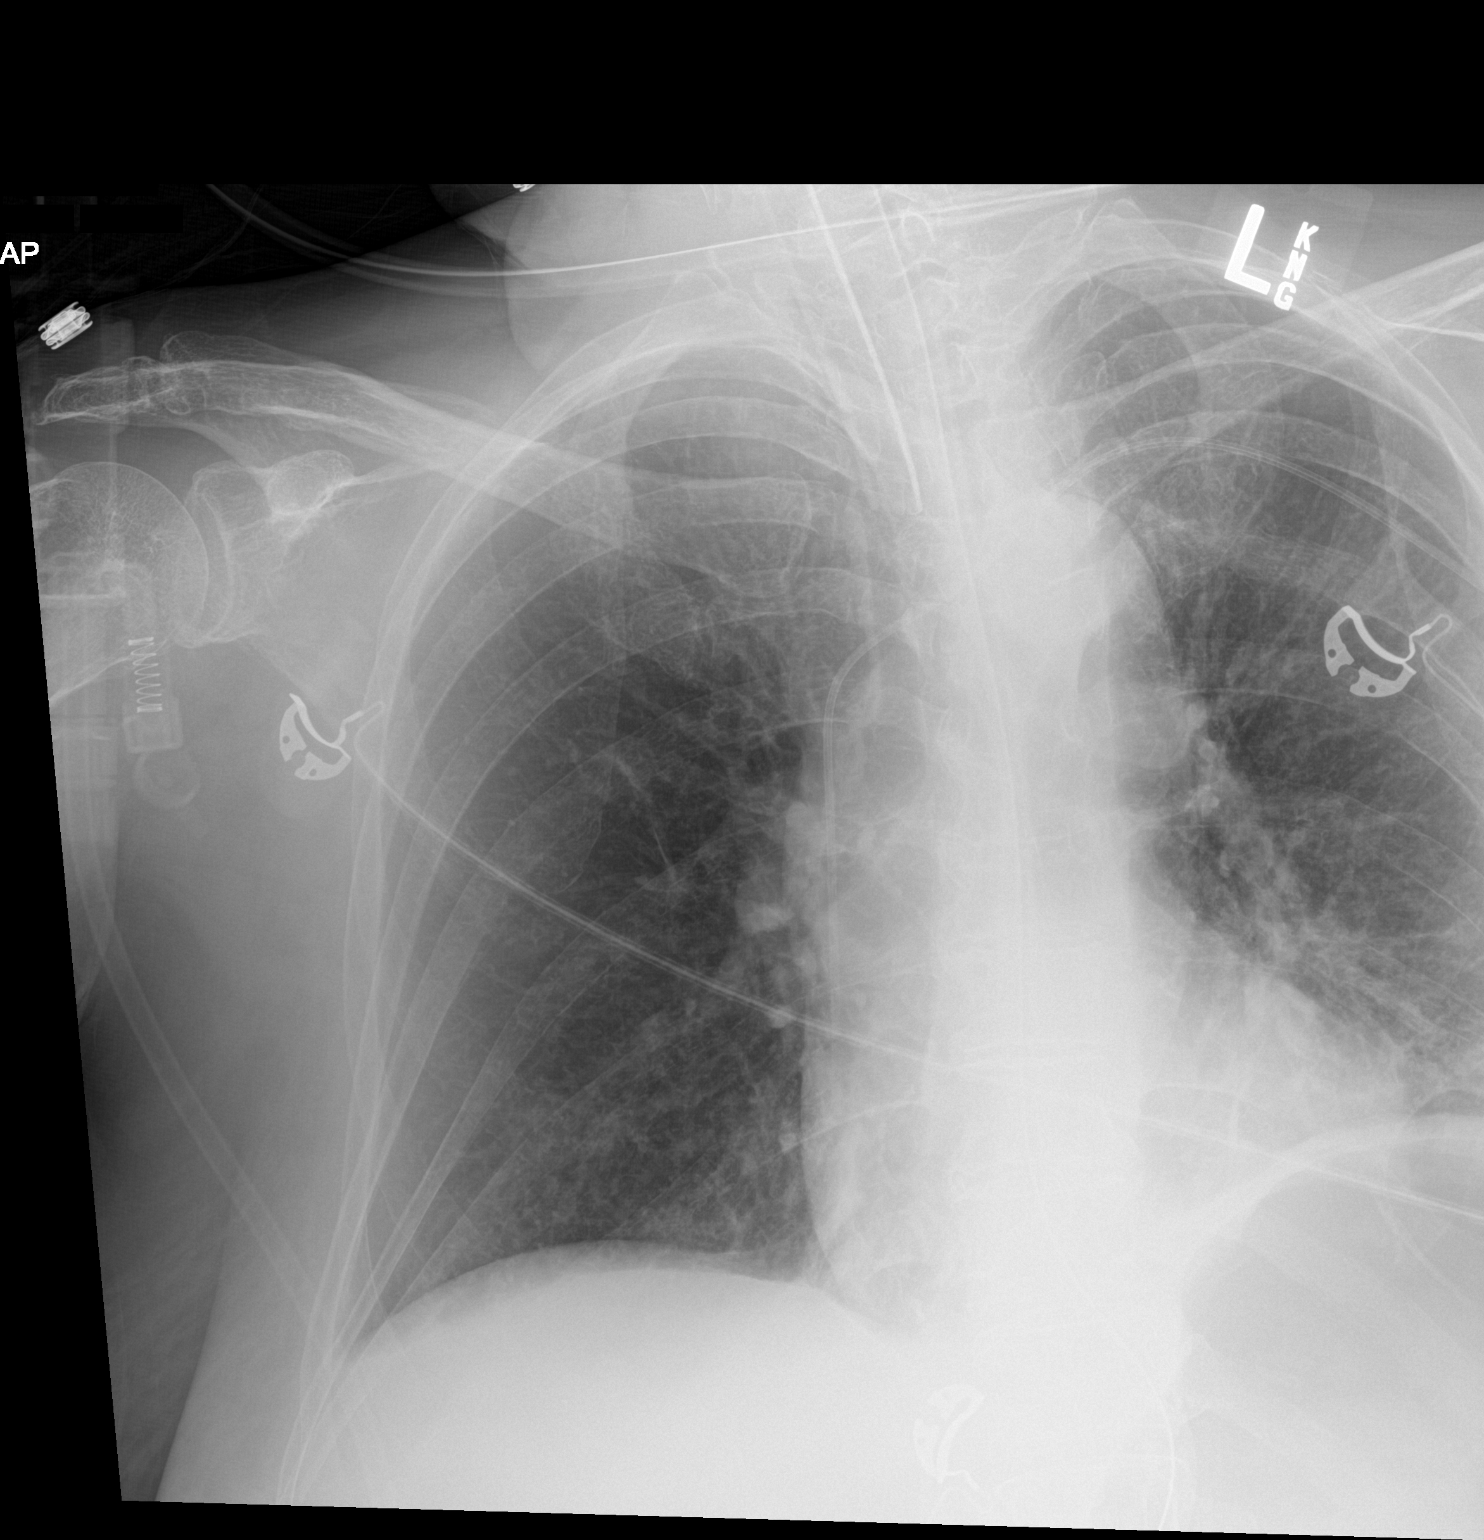

[2 of 2 positions shown; findings below may reference images not displayed]

FINDINGS: Endotracheal tube and NG tube unchanged. Introduction of LEFT
central venous line with tip in distal SVC. LEFT basilar atelectasis
and scarring not changed. No pulmonary edema. No infiltrate.
IMPRESSION: 1. Stable support apparatus.
2. Introduction of LEFT PICC line with tip in the distal SVC.
3. LEFT basilar atelectasis.

## 2017-02-15 IMAGING — DX DG CHEST 1V PORT
1 series · 1 of 1 positions shown · non-contrast
Comparison: 08/22/2016.

CLINICAL DATA: Hypoxia.

EXAM:
PORTABLE CHEST 1 VIEW

[chest ap]
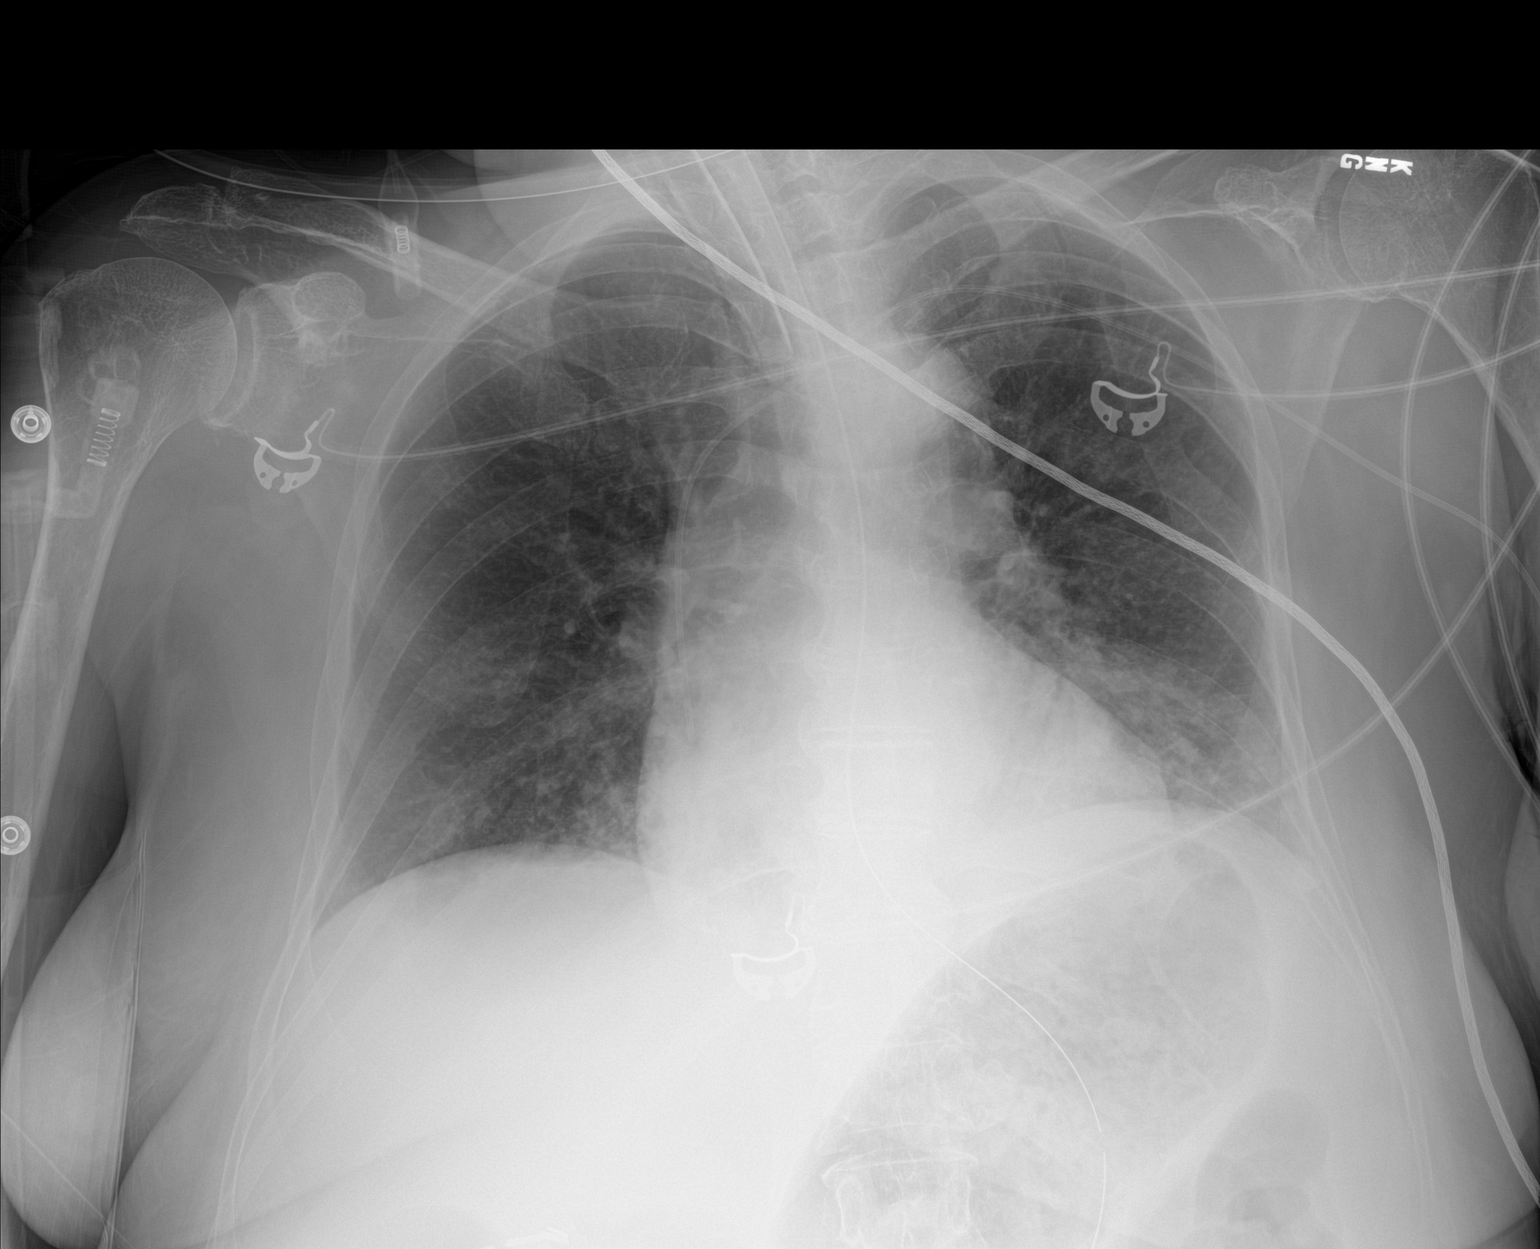

[1 of 1 positions shown; findings below may reference images not displayed]

FINDINGS: Endotracheal tube, NG tube, left PICC line stable position. Heart
size stable. Left lower lobe infiltrate suggesting pneumonia noted.
Low lung volumes with bibasilar atelectasis. No pleural effusion or
pneumothorax.
IMPRESSION: 1. Lines and tubes in stable position.
2. Left lower lobe infiltrate consistent with pneumonia.
3. Low lung volumes with bibasilar atelectasis.

## 2017-02-17 IMAGING — DX DG CHEST 1V PORT
1 series · 1 of 1 positions shown · non-contrast
Comparison: August 24, 2016

CLINICAL DATA: Respiratory failure

EXAM:
PORTABLE CHEST 1 VIEW

[chest ap]
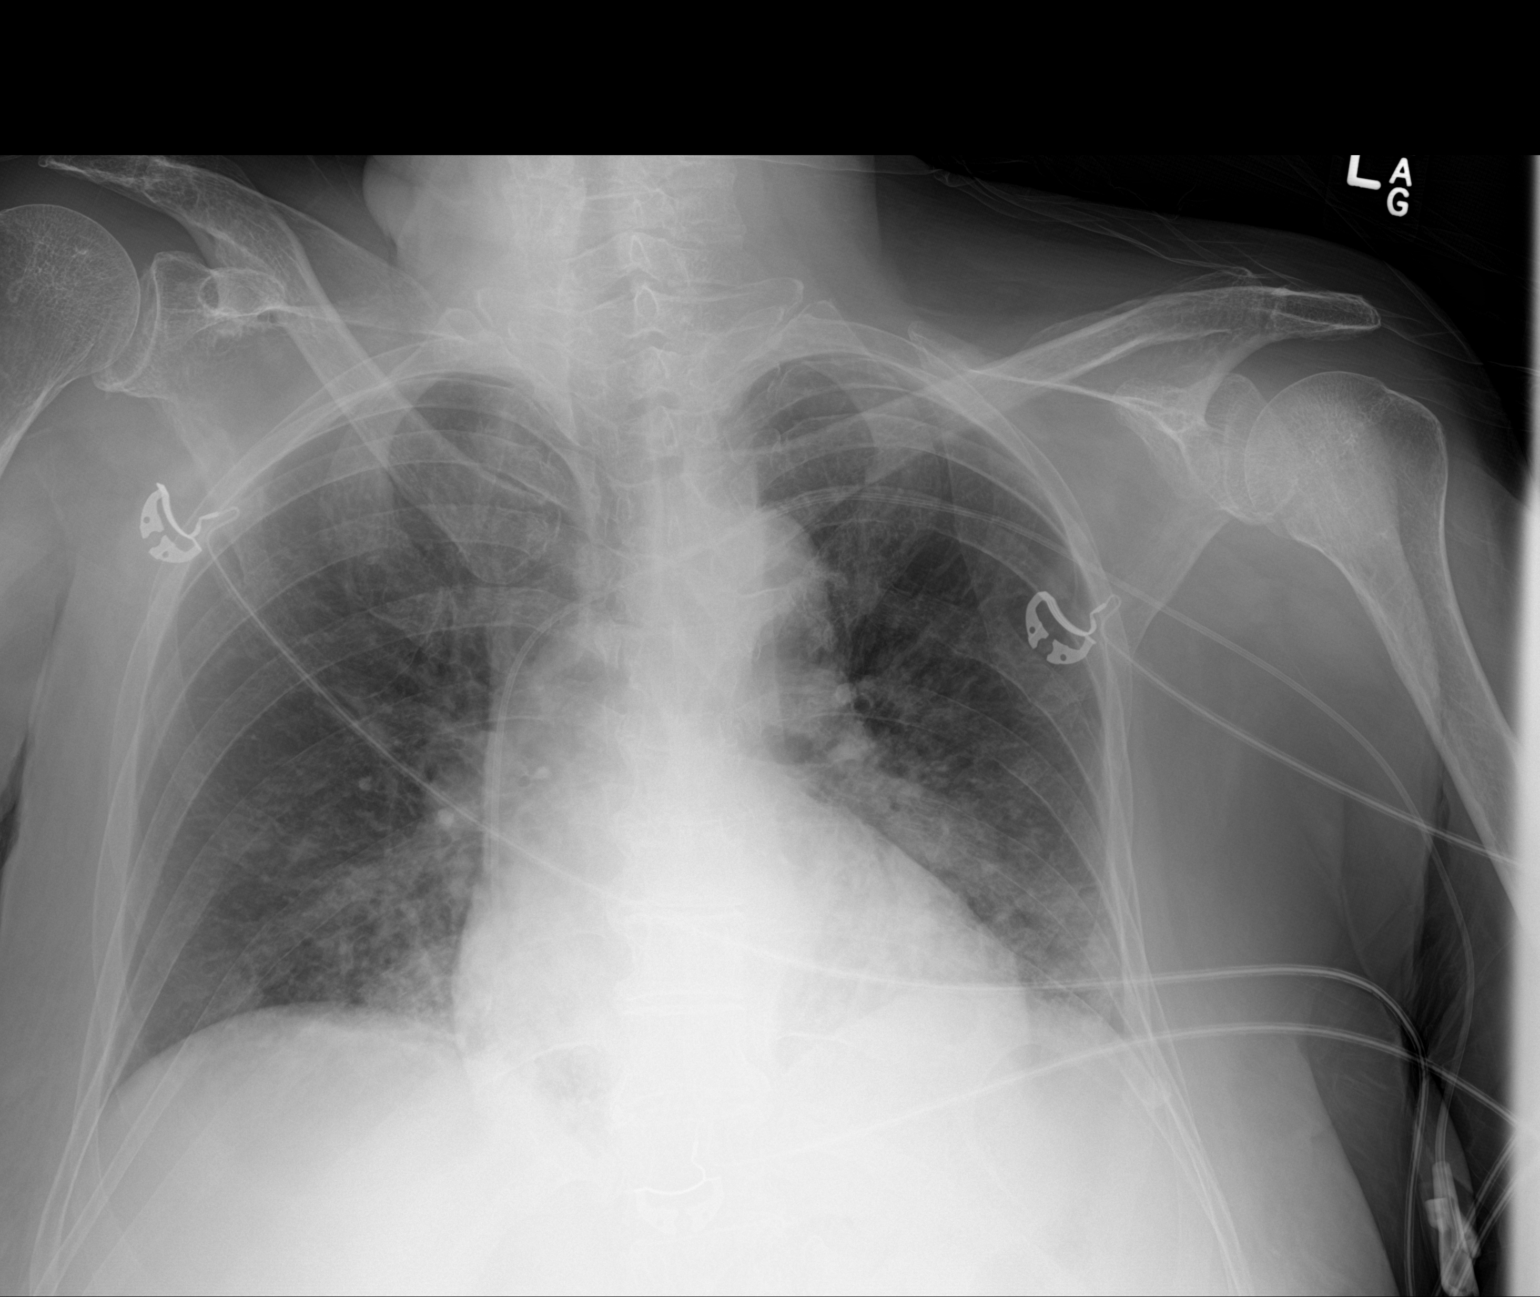

[1 of 1 positions shown; findings below may reference images not displayed]

FINDINGS: Endotracheal tube and nasogastric tube have been removed. Central
catheter tip is at the cavoatrial junction. No pneumothorax. There
is patchy opacity in the lower lung zones. Lungs elsewhere clear.
Heart is mildly enlarged with pulmonary vascularity within normal
limits.
IMPRESSION: Patchy airspace opacity in the lower lung zones persists without
change. Question pneumonia versus atypical pulmonary edema. No new
opacity. Stable cardiac silhouette. No pneumothorax.

## 2017-02-18 ENCOUNTER — Telehealth: Payer: Self-pay | Admitting: Endocrinology

## 2017-02-18 NOTE — Telephone Encounter (Signed)
[facility] called back about the CMN form, they stated that they just need this filled out so they are able to file the charges? Please advise.

## 2017-02-21 NOTE — Telephone Encounter (Signed)
Message not reviewed until 02/21/2017. CMN has been received and placed on hold. Form will be completed once patient comes in for an appointment.

## 2017-02-28 ENCOUNTER — Other Ambulatory Visit: Payer: Self-pay | Admitting: Family Medicine

## 2017-03-11 ENCOUNTER — Other Ambulatory Visit: Payer: Self-pay | Admitting: Family Medicine

## 2017-03-31 ENCOUNTER — Other Ambulatory Visit: Payer: Self-pay | Admitting: Family Medicine

## 2017-04-01 ENCOUNTER — Telehealth: Payer: Self-pay

## 2017-04-01 NOTE — Telephone Encounter (Signed)
Received PA request for Symbicort. PA submitted & is pending. Key: [id number]

## 2017-04-01 NOTE — Telephone Encounter (Signed)
Per Dr. [name] okay to refill Tofranil and I did send script e-scribe to [facility] pharmacy.

## 2017-04-04 NOTE — Telephone Encounter (Signed)
PA approved, form faxed back to pharmacy. 

## 2017-04-05 ENCOUNTER — Telehealth: Payer: Self-pay | Admitting: Endocrinology

## 2017-04-05 DIAGNOSIS — R7309 Other abnormal glucose: Secondary | ICD-10-CM | POA: Diagnosis not present

## 2017-04-05 DIAGNOSIS — Z87891 Personal history of nicotine dependence: Secondary | ICD-10-CM | POA: Diagnosis not present

## 2017-04-05 DIAGNOSIS — R1084 Generalized abdominal pain: Secondary | ICD-10-CM | POA: Diagnosis not present

## 2017-04-05 DIAGNOSIS — F329 Major depressive disorder, single episode, unspecified: Secondary | ICD-10-CM | POA: Diagnosis not present

## 2017-04-05 DIAGNOSIS — E890 Postprocedural hypothyroidism: Secondary | ICD-10-CM | POA: Diagnosis not present

## 2017-04-05 DIAGNOSIS — E1110 Type 2 diabetes mellitus with ketoacidosis without coma: Secondary | ICD-10-CM | POA: Diagnosis not present

## 2017-04-05 DIAGNOSIS — F0390 Unspecified dementia without behavioral disturbance: Secondary | ICD-10-CM | POA: Diagnosis not present

## 2017-04-05 DIAGNOSIS — F319 Bipolar disorder, unspecified: Secondary | ICD-10-CM | POA: Diagnosis not present

## 2017-04-05 DIAGNOSIS — J449 Chronic obstructive pulmonary disease, unspecified: Secondary | ICD-10-CM | POA: Diagnosis present

## 2017-04-05 DIAGNOSIS — K56699 Other intestinal obstruction unspecified as to partial versus complete obstruction: Secondary | ICD-10-CM | POA: Diagnosis not present

## 2017-04-05 DIAGNOSIS — R479 Unspecified speech disturbances: Secondary | ICD-10-CM | POA: Diagnosis not present

## 2017-04-05 DIAGNOSIS — Z7984 Long term (current) use of oral hypoglycemic drugs: Secondary | ICD-10-CM | POA: Diagnosis not present

## 2017-04-05 DIAGNOSIS — K311 Adult hypertrophic pyloric stenosis: Secondary | ICD-10-CM | POA: Diagnosis not present

## 2017-04-05 DIAGNOSIS — R112 Nausea with vomiting, unspecified: Secondary | ICD-10-CM | POA: Diagnosis not present

## 2017-04-05 DIAGNOSIS — Z79899 Other long term (current) drug therapy: Secondary | ICD-10-CM | POA: Diagnosis not present

## 2017-04-05 DIAGNOSIS — K219 Gastro-esophageal reflux disease without esophagitis: Secondary | ICD-10-CM | POA: Diagnosis not present

## 2017-04-05 DIAGNOSIS — E1310 Other specified diabetes mellitus with ketoacidosis without coma: Secondary | ICD-10-CM | POA: Diagnosis not present

## 2017-04-05 DIAGNOSIS — K56609 Unspecified intestinal obstruction, unspecified as to partial versus complete obstruction: Secondary | ICD-10-CM | POA: Diagnosis not present

## 2017-04-05 DIAGNOSIS — E1010 Type 1 diabetes mellitus with ketoacidosis without coma: Secondary | ICD-10-CM | POA: Diagnosis not present

## 2017-04-05 DIAGNOSIS — E1165 Type 2 diabetes mellitus with hyperglycemia: Secondary | ICD-10-CM | POA: Diagnosis not present

## 2017-04-05 DIAGNOSIS — D72829 Elevated white blood cell count, unspecified: Secondary | ICD-10-CM | POA: Diagnosis not present

## 2017-04-05 DIAGNOSIS — R531 Weakness: Secondary | ICD-10-CM | POA: Diagnosis not present

## 2017-04-05 LAB — CBC AND DIFFERENTIAL
HCT: 50 % — AB (ref 36–46)
Hemoglobin: 16.1 g/dL — AB (ref 12.0–16.0)
Neutrophils Absolute: 18 /uL
Platelets: 322 10*3/uL (ref 150–399)
WBC: 19.4 10*3/mL

## 2017-04-05 LAB — HEPATIC FUNCTION PANEL
ALT: 38 U/L — AB (ref 7–35)
AST: 34 U/L (ref 13–35)
Alkaline Phosphatase: 161 U/L — AB (ref 25–125)
Bilirubin, Total: 0.4 mg/dL

## 2017-04-05 LAB — POCT INR: INR: 1.2 — AB (ref 0.9–1.1)

## 2017-04-05 LAB — BASIC METABOLIC PANEL
BUN: 19 mg/dL (ref 4–21)
CREATININE: 0.7 mg/dL (ref 0.5–1.1)
GLUCOSE: 367 mg/dL
POTASSIUM: 4.3 mmol/L (ref 3.4–5.3)
SODIUM: 140 mmol/L (ref 137–147)

## 2017-04-05 NOTE — Telephone Encounter (Signed)
Patients medical adviser asked if patient could be transferred to [facility] from [facility].  Please advise

## 2017-04-06 NOTE — Telephone Encounter (Signed)
Spoke with Rosann Auerbach and she stated she needed one of her sisters doctors to approve a transfer for her to be moved to [facility] and I informed her that she needed to call the patients primary doctor

## 2017-04-06 NOTE — Telephone Encounter (Signed)
I am not the person to ask.  You should ask dr at your hospital

## 2017-04-07 ENCOUNTER — Telehealth: Payer: Self-pay

## 2017-04-07 NOTE — Telephone Encounter (Signed)
Patient's cousin, Ros[place]rish [place] called to state that [facility]. She is very concerned that pt is not receiving adequate care and that they are attempting to perform procedures (endoscopy) on pt with consent obtained while pt is not in her right state of mind. She is very concerned that if pt has procedure she "will not be able to swallow any more". 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 Advised her that I will speak with Dr. Clent RidgesFry for his recommendations.   Spoke with Dr. Clent RidgesFry. He agrees that with Claris CheHCPOA Trish has the ability to direct care for pt if she is not able to make her own medical decisions. He is not able to overstep physicians to intervene in pt's care.   Spoke with Trish and advised. She will speak with administration at the hospital for further assistance.  Nothing further needed at this time.

## 2017-04-12 ENCOUNTER — Encounter: Payer: Self-pay | Admitting: Family Medicine

## 2017-05-05 ENCOUNTER — Other Ambulatory Visit: Payer: Self-pay | Admitting: Family Medicine

## 2017-05-06 NOTE — Telephone Encounter (Signed)
Call in #90 with 5 rf 

## 2017-06-23 DIAGNOSIS — S82839A Other fracture of upper and lower end of unspecified fibula, initial encounter for closed fracture: Secondary | ICD-10-CM | POA: Diagnosis not present

## 2017-06-23 DIAGNOSIS — I4891 Unspecified atrial fibrillation: Secondary | ICD-10-CM | POA: Diagnosis not present

## 2017-06-23 DIAGNOSIS — S72001A Fracture of unspecified part of neck of right femur, initial encounter for closed fracture: Secondary | ICD-10-CM | POA: Diagnosis not present

## 2017-06-23 DIAGNOSIS — T391X1A Poisoning by 4-Aminophenol derivatives, accidental (unintentional), initial encounter: Secondary | ICD-10-CM | POA: Diagnosis not present

## 2017-06-23 DIAGNOSIS — Z452 Encounter for adjustment and management of vascular access device: Secondary | ICD-10-CM | POA: Diagnosis not present

## 2017-06-23 DIAGNOSIS — F0390 Unspecified dementia without behavioral disturbance: Secondary | ICD-10-CM | POA: Diagnosis present

## 2017-06-23 DIAGNOSIS — D689 Coagulation defect, unspecified: Secondary | ICD-10-CM | POA: Diagnosis not present

## 2017-06-23 DIAGNOSIS — G9341 Metabolic encephalopathy: Secondary | ICD-10-CM | POA: Diagnosis not present

## 2017-06-23 DIAGNOSIS — S82401A Unspecified fracture of shaft of right fibula, initial encounter for closed fracture: Secondary | ICD-10-CM | POA: Diagnosis not present

## 2017-06-23 DIAGNOSIS — A419 Sepsis, unspecified organism: Secondary | ICD-10-CM | POA: Diagnosis not present

## 2017-06-23 DIAGNOSIS — Z87891 Personal history of nicotine dependence: Secondary | ICD-10-CM | POA: Diagnosis not present

## 2017-06-23 DIAGNOSIS — J989 Respiratory disorder, unspecified: Secondary | ICD-10-CM | POA: Diagnosis not present

## 2017-06-23 DIAGNOSIS — R0602 Shortness of breath: Secondary | ICD-10-CM | POA: Diagnosis not present

## 2017-06-23 DIAGNOSIS — Z515 Encounter for palliative care: Secondary | ICD-10-CM | POA: Diagnosis not present

## 2017-06-23 DIAGNOSIS — E43 Unspecified severe protein-calorie malnutrition: Secondary | ICD-10-CM | POA: Diagnosis not present

## 2017-06-23 DIAGNOSIS — N390 Urinary tract infection, site not specified: Secondary | ICD-10-CM | POA: Diagnosis not present

## 2017-06-23 DIAGNOSIS — R680 Hypothermia, not associated with low environmental temperature: Secondary | ICD-10-CM | POA: Diagnosis present

## 2017-06-23 DIAGNOSIS — T68XXXA Hypothermia, initial encounter: Secondary | ICD-10-CM | POA: Diagnosis not present

## 2017-06-23 DIAGNOSIS — S72141A Displaced intertrochanteric fracture of right femur, initial encounter for closed fracture: Secondary | ICD-10-CM | POA: Diagnosis present

## 2017-06-23 DIAGNOSIS — J189 Pneumonia, unspecified organism: Secondary | ICD-10-CM | POA: Diagnosis not present

## 2017-06-23 DIAGNOSIS — Z794 Long term (current) use of insulin: Secondary | ICD-10-CM | POA: Diagnosis not present

## 2017-06-23 DIAGNOSIS — E876 Hypokalemia: Secondary | ICD-10-CM | POA: Diagnosis not present

## 2017-06-23 DIAGNOSIS — S36119A Unspecified injury of liver, initial encounter: Secondary | ICD-10-CM | POA: Diagnosis present

## 2017-06-23 DIAGNOSIS — F319 Bipolar disorder, unspecified: Secondary | ICD-10-CM | POA: Diagnosis not present

## 2017-06-23 DIAGNOSIS — N179 Acute kidney failure, unspecified: Secondary | ICD-10-CM | POA: Diagnosis not present

## 2017-06-23 DIAGNOSIS — N289 Disorder of kidney and ureter, unspecified: Secondary | ICD-10-CM | POA: Diagnosis not present

## 2017-06-23 DIAGNOSIS — E1011 Type 1 diabetes mellitus with ketoacidosis with coma: Secondary | ICD-10-CM | POA: Diagnosis not present

## 2017-06-23 DIAGNOSIS — S72144A Nondisplaced intertrochanteric fracture of right femur, initial encounter for closed fracture: Secondary | ICD-10-CM | POA: Diagnosis not present

## 2017-06-23 DIAGNOSIS — R4182 Altered mental status, unspecified: Secondary | ICD-10-CM | POA: Diagnosis not present

## 2017-06-23 DIAGNOSIS — R1110 Vomiting, unspecified: Secondary | ICD-10-CM | POA: Diagnosis not present

## 2017-06-23 DIAGNOSIS — S72041A Displaced fracture of base of neck of right femur, initial encounter for closed fracture: Secondary | ICD-10-CM | POA: Diagnosis not present

## 2017-06-23 DIAGNOSIS — E1010 Type 1 diabetes mellitus with ketoacidosis without coma: Secondary | ICD-10-CM | POA: Diagnosis not present

## 2017-06-23 DIAGNOSIS — K7200 Acute and subacute hepatic failure without coma: Secondary | ICD-10-CM | POA: Diagnosis not present

## 2017-06-23 DIAGNOSIS — R6521 Severe sepsis with septic shock: Secondary | ICD-10-CM | POA: Diagnosis not present

## 2017-06-23 DIAGNOSIS — R402441 Other coma, without documented Glasgow coma scale score, or with partial score reported, in the field [EMT or ambulance]: Secondary | ICD-10-CM | POA: Diagnosis not present

## 2017-06-23 DIAGNOSIS — R791 Abnormal coagulation profile: Secondary | ICD-10-CM | POA: Diagnosis not present

## 2017-06-23 DIAGNOSIS — Z66 Do not resuscitate: Secondary | ICD-10-CM | POA: Diagnosis not present

## 2017-06-23 DIAGNOSIS — E1310 Other specified diabetes mellitus with ketoacidosis without coma: Secondary | ICD-10-CM | POA: Diagnosis not present

## 2017-06-23 DIAGNOSIS — K219 Gastro-esophageal reflux disease without esophagitis: Secondary | ICD-10-CM | POA: Diagnosis present

## 2017-06-23 DIAGNOSIS — S7291XA Unspecified fracture of right femur, initial encounter for closed fracture: Secondary | ICD-10-CM | POA: Diagnosis not present

## 2017-06-23 DIAGNOSIS — R05 Cough: Secondary | ICD-10-CM | POA: Diagnosis not present

## 2017-06-23 DIAGNOSIS — T391X5A Adverse effect of 4-Aminophenol derivatives, initial encounter: Secondary | ICD-10-CM | POA: Diagnosis not present

## 2017-06-23 DIAGNOSIS — J449 Chronic obstructive pulmonary disease, unspecified: Secondary | ICD-10-CM | POA: Diagnosis present

## 2017-06-24 DIAGNOSIS — S82839A Other fracture of upper and lower end of unspecified fibula, initial encounter for closed fracture: Secondary | ICD-10-CM

## 2017-06-24 DIAGNOSIS — S7291XA Unspecified fracture of right femur, initial encounter for closed fracture: Secondary | ICD-10-CM

## 2017-06-26 DIAGNOSIS — J189 Pneumonia, unspecified organism: Secondary | ICD-10-CM

## 2017-06-26 DIAGNOSIS — N390 Urinary tract infection, site not specified: Secondary | ICD-10-CM

## 2017-06-26 DIAGNOSIS — I4891 Unspecified atrial fibrillation: Secondary | ICD-10-CM

## 2017-06-27 DIAGNOSIS — I4891 Unspecified atrial fibrillation: Secondary | ICD-10-CM

## 2017-06-28 DIAGNOSIS — I4891 Unspecified atrial fibrillation: Secondary | ICD-10-CM

## 2017-06-29 DIAGNOSIS — S72141A Displaced intertrochanteric fracture of right femur, initial encounter for closed fracture: Secondary | ICD-10-CM | POA: Diagnosis not present

## 2017-06-29 DIAGNOSIS — R4182 Altered mental status, unspecified: Secondary | ICD-10-CM

## 2017-06-29 DIAGNOSIS — F319 Bipolar disorder, unspecified: Secondary | ICD-10-CM

## 2017-06-29 DIAGNOSIS — I4891 Unspecified atrial fibrillation: Secondary | ICD-10-CM

## 2017-06-29 DIAGNOSIS — E1010 Type 1 diabetes mellitus with ketoacidosis without coma: Secondary | ICD-10-CM

## 2017-06-29 DIAGNOSIS — G9341 Metabolic encephalopathy: Secondary | ICD-10-CM

## 2017-06-29 DIAGNOSIS — J189 Pneumonia, unspecified organism: Secondary | ICD-10-CM

## 2017-06-29 DIAGNOSIS — N390 Urinary tract infection, site not specified: Secondary | ICD-10-CM

## 2017-06-29 DIAGNOSIS — J449 Chronic obstructive pulmonary disease, unspecified: Secondary | ICD-10-CM | POA: Diagnosis not present

## 2017-06-29 DIAGNOSIS — S7291XA Unspecified fracture of right femur, initial encounter for closed fracture: Secondary | ICD-10-CM

## 2017-06-29 DIAGNOSIS — E1310 Other specified diabetes mellitus with ketoacidosis without coma: Secondary | ICD-10-CM

## 2017-07-01 ENCOUNTER — Telehealth: Payer: Self-pay | Admitting: Family Medicine

## 2017-07-01 NOTE — Telephone Encounter (Signed)
I spoke to the hospitalist taking care of Isabel [name], and we agreed that she should be admitted to a rehab facility due to the broken hip.

## 2017-07-01 NOTE — Telephone Encounter (Signed)
Per pt cousin trish hospital will not give the patient her mentality medication and they are trying to put her in nursing home.

## 2017-07-02 DIAGNOSIS — I4891 Unspecified atrial fibrillation: Secondary | ICD-10-CM

## 2017-07-04 ENCOUNTER — Telehealth: Payer: Self-pay | Admitting: Endocrinology

## 2017-07-04 ENCOUNTER — Telehealth: Payer: Self-pay | Admitting: Family Medicine

## 2017-07-04 DIAGNOSIS — T391X1A Poisoning by 4-Aminophenol derivatives, accidental (unintentional), initial encounter: Secondary | ICD-10-CM

## 2017-07-04 DIAGNOSIS — A419 Sepsis, unspecified organism: Secondary | ICD-10-CM

## 2017-07-04 DIAGNOSIS — K7200 Acute and subacute hepatic failure without coma: Secondary | ICD-10-CM

## 2017-07-04 DIAGNOSIS — D689 Coagulation defect, unspecified: Secondary | ICD-10-CM

## 2017-07-04 NOTE — Telephone Encounter (Signed)
Isabel [name] would like to thank you for all you have done for pt. Pt passed today at 6:15 am

## 2017-07-04 NOTE — Telephone Encounter (Signed)
Dr. Fry is aware.  

## 2017-07-06 DEATH — deceased

## 2017-07-08 NOTE — Telephone Encounter (Signed)
error 

## 2017-08-25 ENCOUNTER — Encounter: Payer: Self-pay | Admitting: Family Medicine

## 2017-11-11 ENCOUNTER — Telehealth: Payer: Self-pay | Admitting: Family Medicine

## 2017-11-11 NOTE — Telephone Encounter (Signed)
Done paper work was faxed.

## 2017-11-11 NOTE — Telephone Encounter (Signed)
Copied from CRM [id number]#[id number]. Topic: Inquiry >> Nov 10, 2017  4:55 PM Alexander BergeronBarksdale, Harvey B wrote: Reason for CRM: Enrigue Catenaebbie Small from [facility] needs the encounter on 3.5.18 faxed to her, fax number: [phone], no other number was left
